# Patient Record
Sex: Male | Born: 1935 | Race: Black or African American | Hispanic: No | Marital: Single | Smoking: Former smoker
Health system: Southern US, Community
[De-identification: ages and names within clinical notes are randomized; demographics above are authoritative.]

## PROBLEM LIST (undated history)

## (undated) DIAGNOSIS — N189 Chronic kidney disease, unspecified: Secondary | ICD-10-CM

## (undated) DIAGNOSIS — I2510 Atherosclerotic heart disease of native coronary artery without angina pectoris: Secondary | ICD-10-CM

## (undated) DIAGNOSIS — R0602 Shortness of breath: Secondary | ICD-10-CM

## (undated) DIAGNOSIS — M109 Gout, unspecified: Secondary | ICD-10-CM

## (undated) DIAGNOSIS — R32 Unspecified urinary incontinence: Secondary | ICD-10-CM

## (undated) DIAGNOSIS — E119 Type 2 diabetes mellitus without complications: Secondary | ICD-10-CM

## (undated) DIAGNOSIS — J439 Emphysema, unspecified: Secondary | ICD-10-CM

## (undated) DIAGNOSIS — Z9981 Dependence on supplemental oxygen: Secondary | ICD-10-CM

## (undated) DIAGNOSIS — H409 Unspecified glaucoma: Secondary | ICD-10-CM

## (undated) DIAGNOSIS — I499 Cardiac arrhythmia, unspecified: Secondary | ICD-10-CM

## (undated) DIAGNOSIS — M1990 Unspecified osteoarthritis, unspecified site: Secondary | ICD-10-CM

## (undated) DIAGNOSIS — F1090 Alcohol use, unspecified, uncomplicated: Secondary | ICD-10-CM

## (undated) DIAGNOSIS — F0390 Unspecified dementia without behavioral disturbance: Secondary | ICD-10-CM

## (undated) DIAGNOSIS — Z7289 Other problems related to lifestyle: Secondary | ICD-10-CM

## (undated) DIAGNOSIS — N390 Urinary tract infection, site not specified: Secondary | ICD-10-CM

## (undated) DIAGNOSIS — I10 Essential (primary) hypertension: Secondary | ICD-10-CM

## (undated) DIAGNOSIS — E785 Hyperlipidemia, unspecified: Secondary | ICD-10-CM

## (undated) DIAGNOSIS — J189 Pneumonia, unspecified organism: Secondary | ICD-10-CM

## (undated) HISTORY — DX: Essential (primary) hypertension: I10

## (undated) HISTORY — DX: Unspecified osteoarthritis, unspecified site: M19.90

## (undated) HISTORY — DX: Emphysema, unspecified: J43.9

## (undated) HISTORY — DX: Other problems related to lifestyle: Z72.89

## (undated) HISTORY — DX: Alcohol use, unspecified, uncomplicated: F10.90

## (undated) HISTORY — DX: Unspecified urinary incontinence: R32

## (undated) HISTORY — DX: Hyperlipidemia, unspecified: E78.5

---

## 1992-05-31 HISTORY — PX: CORONARY ARTERY BYPASS GRAFT: SHX141

## 2011-01-07 ENCOUNTER — Emergency Department (HOSPITAL_COMMUNITY): Payer: MEDICARE

## 2011-01-07 ENCOUNTER — Emergency Department (HOSPITAL_COMMUNITY)
Admission: EM | Admit: 2011-01-07 | Discharge: 2011-01-07 | Disposition: A | Discharge status: Home / Self Care | Payer: MEDICARE | Attending: Emergency Medicine | Admitting: Emergency Medicine

## 2011-01-07 DIAGNOSIS — R29810 Facial weakness: Secondary | ICD-10-CM | POA: Insufficient documentation

## 2011-01-07 DIAGNOSIS — F0280 Dementia in other diseases classified elsewhere without behavioral disturbance: Secondary | ICD-10-CM | POA: Insufficient documentation

## 2011-01-07 DIAGNOSIS — E119 Type 2 diabetes mellitus without complications: Secondary | ICD-10-CM | POA: Insufficient documentation

## 2011-01-07 DIAGNOSIS — Z79899 Other long term (current) drug therapy: Secondary | ICD-10-CM | POA: Insufficient documentation

## 2011-01-07 DIAGNOSIS — R059 Cough, unspecified: Secondary | ICD-10-CM | POA: Insufficient documentation

## 2011-01-07 DIAGNOSIS — R05 Cough: Secondary | ICD-10-CM | POA: Insufficient documentation

## 2011-01-07 DIAGNOSIS — G309 Alzheimer's disease, unspecified: Secondary | ICD-10-CM | POA: Insufficient documentation

## 2011-01-07 DIAGNOSIS — I10 Essential (primary) hypertension: Secondary | ICD-10-CM | POA: Insufficient documentation

## 2011-01-07 DIAGNOSIS — I2510 Atherosclerotic heart disease of native coronary artery without angina pectoris: Secondary | ICD-10-CM | POA: Insufficient documentation

## 2011-01-07 DIAGNOSIS — G510 Bell's palsy: Secondary | ICD-10-CM | POA: Insufficient documentation

## 2011-01-07 LAB — DIFFERENTIAL
Basophils Relative: 0 % (ref 0–1)
Eosinophils Relative: 1 % (ref 0–5)
Monocytes Absolute: 1 10*3/uL (ref 0.1–1.0)
Monocytes Relative: 12 % (ref 3–12)
Neutro Abs: 5.5 10*3/uL (ref 1.7–7.7)

## 2011-01-07 LAB — POCT I-STAT, CHEM 8
Creatinine, Ser: 1.9 mg/dL — ABNORMAL HIGH (ref 0.50–1.35)
Glucose, Bld: 92 mg/dL (ref 70–99)
HCT: 37 % — ABNORMAL LOW (ref 39.0–52.0)
Hemoglobin: 12.6 g/dL — ABNORMAL LOW (ref 13.0–17.0)
Sodium: 137 mEq/L (ref 135–145)
TCO2: 23 mmol/L (ref 0–100)

## 2011-01-07 LAB — CBC
Hemoglobin: 11.2 g/dL — ABNORMAL LOW (ref 13.0–17.0)
MCH: 29.8 pg (ref 26.0–34.0)
MCHC: 33 g/dL (ref 30.0–36.0)
RDW: 14.3 % (ref 11.5–15.5)

## 2011-02-14 ENCOUNTER — Emergency Department (HOSPITAL_COMMUNITY): Payer: MEDICARE

## 2011-02-14 ENCOUNTER — Emergency Department (HOSPITAL_COMMUNITY)
Admission: EM | Admit: 2011-02-14 | Discharge: 2011-02-15 | Disposition: A | Discharge status: Home / Self Care | Payer: MEDICARE | Attending: Emergency Medicine | Admitting: Emergency Medicine

## 2011-02-14 DIAGNOSIS — I517 Cardiomegaly: Secondary | ICD-10-CM | POA: Insufficient documentation

## 2011-02-14 DIAGNOSIS — Z951 Presence of aortocoronary bypass graft: Secondary | ICD-10-CM | POA: Insufficient documentation

## 2011-02-14 DIAGNOSIS — E119 Type 2 diabetes mellitus without complications: Secondary | ICD-10-CM | POA: Insufficient documentation

## 2011-02-14 DIAGNOSIS — G309 Alzheimer's disease, unspecified: Secondary | ICD-10-CM | POA: Insufficient documentation

## 2011-02-14 DIAGNOSIS — I2510 Atherosclerotic heart disease of native coronary artery without angina pectoris: Secondary | ICD-10-CM | POA: Insufficient documentation

## 2011-02-14 DIAGNOSIS — F0280 Dementia in other diseases classified elsewhere without behavioral disturbance: Secondary | ICD-10-CM | POA: Insufficient documentation

## 2011-02-14 DIAGNOSIS — R062 Wheezing: Secondary | ICD-10-CM | POA: Insufficient documentation

## 2011-02-14 DIAGNOSIS — J42 Unspecified chronic bronchitis: Secondary | ICD-10-CM | POA: Insufficient documentation

## 2011-02-14 DIAGNOSIS — M7989 Other specified soft tissue disorders: Secondary | ICD-10-CM | POA: Insufficient documentation

## 2011-02-14 DIAGNOSIS — I10 Essential (primary) hypertension: Secondary | ICD-10-CM | POA: Insufficient documentation

## 2011-02-14 LAB — BASIC METABOLIC PANEL
CO2: 24 mEq/L (ref 19–32)
Chloride: 100 mEq/L (ref 96–112)
GFR calc Af Amer: >60 mL/min (ref >60)
Potassium: 4.2 mEq/L (ref 3.5–5.1)
Sodium: 134 mEq/L — ABNORMAL LOW (ref 135–145)

## 2011-02-14 LAB — DIFFERENTIAL
Basophils Absolute: 0.1 10*3/uL (ref 0.0–0.1)
Basophils Relative: 1 % (ref 0–1)
Eosinophils Absolute: 0.7 10*3/uL (ref 0.0–0.7)
Eosinophils Relative: 12 % — ABNORMAL HIGH (ref 0–5)
Lymphocytes Relative: 31 % (ref 12–46)
Monocytes Absolute: 0.6 10*3/uL (ref 0.1–1.0)
Monocytes Relative: 10 % (ref 3–12)
Neutro Abs: 2.8 10*3/uL (ref 1.7–7.7)

## 2011-02-14 LAB — CBC
Platelets: 221 10*3/uL (ref 150–400)
RBC: 3.57 MIL/uL — ABNORMAL LOW (ref 4.22–5.81)
RDW: 15 % (ref 11.5–15.5)
WBC: 6.1 10*3/uL (ref 4.0–10.5)

## 2011-02-14 LAB — PRO B NATRIURETIC PEPTIDE: Pro B Natriuretic peptide (BNP): 269 pg/mL (ref 0–450)

## 2011-02-27 ENCOUNTER — Inpatient Hospital Stay (INDEPENDENT_AMBULATORY_CARE_PROVIDER_SITE_OTHER)
Admission: RE | Admit: 2011-02-27 | Discharge: 2011-02-27 | Disposition: A | Discharge status: Home / Self Care | Payer: BC Managed Care – HMO | Source: Ambulatory Visit | Attending: Family Medicine | Admitting: Family Medicine

## 2011-02-27 DIAGNOSIS — J42 Unspecified chronic bronchitis: Secondary | ICD-10-CM

## 2011-04-30 ENCOUNTER — Ambulatory Visit (INDEPENDENT_AMBULATORY_CARE_PROVIDER_SITE_OTHER): Payer: BC Managed Care – HMO | Admitting: Family Medicine

## 2011-04-30 ENCOUNTER — Encounter: Payer: Self-pay | Admitting: Family Medicine

## 2011-04-30 DIAGNOSIS — I10 Essential (primary) hypertension: Secondary | ICD-10-CM

## 2011-04-30 DIAGNOSIS — E785 Hyperlipidemia, unspecified: Secondary | ICD-10-CM

## 2011-04-30 DIAGNOSIS — F0390 Unspecified dementia without behavioral disturbance: Secondary | ICD-10-CM

## 2011-04-30 DIAGNOSIS — I2510 Atherosclerotic heart disease of native coronary artery without angina pectoris: Secondary | ICD-10-CM

## 2011-04-30 DIAGNOSIS — M109 Gout, unspecified: Secondary | ICD-10-CM

## 2011-04-30 DIAGNOSIS — E119 Type 2 diabetes mellitus without complications: Secondary | ICD-10-CM

## 2011-04-30 MED ORDER — DONEPEZIL HCL 5 MG PO TABS: 5.0000 mg | ORAL_TABLET | Freq: Every day | ORAL | Status: DC

## 2011-04-30 NOTE — Progress Notes (Signed)
  Subjective:    Patient ID: Lucas Obrien, male    DOB: 1924/11/22, 77 y.o.   MRN: [id number]  HPI New to establish.  Previous MD- [facility] (fax [phone])  Hyperlipidemia- chronic problem, on Zocor 10mg .  Labs last checked in August.  No abd pain, N/V, myalgias.  Diabetes- chronic problem, on Metformin.  Last A1C 1 month ago.  Not checking sugars at home.  Denies symptomatic lows- no dizziness, shaky feelings.  No CP, SOB, HAs, visual changes, edema.  HTN- chronic problem, on Norvasc, Lopressor, Diovan HCT.  Well controlled today.  Asymptomatic.  CAD- s/p bypass in 1994.  Did not see Cards in [place].  Gout- chronic problem, on Allopurinol daily.  Rarely having flares.  Dementia- started on Namenda 1-2 months ago.  Son has noticed some improvement in memory.  This is why pt moved down here.  Had 1 episode of wandering- ended up in [place].  No longer driving.   Review of Systems For ROS see HPI     Objective:   Physical Exam  Vitals reviewed. Constitutional: He appears well-developed and well-nourished. No distress.  HENT:  Head: Normocephalic and atraumatic.  Eyes: Conjunctivae and EOM are normal. Pupils are equal, round, and reactive to light.  Neck: Normal range of motion. Neck supple. No thyromegaly present.  Cardiovascular: Normal rate, regular rhythm, normal heart sounds and intact distal pulses.   Pulmonary/Chest: Effort normal and breath sounds normal. No respiratory distress.  Abdominal: Soft. Bowel sounds are normal. He exhibits no distension.  Musculoskeletal: He exhibits no edema.  Lymphadenopathy:    He has no cervical adenopathy.  Neurological: He is alert. No cranial nerve deficit. Coordination normal.  Skin: Skin is warm and dry.  Psychiatric: He has a normal mood and affect. His behavior is normal.          Assessment & Plan:

## 2011-04-30 NOTE — Patient Instructions (Signed)
Follow up in 2 months to recheck diabetes and cholesterol We'll review your records and call you if there are any changes Start the Aricept daily for the memory Call with any questions or concerns Welcome!  We're glad to have you!!! Happy Holidays!!!

## 2011-05-07 ENCOUNTER — Telehealth: Payer: Self-pay | Admitting: Family Medicine

## 2011-05-09 DIAGNOSIS — E785 Hyperlipidemia, unspecified: Secondary | ICD-10-CM | POA: Insufficient documentation

## 2011-05-09 DIAGNOSIS — I10 Essential (primary) hypertension: Secondary | ICD-10-CM | POA: Insufficient documentation

## 2011-05-09 DIAGNOSIS — F0390 Unspecified dementia without behavioral disturbance: Secondary | ICD-10-CM | POA: Insufficient documentation

## 2011-05-09 DIAGNOSIS — M109 Gout, unspecified: Secondary | ICD-10-CM | POA: Insufficient documentation

## 2011-05-09 DIAGNOSIS — E1129 Type 2 diabetes mellitus with other diabetic kidney complication: Secondary | ICD-10-CM | POA: Insufficient documentation

## 2011-05-09 DIAGNOSIS — I2510 Atherosclerotic heart disease of native coronary artery without angina pectoris: Secondary | ICD-10-CM | POA: Insufficient documentation

## 2011-05-09 NOTE — Assessment & Plan Note (Signed)
Chronic problem.  On Allopurinol daily as preventative med.  Reports infrequent flares.

## 2011-05-09 NOTE — Assessment & Plan Note (Signed)
Relatively new problem for pt.  Recently started on Aricept and Namenda.  Episode of wandering is what prompted move.  Pt is no longer driving.  wil follow closely.

## 2011-05-09 NOTE — Assessment & Plan Note (Signed)
Chronic problem.  Pt had not been seeing cards in [place].  Will await records from previous PCP and determine whether pt needs referral.

## 2011-05-09 NOTE — Assessment & Plan Note (Signed)
Chronic problem.  Adequate control today.  Asymptomatic.  No changes to med regimen.

## 2011-05-09 NOTE — Assessment & Plan Note (Signed)
Chronic problem for pt.  On statin.  Tolerating w/out difficulty.  Will get labs at next appt and await records for review.

## 2011-05-09 NOTE — Assessment & Plan Note (Signed)
Family reports labs done 1 month ago.  Uncertain as to level of control.  Will need to get labs and adjust meds if needed.

## 2011-05-11 NOTE — Telephone Encounter (Signed)
i need problem list, med list, records of any colonoscopy, labs for the last year, EKG tracing, any pertinent imaging, and office notes for last year.

## 2011-05-11 NOTE — Telephone Encounter (Signed)
Last OV 04-30-11.

## 2011-05-12 NOTE — Telephone Encounter (Signed)
Called office to speak with fran, noted after hours took my message of information needed to be faxed for pt.advised to call office if any questions noted

## 2011-05-19 ENCOUNTER — Ambulatory Visit (INDEPENDENT_AMBULATORY_CARE_PROVIDER_SITE_OTHER): Payer: BC Managed Care – HMO | Admitting: Family Medicine

## 2011-05-19 ENCOUNTER — Encounter: Payer: Self-pay | Admitting: Family Medicine

## 2011-05-19 DIAGNOSIS — R0982 Postnasal drip: Secondary | ICD-10-CM | POA: Insufficient documentation

## 2011-05-19 NOTE — Progress Notes (Signed)
  Subjective:    Patient ID: Lucas Obrien, male    DOB: 01-Mar-1925, 77 y.o.   MRN: [id number]  HPI Need to clear throat- reports constant need to clear throat and cough.  Sputum is white.  No fevers.  sxs started 1 week ago.  + nasal congestion.  No sinus pain.  No ear pain.  + PND.  Using prescription nasal steroid.   Review of Systems For ROS see HPI     Objective:   Physical Exam  Vitals reviewed. Constitutional: He appears well-developed and well-nourished. No distress.  HENT:  Head: Normocephalic and atraumatic.       No TTP over sinuses + turbinate edema + PND TMs normal bilaterally  Eyes: Conjunctivae and EOM are normal. Pupils are equal, round, and reactive to light.  Neck: Normal range of motion. Neck supple.  Cardiovascular: Normal rate, regular rhythm and normal heart sounds.   Pulmonary/Chest: Effort normal and breath sounds normal. No respiratory distress. He has no wheezes.  Lymphadenopathy:    He has no cervical adenopathy.  Skin: Skin is warm and dry.          Assessment & Plan:

## 2011-05-19 NOTE — Patient Instructions (Signed)
Your cough and need to clear your throat is due to your post-nasal drip Continue the nasal spray- 2 sprays each nostril Drink plenty of fluids Add Claritin or Zyrtec daily to decrease congestion Start Mucinex to thin your congestion and make it easier to cough up Call with any questions or concerns Hang in there! Happy Holidays!

## 2011-06-06 NOTE — Assessment & Plan Note (Signed)
No evidence of infxn.  Pt's sxs and PE most consistent w/ PND.  Continue nasal steroid spray.  Add OTC antihistamine.  Reviewed supportive care and red flags that should prompt return.  Pt and son expressed understanding and are in agreement w/ plan.

## 2011-06-08 ENCOUNTER — Telehealth: Payer: Self-pay

## 2011-06-08 NOTE — Telephone Encounter (Signed)
Triage VM:  Pt called requesting appt. tomorrow for wheezing and cold symptoms.  Pt advised to call in the morning to make appt for same day appt.

## 2011-06-09 ENCOUNTER — Encounter: Payer: Self-pay | Admitting: Internal Medicine

## 2011-06-09 ENCOUNTER — Ambulatory Visit (INDEPENDENT_AMBULATORY_CARE_PROVIDER_SITE_OTHER): Payer: BC Managed Care – HMO | Admitting: Internal Medicine

## 2011-06-09 DIAGNOSIS — J209 Acute bronchitis, unspecified: Secondary | ICD-10-CM

## 2011-06-09 DIAGNOSIS — E119 Type 2 diabetes mellitus without complications: Secondary | ICD-10-CM

## 2011-06-09 LAB — HEMOGLOBIN A1C: Hgb A1c MFr Bld: 6.7 % — ABNORMAL HIGH (ref 4.6–6.5)

## 2011-06-09 MED ORDER — AZITHROMYCIN 250 MG PO TABS: ORAL_TABLET | ORAL | Status: AC

## 2011-06-09 MED ORDER — HYDROCODONE-HOMATROPINE 5-1.5 MG/5ML PO SYRP: 5.0000 mL | ORAL_SOLUTION | Freq: Four times a day (QID) | ORAL | Status: AC | PRN

## 2011-06-09 NOTE — Patient Instructions (Signed)
Goals for home glucose monitoring are : fasting  or morning glucose goal of  90-150. Two hours after any meal , goal = < 180, preferably < 160. Report any low blood glucoses immediately.  

## 2011-06-09 NOTE — Progress Notes (Signed)
  Subjective:    Patient ID: Lucas Obrien, male    DOB: [birth year], 78 y.o.   MRN: [id number]  HPI Respiratory tract infection Onset/symptoms:as dry cough 1/3 Exposures (illness/environmental/extrinsic):son ill Progression of symptoms:rhinitis & persistent cough Treatments/response:Cheratussin , nasal spray Present symptoms: Fever/chills/sweats:no Frontal headache:no Facial pain:no Nasal purulence:no Sore throat:no Wheezing/shortness of breath:wheezing 1/8, better with Symbicort Cough/sputum/hemoptysis:no sputum or blood  Past medical history: bronchitis Smoking history:quit in 1970s           Review of Systems glucoses are not be monitored at home; the last glucose his son remembers was 120 in December     Objective:   Physical Exam General appearance:well nourished; no acute distress or increased work of breathing is present.  No  lymphadenopathy about the head, neck, or axilla noted.   Eyes: No conjunctival inflammation or lid edema is present. .  Ears:  External ear exam shows no significant lesions or deformities.  Otoscopic examination reveals wax bilaterally.  Nose:  External nasal examination shows no deformity or inflammation. Nasal mucosa are pink and moist without lesions or exudates. No septal dislocation or deviation.No obstruction to airflow.   Oral exam: Dental caries present ; lips and gums are healthy appearing.There is no oropharyngeal erythema or exudate noted.      Heart:  Normal rate and regular rhythm. S1 and S2 normal without gallop, murmur, click, rub S 4 Lungs: Coarse low-grade rhonchi are noted diffusely; no rubs present Extremities:  No cyanosis or clubbing  noted . Trace edema at the sock line   Skin: Warm & dry        Assessment & Plan:  #1 bronchitis, asthmatic  #2 diabetes questionable status.  Plan/discussion: Steroids should be avoided until diabetic status can be determined. Symbicort will be increased to 2 puffs every 12  hours. Antibiotics and cough syrup will be prescribed.

## 2011-06-30 ENCOUNTER — Encounter: Payer: Self-pay | Admitting: Family Medicine

## 2011-06-30 ENCOUNTER — Ambulatory Visit (INDEPENDENT_AMBULATORY_CARE_PROVIDER_SITE_OTHER): Payer: Self-pay | Admitting: Family Medicine

## 2011-06-30 DIAGNOSIS — E119 Type 2 diabetes mellitus without complications: Secondary | ICD-10-CM

## 2011-06-30 DIAGNOSIS — E785 Hyperlipidemia, unspecified: Secondary | ICD-10-CM

## 2011-06-30 DIAGNOSIS — I10 Essential (primary) hypertension: Secondary | ICD-10-CM

## 2011-06-30 LAB — LIPID PANEL
Cholesterol: 140 mg/dL (ref 0–200)
HDL: 52.9 mg/dL (ref >39.00)
LDL Cholesterol: 73 mg/dL (ref 0–99)
VLDL: 14 mg/dL (ref 0.0–40.0)

## 2011-06-30 LAB — HEPATIC FUNCTION PANEL
AST: 28 U/L (ref 0–37)
Albumin: 4.1 g/dL (ref 3.5–5.2)
Alkaline Phosphatase: 46 U/L (ref 39–117)
Total Bilirubin: 0.3 mg/dL (ref 0.3–1.2)

## 2011-06-30 LAB — HEMOGLOBIN A1C: Hgb A1c MFr Bld: 6.7 % — ABNORMAL HIGH (ref 4.6–6.5)

## 2011-06-30 LAB — BASIC METABOLIC PANEL
Chloride: 109 mEq/L (ref 96–112)
GFR: 58.19 mL/min — ABNORMAL LOW (ref >60.00)
Potassium: 4.9 mEq/L (ref 3.5–5.1)

## 2011-06-30 NOTE — Progress Notes (Signed)
  Subjective:    Patient ID: Lucas Obrien, male    DOB: 1925-04-15, 78 y.o.   MRN: [id number]  HPI DM- chronic problem, on Metformin.  Not checking CBGs.  UTD on eye exam.  No numbness or tingling of hands or feet.  No symptomatic lows- no shaking or dizziness.  HTN- chronic problem, well controlled today.  On metoprolol, Diovan HCT, amlodipine.  No CP, SOB, HAs, visual changes, edema.  Hyperlipidemia- chronic problem, on Zocor.  No abd pain, N/V, myalgias.   Review of Systems For ROS see HPI     Objective:   Physical Exam  Vitals reviewed. Constitutional: He is oriented to person, place, and time. He appears well-developed and well-nourished. No distress.  HENT:  Head: Normocephalic and atraumatic.  Eyes: Conjunctivae and EOM are normal. Pupils are equal, round, and reactive to light.  Neck: Normal range of motion. Neck supple. No thyromegaly present.  Cardiovascular: Normal rate, regular rhythm, normal heart sounds and intact distal pulses.   No murmur heard. Pulmonary/Chest: Effort normal and breath sounds normal. No respiratory distress.  Abdominal: Soft. Bowel sounds are normal. He exhibits no distension.  Musculoskeletal: He exhibits no edema.  Lymphadenopathy:    He has no cervical adenopathy.  Neurological: He is alert and oriented to person, place, and time. No cranial nerve deficit.  Skin: Skin is warm and dry.  Psychiatric: He has a normal mood and affect. His behavior is normal.          Assessment & Plan:

## 2011-06-30 NOTE — Assessment & Plan Note (Signed)
Chronic problem.  Tolerating statin w/out difficulty.  Check labs.  Adjust meds prn  

## 2011-06-30 NOTE — Patient Instructions (Signed)
Follow up in 3 months to recheck diabetes We'll notify you of your lab results and make any changes if needed Call with any questions or concerns Hang in there! 

## 2011-06-30 NOTE — Assessment & Plan Note (Signed)
Chronic problem, well controlled today.  Asymptomatic.  No changes. 

## 2011-06-30 NOTE — Assessment & Plan Note (Addendum)
Chronic problem.  Not checking sugars.  Previously well controlled.  Will get labs and adjust meds prn.  UTD on eye exam.

## 2011-07-14 ENCOUNTER — Other Ambulatory Visit: Payer: Self-pay | Admitting: Family Medicine

## 2011-07-14 MED ORDER — GLUCOSE BLOOD VI STRP: ORAL_STRIP | Status: DC

## 2011-07-14 MED ORDER — DONEPEZIL HCL 5 MG PO TABS: 5.0000 mg | ORAL_TABLET | Freq: Every day | ORAL | Status: DC

## 2011-07-14 NOTE — Telephone Encounter (Signed)
rx sent to pharmacy by e-script  

## 2011-07-14 NOTE — Telephone Encounter (Signed)
The pt is requesting a refill of the Accu-chek Aviva plus test strips sent to the [facility] on [address]  Also, the pt is requesting a refill of Donepezil HCL 5mg  tabs   Thanks!

## 2011-07-19 ENCOUNTER — Other Ambulatory Visit: Payer: Self-pay | Admitting: *Deleted

## 2011-07-19 MED ORDER — ACCU-CHEK MULTICLIX LANCETS MISC: Status: DC

## 2011-07-19 NOTE — Telephone Encounter (Signed)
Raynelle Fanning from [facility] called to advise that pt request for accu-check lancets have not been called in, gave verbal order for Lancets (Accu-chek multiclix) to use as directed 100 each box with 3 refills. Pt is in pharmacy and aware of order

## 2011-07-22 ENCOUNTER — Telehealth: Payer: Self-pay | Admitting: Family Medicine

## 2011-07-22 NOTE — Telephone Encounter (Signed)
Spoke with staff to clarify pt son came in to state that pt insurances does not start til next month and the symbicort is too expensive, pt request for samples, note pt medication as symbicort 160 4-5, placed a sample at front desk for pick up, called pt to advise the sample is ready, noted son answered phone and he will come to pick up the sample

## 2011-09-06 ENCOUNTER — Telehealth: Payer: Self-pay | Admitting: Family Medicine

## 2011-09-06 MED ORDER — ACCU-CHEK MULTICLIX LANCETS MISC: Status: DC

## 2011-09-06 NOTE — Telephone Encounter (Signed)
rx sent to pharmacy by e-script  

## 2011-09-06 NOTE — Telephone Encounter (Signed)
Refill- accu chek multiclix lancet. Test once a day. Qty 102 last fill 5.5.10

## 2011-09-10 ENCOUNTER — Telehealth: Payer: Self-pay | Admitting: Family Medicine

## 2011-09-10 NOTE — Telephone Encounter (Signed)
Patient's son walked-in to get samples for Symbicort to last through the weekend. Pt's son filled out Walk-In Patient form and Tresa Moore was notified. Kim pulled the samples and I gave them to the pt's son.

## 2011-09-28 ENCOUNTER — Ambulatory Visit: Payer: Self-pay | Admitting: Family Medicine

## 2011-10-06 ENCOUNTER — Ambulatory Visit (INDEPENDENT_AMBULATORY_CARE_PROVIDER_SITE_OTHER): Payer: Medicare Other | Admitting: Family Medicine

## 2011-10-06 ENCOUNTER — Encounter: Payer: Self-pay | Admitting: Family Medicine

## 2011-10-06 VITALS — BP 110/64 | HR 60 | Temp 98.2°F | Ht 64.0 in | Wt 198.4 lb

## 2011-10-06 DIAGNOSIS — IMO0001 Reserved for inherently not codable concepts without codable children: Secondary | ICD-10-CM

## 2011-10-06 DIAGNOSIS — E785 Hyperlipidemia, unspecified: Secondary | ICD-10-CM

## 2011-10-06 DIAGNOSIS — E119 Type 2 diabetes mellitus without complications: Secondary | ICD-10-CM

## 2011-10-06 DIAGNOSIS — I10 Essential (primary) hypertension: Secondary | ICD-10-CM

## 2011-10-06 LAB — LIPID PANEL
HDL: 53.6 mg/dL (ref >39.00)
Triglycerides: 79 mg/dL (ref 0.0–149.0)
VLDL: 15.8 mg/dL (ref 0.0–40.0)

## 2011-10-06 LAB — HEPATIC FUNCTION PANEL
Albumin: 3.7 g/dL (ref 3.5–5.2)
Alkaline Phosphatase: 49 U/L (ref 39–117)
Bilirubin, Direct: 0 mg/dL (ref 0.0–0.3)

## 2011-10-06 LAB — BASIC METABOLIC PANEL
Calcium: 9.5 mg/dL (ref 8.4–10.5)
GFR: 53.54 mL/min — ABNORMAL LOW (ref >60.00)
Glucose, Bld: 114 mg/dL — ABNORMAL HIGH (ref 70–99)
Sodium: 138 mEq/L (ref 135–145)

## 2011-10-06 LAB — HEMOGLOBIN A1C: Hgb A1c MFr Bld: 6.1 % (ref 4.6–6.5)

## 2011-10-06 NOTE — Progress Notes (Signed)
  Subjective:    Patient ID: General Wearing, male    DOB: March 26, 1925, 78 y.o.   MRN: [id number]  HPI HTN- chronic problem, well controlled.  On Norvasc, metoprolol, and Diovan HCT.  No CP, SOB, HAs, visual changes, edema.  Hyperlipidemia- chronic problem, on Zocor.  Due for labs.  N/V, abd pain, myalgias.  DM- chronic problem, on Metformin.  Denies symptomatic lows.  CBGs running 90s-150s but mostly in the 110s.  UTD on eye exam.   Review of Systems For ROS see HPI     Objective:   Physical Exam  Vitals reviewed. Constitutional: He is oriented to person, place, and time. He appears well-developed and well-nourished. No distress.  HENT:  Head: Normocephalic and atraumatic.  Eyes: Conjunctivae and EOM are normal. Pupils are equal, round, and reactive to light.  Neck: Normal range of motion. Neck supple. No thyromegaly present.  Cardiovascular: Normal rate, regular rhythm, normal heart sounds and intact distal pulses.   No murmur heard. Pulmonary/Chest: Effort normal and breath sounds normal. No respiratory distress.  Abdominal: Soft. Bowel sounds are normal. He exhibits no distension.  Musculoskeletal: He exhibits no edema.  Lymphadenopathy:    He has no cervical adenopathy.  Neurological: He is alert and oriented to person, place, and time. No cranial nerve deficit.  Skin: Skin is warm and dry.  Psychiatric: He has a normal mood and affect. His behavior is normal.          Assessment & Plan:

## 2011-10-06 NOTE — Patient Instructions (Signed)
Schedule a follow up in 3-4 months to recheck diabetes (you can eat before this) You look great!  Keep up the good work! We'll notify you of your lab results and make any changes if needed Call with any questions or concerns Happy Spring!!!!

## 2011-10-08 ENCOUNTER — Encounter: Payer: Self-pay | Admitting: *Deleted

## 2011-10-12 NOTE — Assessment & Plan Note (Signed)
Chronic problem.  Tolerating meds w/out difficulty.  Check labs.  Adjust meds prn  

## 2011-10-12 NOTE — Assessment & Plan Note (Signed)
Chronic problem.  CBGs well controlled per pt log.  UTD on eye exam.  Denies symptomatic lows.  Check labs.  Adjust meds prn.

## 2011-10-12 NOTE — Assessment & Plan Note (Signed)
Chronic problem, well controlled.  Asymptomatic.  No changes. 

## 2011-10-14 ENCOUNTER — Other Ambulatory Visit: Payer: Self-pay | Admitting: Family Medicine

## 2011-10-14 MED ORDER — DONEPEZIL HCL 5 MG PO TABS: 5.0000 mg | ORAL_TABLET | Freq: Every day | ORAL | Status: DC

## 2011-10-14 NOTE — Telephone Encounter (Signed)
rx sent to pharmacy by e-script  

## 2011-10-14 NOTE — Telephone Encounter (Signed)
Refill Donepezil HCL 5MG  Tablet Qty 30 Take one tablet by mouth once daily at bedtime Last filled 4.27.13  Last ov 5.8.13

## 2011-10-21 ENCOUNTER — Other Ambulatory Visit: Payer: Self-pay | Admitting: Family Medicine

## 2011-10-21 MED ORDER — METFORMIN HCL 1000 MG PO TABS: 1000.0000 mg | ORAL_TABLET | Freq: Two times a day (BID) | ORAL | Status: DC

## 2011-10-21 NOTE — Telephone Encounter (Signed)
rx sent to pharmacy by e-script  

## 2011-12-08 ENCOUNTER — Encounter (HOSPITAL_COMMUNITY): Payer: Self-pay | Admitting: General Practice

## 2011-12-08 ENCOUNTER — Ambulatory Visit (INDEPENDENT_AMBULATORY_CARE_PROVIDER_SITE_OTHER): Payer: Medicare Other | Admitting: Family

## 2011-12-08 ENCOUNTER — Inpatient Hospital Stay (HOSPITAL_COMMUNITY)
Admission: AD | Admit: 2011-12-08 | Discharge: 2011-12-12 | DRG: 690 | Disposition: A | Discharge status: Home Health | Payer: Medicare Other | Source: Ambulatory Visit | Attending: Internal Medicine | Admitting: Internal Medicine

## 2011-12-08 ENCOUNTER — Telehealth: Payer: Self-pay | Admitting: *Deleted

## 2011-12-08 ENCOUNTER — Inpatient Hospital Stay (HOSPITAL_COMMUNITY): Payer: Medicare Other

## 2011-12-08 ENCOUNTER — Encounter: Payer: Self-pay | Admitting: Family

## 2011-12-08 VITALS — BP 126/56 | HR 68 | Temp 101.3°F | Resp 24

## 2011-12-08 DIAGNOSIS — I10 Essential (primary) hypertension: Secondary | ICD-10-CM | POA: Diagnosis present

## 2011-12-08 DIAGNOSIS — N390 Urinary tract infection, site not specified: Principal | ICD-10-CM | POA: Diagnosis present

## 2011-12-08 DIAGNOSIS — E119 Type 2 diabetes mellitus without complications: Secondary | ICD-10-CM | POA: Diagnosis present

## 2011-12-08 DIAGNOSIS — Z8249 Family history of ischemic heart disease and other diseases of the circulatory system: Secondary | ICD-10-CM

## 2011-12-08 DIAGNOSIS — R1310 Dysphagia, unspecified: Secondary | ICD-10-CM | POA: Diagnosis present

## 2011-12-08 DIAGNOSIS — Z833 Family history of diabetes mellitus: Secondary | ICD-10-CM

## 2011-12-08 DIAGNOSIS — Z823 Family history of stroke: Secondary | ICD-10-CM

## 2011-12-08 DIAGNOSIS — R509 Fever, unspecified: Secondary | ICD-10-CM | POA: Diagnosis present

## 2011-12-08 DIAGNOSIS — Z880 Allergy status to penicillin: Secondary | ICD-10-CM

## 2011-12-08 DIAGNOSIS — Z7982 Long term (current) use of aspirin: Secondary | ICD-10-CM

## 2011-12-08 DIAGNOSIS — Z79899 Other long term (current) drug therapy: Secondary | ICD-10-CM

## 2011-12-08 DIAGNOSIS — I2510 Atherosclerotic heart disease of native coronary artery without angina pectoris: Secondary | ICD-10-CM | POA: Diagnosis present

## 2011-12-08 DIAGNOSIS — J40 Bronchitis, not specified as acute or chronic: Secondary | ICD-10-CM | POA: Diagnosis present

## 2011-12-08 DIAGNOSIS — Z23 Encounter for immunization: Secondary | ICD-10-CM

## 2011-12-08 DIAGNOSIS — F0390 Unspecified dementia without behavioral disturbance: Secondary | ICD-10-CM | POA: Diagnosis present

## 2011-12-08 DIAGNOSIS — R4182 Altered mental status, unspecified: Secondary | ICD-10-CM | POA: Diagnosis present

## 2011-12-08 HISTORY — DX: Gout, unspecified: M10.9

## 2011-12-08 HISTORY — DX: Unspecified dementia, unspecified severity, without behavioral disturbance, psychotic disturbance, mood disturbance, and anxiety: F03.90

## 2011-12-08 HISTORY — DX: Chronic kidney disease, unspecified: N18.9

## 2011-12-08 HISTORY — DX: Type 2 diabetes mellitus without complications: E11.9

## 2011-12-08 HISTORY — DX: Urinary tract infection, site not specified: N39.0

## 2011-12-08 LAB — POCT URINALYSIS DIPSTICK
Bilirubin, UA: NEGATIVE
Glucose, UA: NEGATIVE
Spec Grav, UA: >=1.03
Urobilinogen, UA: 0.2

## 2011-12-08 LAB — DIFFERENTIAL
Eosinophils Absolute: 0 10*3/uL (ref 0.0–0.7)
Eosinophils Relative: 0 % (ref 0–5)
Lymphocytes Relative: 12 % (ref 12–46)
Lymphs Abs: 2.8 10*3/uL (ref 0.7–4.0)
Monocytes Absolute: 1.7 10*3/uL — ABNORMAL HIGH (ref 0.1–1.0)
Monocytes Relative: 8 % (ref 3–12)

## 2011-12-08 LAB — COMPREHENSIVE METABOLIC PANEL
AST: 22 U/L (ref 0–37)
CO2: 17 mEq/L — ABNORMAL LOW (ref 19–32)
Calcium: 10 mg/dL (ref 8.4–10.5)
Creatinine, Ser: 1.46 mg/dL — ABNORMAL HIGH (ref 0.50–1.35)
GFR calc non Af Amer: 42 mL/min — ABNORMAL LOW (ref >90)

## 2011-12-08 LAB — CBC
MCH: 30.7 pg (ref 26.0–34.0)
MCV: 91.4 fL (ref 78.0–100.0)
Platelets: 162 10*3/uL (ref 150–400)
RBC: 3.74 MIL/uL — ABNORMAL LOW (ref 4.22–5.81)
RDW: 15.4 % (ref 11.5–15.5)

## 2011-12-08 LAB — PHOSPHORUS: Phosphorus: 2.4 mg/dL (ref 2.3–4.6)

## 2011-12-08 LAB — GLUCOSE, CAPILLARY

## 2011-12-08 MED ORDER — ENOXAPARIN SODIUM 40 MG/0.4ML ~~LOC~~ SOLN
40.0000 mg | SUBCUTANEOUS | Status: DC
  Administered 2011-12-08 – 2011-12-12 (×4): 40 mg via SUBCUTANEOUS
  Filled 2011-12-08 (×5): qty 0.4

## 2011-12-08 MED ORDER — MEMANTINE HCL 10 MG PO TABS
10.0000 mg | ORAL_TABLET | Freq: Two times a day (BID) | ORAL | Status: DC
  Administered 2011-12-08 – 2011-12-12 (×8): 10 mg via ORAL
  Filled 2011-12-08 (×9): qty 1

## 2011-12-08 MED ORDER — SIMVASTATIN 10 MG PO TABS
10.0000 mg | ORAL_TABLET | Freq: Every day | ORAL | Status: DC
  Administered 2011-12-08 – 2011-12-12 (×4): 10 mg via ORAL
  Filled 2011-12-08 (×5): qty 1

## 2011-12-08 MED ORDER — IPRATROPIUM BROMIDE 0.06 % NA SOLN
1.0000 | Freq: Three times a day (TID) | NASAL | Status: DC
  Administered 2011-12-09 – 2011-12-12 (×6): 1 via NASAL
  Filled 2011-12-08: qty 15

## 2011-12-08 MED ORDER — VITAMIN B-1 100 MG PO TABS
100.0000 mg | ORAL_TABLET | Freq: Every day | ORAL | Status: DC
  Administered 2011-12-09 – 2011-12-12 (×4): 100 mg via ORAL
  Filled 2011-12-08 (×4): qty 1

## 2011-12-08 MED ORDER — LEVOFLOXACIN IN D5W 750 MG/150ML IV SOLN
750.0000 mg | INTRAVENOUS | Status: DC
Start: 2011-12-08 — End: 2011-12-11
  Administered 2011-12-08 – 2011-12-10 (×2): 750 mg via INTRAVENOUS
  Filled 2011-12-08 (×2): qty 150

## 2011-12-08 MED ORDER — ONDANSETRON HCL 4 MG PO TABS: 4.0000 mg | ORAL_TABLET | Freq: Four times a day (QID) | ORAL | Status: DC | PRN

## 2011-12-08 MED ORDER — ACETAMINOPHEN 325 MG PO TABS
650.0000 mg | ORAL_TABLET | Freq: Four times a day (QID) | ORAL | Status: DC | PRN
  Administered 2011-12-09 – 2011-12-12 (×3): 650 mg via ORAL
  Filled 2011-12-08 (×3): qty 2

## 2011-12-08 MED ORDER — DONEPEZIL HCL 5 MG PO TABS
5.0000 mg | ORAL_TABLET | Freq: Every day | ORAL | Status: DC
  Administered 2011-12-08 – 2011-12-12 (×4): 5 mg via ORAL
  Filled 2011-12-08 (×5): qty 1

## 2011-12-08 MED ORDER — ONDANSETRON HCL 4 MG/2ML IJ SOLN: 4.0000 mg | Freq: Four times a day (QID) | INTRAMUSCULAR | Status: DC | PRN

## 2011-12-08 MED ORDER — ASPIRIN 325 MG PO TABS
325.0000 mg | ORAL_TABLET | Freq: Every day | ORAL | Status: DC
  Administered 2011-12-09 – 2011-12-12 (×4): 325 mg via ORAL
  Filled 2011-12-08 (×4): qty 1

## 2011-12-08 MED ORDER — AMLODIPINE BESYLATE 5 MG PO TABS
5.0000 mg | ORAL_TABLET | Freq: Every day | ORAL | Status: DC
  Administered 2011-12-09 – 2011-12-12 (×4): 5 mg via ORAL
  Filled 2011-12-08 (×4): qty 1

## 2011-12-08 MED ORDER — INSULIN ASPART 100 UNIT/ML ~~LOC~~ SOLN
0.0000 [IU] | Freq: Three times a day (TID) | SUBCUTANEOUS | Status: DC
  Administered 2011-12-10 (×2): 1 [IU] via SUBCUTANEOUS
  Administered 2011-12-10: 3 [IU] via SUBCUTANEOUS
  Administered 2011-12-11 (×3): 2 [IU] via SUBCUTANEOUS

## 2011-12-08 MED ORDER — TAMSULOSIN HCL 0.4 MG PO CAPS
0.4000 mg | ORAL_CAPSULE | Freq: Every day | ORAL | Status: DC
  Administered 2011-12-09 – 2011-12-12 (×4): 0.4 mg via ORAL
  Filled 2011-12-08 (×4): qty 1

## 2011-12-08 MED ORDER — METOPROLOL TARTRATE 100 MG PO TABS
100.0000 mg | ORAL_TABLET | Freq: Two times a day (BID) | ORAL | Status: DC
  Administered 2011-12-08 – 2011-12-12 (×7): 100 mg via ORAL
  Filled 2011-12-08 (×9): qty 1

## 2011-12-08 MED ORDER — PNEUMOCOCCAL VAC POLYVALENT 25 MCG/0.5ML IJ INJ
0.5000 mL | INJECTION | INTRAMUSCULAR | Status: AC
  Administered 2011-12-09: 0.5 mL via INTRAMUSCULAR
  Filled 2011-12-08: qty 0.5

## 2011-12-08 MED ORDER — ACETAMINOPHEN 650 MG RE SUPP: 650.0000 mg | Freq: Four times a day (QID) | RECTAL | Status: DC | PRN

## 2011-12-08 MED ORDER — ALLOPURINOL 300 MG PO TABS
300.0000 mg | ORAL_TABLET | Freq: Every day | ORAL | Status: DC
  Administered 2011-12-09 – 2011-12-12 (×4): 300 mg via ORAL
  Filled 2011-12-08 (×4): qty 1

## 2011-12-08 MED ORDER — BUDESONIDE-FORMOTEROL FUMARATE 160-4.5 MCG/ACT IN AERO
2.0000 | INHALATION_SPRAY | Freq: Two times a day (BID) | RESPIRATORY_TRACT | Status: DC
  Administered 2011-12-08 – 2011-12-12 (×7): 2 via RESPIRATORY_TRACT
  Filled 2011-12-08: qty 6

## 2011-12-08 NOTE — Progress Notes (Signed)
Subjective:    Patient ID: Lucas Obrien, male    DOB: [birth year], 78 y.o.   MRN: [id number]  HPI  Mr.  Stillinger is an 78 yr old male who presents today with his son due to generalized weakness, confusion and urinary incontinence. Pt has hx of dementia and majority of his history is obtained from the son.  Per son, pt woke up at 11:30AM.  Needed help getting out of bed this morning which is unusual for him.  Son reported that he had trouble sitting up due to weakness and they had to assist him.  He then had an episode of urinary incontinence while trying to get to the restroom-This is also unusual for him.  Pocketed his breakfast, didn't swallow.  Son had to scoop food out of his mouth.  Son also reports Nasal drainage and Has unsteady gate.  Sugars 141.  No slurred speech, facial drooping.     Review of Systems See HPI  Past Medical History  Diagnosis Date  . Heart disease   . Hypertension   . Hyperlipidemia   . Urine incontinence   . Alcohol problem drinking   . Arthritis   . Diabetes mellitus   . Emphysema of lung     History   Social History  . Marital Status: Single    Spouse Name: N/A    Number of Children: N/A  . Years of Education: N/A   Occupational History  . Not on file.   Social History Main Topics  . Smoking status: Never Smoker   . Smokeless tobacco: Not on file  . Alcohol Use: No  . Drug Use: No  . Sexually Active: Not on file   Other Topics Concern  . Not on file   Social History Narrative  . No narrative on file    Past Surgical History  Procedure Date  . Open heart surgery 1994    Family History  Problem Relation Age of Onset  . Arthritis Mother   . Cancer Father   . Hyperlipidemia Father   . Heart disease Father   . Hypertension Father   . Diabetes Father   . Stroke Sister   . Stroke Brother     Allergies  Allergen Reactions  . Bee Venom   . Penicillins     Unknown reaction    Current Outpatient Prescriptions on File Prior  to Visit  Medication Sig Dispense Refill  . allopurinol (ZYLOPRIM) 300 MG tablet Take 300 mg by mouth daily.        Marland Kitchen amLODipine (NORVASC) 5 MG tablet Take 5 mg by mouth daily.        Marland Kitchen aspirin 325 MG tablet Take 325 mg by mouth daily.        . budesonide-formoterol (SYMBICORT) 160-4.5 MCG/ACT inhaler Inhale 2 puffs into the lungs 2 (two) times daily.        Marland Kitchen donepezil (ARICEPT) 5 MG tablet Take 1 tablet (5 mg total) by mouth at bedtime.  30 tablet  5  . glucose blood test strip 1 each by Other route 2 (two) times daily. Use as instructed       . glucose blood test strip Use as instructed  100 each  12  . ipratropium (ATROVENT) 0.06 % nasal spray       . Lancets (ACCU-CHEK MULTICLIX) lancets Use as directed  100 each  3  . meclizine (ANTIVERT) 25 MG tablet Take 25 mg by mouth 3 (three) times daily as needed.        Marland Kitchen  memantine (NAMENDA) 10 MG tablet Take 10 mg by mouth 2 (two) times daily.        . metFORMIN (GLUCOPHAGE) 1000 MG tablet Take 1 tablet (1,000 mg total) by mouth 2 (two) times daily with a meal.  60 tablet  3  . metoprolol (LOPRESSOR) 100 MG tablet Take 100 mg by mouth 2 (two) times daily.        . simvastatin (ZOCOR) 10 MG tablet Take 10 mg by mouth at bedtime.        . Tamsulosin HCl (FLOMAX) 0.4 MG CAPS Take 0.4 mg by mouth daily.        . valsartan-hydrochlorothiazide (DIOVAN-HCT) 320-25 MG per tablet Take 1 tablet by mouth daily.          BP 126/56  Pulse 68  Temp 101.3 F (38.5 C) (Oral)  Resp 24  SpO2 94%       Objective:   Physical Exam  Constitutional:       Elderly AA male seated in wheelchair.  HENT:  Head: Normocephalic and atraumatic.  Eyes: No scleral icterus.  Cardiovascular: Normal rate and regular rhythm.   No murmur heard. Pulmonary/Chest: Effort normal.       Coarse bilateral rhonchi without wheezing.   Abdominal: Soft. Bowel sounds are normal. He exhibits no distension and no mass. There is no tenderness. There is no rebound and no guarding.    Musculoskeletal: He exhibits no edema.  Neurological: He is alert.       Seems confused.  Bilateral upper ext strength 4-5/5, bilateral LE strength is 4-5/5. + facial symmetry.    Skin: Skin is warm and dry.  Psychiatric: He has a normal mood and affect.          Assessment & Plan:

## 2011-12-08 NOTE — Assessment & Plan Note (Signed)
UA reviewed- moderate blood, moderate leuks.  He is febrile, weak and has altered mental status.  Will plan to admit for blood cultures/IV abx.  Spoke with Dr. Irene Limbo.

## 2011-12-08 NOTE — H&P (Signed)
[facility] History and Physical  Lucas Obrien ZOX:[id number] DOB: 1925/02/23 DOA: 12/08/2011  PCP: Neena Rhymes, MD   Chief Complaint: sent from office for uti, altered mental status.   HPI:  Most of the history was available from the son at bedside.  As per the son, patient had difficulty getting out of bed this morning. At breakfast he failed to swallow and his son had to scoop out the food from his mouth. He had urinary incontinence this morning and as per the son, this is unusual for him. He was found to be febrile this morning at doctors office and a urine sample was abnormal. As per the son, he has dementia and he seems to be more confused today when compared to yesterday. He ambulates without a cane at home and he walked to the hospital from the car without any assistance. Son also reports chronic cough, mild, productive. No nausea or vomiting or diarrhea. No other complaints.    Review of Systems:  Limited ROS, as pt has dementia.    Past Medical History  Diagnosis Date  . Heart disease   . Hypertension   . Hyperlipidemia   . Urine incontinence   . Alcohol problem drinking   . Arthritis   . Diabetes mellitus   . Emphysema of lung    Past Surgical History  Procedure Date  . Open heart surgery 1994   Social History:  reports that he has never smoked. He does not have any smokeless tobacco history on file. He reports that he does not drink alcohol or use illicit drugs.  Allergies  Allergen Reactions  . Bee Venom     Unknown  . Penicillins     Unknown reaction    Family History  Problem Relation Age of Onset  . Arthritis Mother   . Cancer Father   . Hyperlipidemia Father   . Heart disease Father   . Hypertension Father   . Diabetes Father   . Stroke Sister   . Stroke Brother     Prior to Admission medications   Medication Sig Start Date End Date Taking? Authorizing Provider  allopurinol (ZYLOPRIM) 300 MG tablet Take 300 mg by mouth daily.      Yes Historical Provider, MD  amLODipine (NORVASC) 5 MG tablet Take 5 mg by mouth daily.     Yes Historical Provider, MD  aspirin 325 MG tablet Take 325 mg by mouth daily.     Yes Historical Provider, MD  budesonide-formoterol (SYMBICORT) 160-4.5 MCG/ACT inhaler Inhale 2 puffs into the lungs 2 (two) times daily.     Yes Historical Provider, MD  donepezil (ARICEPT) 5 MG tablet Take 1 tablet (5 mg total) by mouth at bedtime. 10/14/11 10/13/12 Yes Sheliah Hatch, MD  glucose blood test strip 1 each by Other route 2 (two) times daily. Use as instructed    Yes Historical Provider, MD  glucose blood test strip Use as instructed 07/14/11 07/13/12 Yes Sheliah Hatch, MD  ipratropium (ATROVENT) 0.06 % nasal spray Place 1 spray into the nose 3 (three) times daily.  07/30/11  Yes Historical Provider, MD  Lancets (ACCU-CHEK MULTICLIX) lancets Use as directed 09/06/11  Yes Sheliah Hatch, MD  meclizine (ANTIVERT) 25 MG tablet Take 25 mg by mouth 3 (three) times daily as needed. For dizziness   Yes Historical Provider, MD  memantine (NAMENDA) 10 MG tablet Take 10 mg by mouth 2 (two) times daily.     Yes Historical Provider, MD  metFORMIN (  GLUCOPHAGE) 1000 MG tablet Take 1 tablet (1,000 mg total) by mouth 2 (two) times daily with a meal. 10/21/11  Yes Sheliah Hatch, MD  metoprolol (LOPRESSOR) 100 MG tablet Take 100 mg by mouth 2 (two) times daily.     Yes Historical Provider, MD  simvastatin (ZOCOR) 10 MG tablet Take 10 mg by mouth at bedtime.     Yes Historical Provider, MD  Tamsulosin HCl (FLOMAX) 0.4 MG CAPS Take 0.4 mg by mouth daily.     Yes Historical Provider, MD  thiamine (VITAMIN B-1) 100 MG tablet Take 100 mg by mouth daily.   Yes Historical Provider, MD  valsartan-hydrochlorothiazide (DIOVAN-HCT) 320-25 MG per tablet Take 1 tablet by mouth daily.     Yes Historical Provider, MD   Physical Exam: Filed Vitals:   12/08/11 1726  BP: 135/51  Pulse: 64  Temp: 99.2 F (37.3 C)  Resp: 22  SpO2:  98%    Constitutional: Vital signs reviewed.  Patient is a well-developed and well-nourished in no acute distress and cooperative with exam. Alert and oriented x2 to place and person.  Head: Normocephalic and atraumatic Ear: TM normal bilaterally Mouth: no erythema or exudates, MMM Eyes: PERRL, EOMI, conjunctivae normal, No scleral icterus.  Neck: Supple, Trachea midline normal ROM, No JVD, mass, thyromegaly, or carotid bruit present.  Cardiovascular: RRR, S1 normal, S2 normal, no MRG, pulses symmetric and intact bilaterally Pulmonary/Chest: CTAB, no wheezes, rales, or rhonchi Abdominal: Soft. Non-tender, non-distended, bowel sounds are normal, no masses, organomegaly, or guarding present.  GU: no CVA tenderness Musculoskeletal: No joint deformities, erythema, or stiffness, ROM full and no nontender Hematology: no cervical, inginal, or axillary adenopathy.  Neurological: A&O x2 Strength is normal and symmetric bilaterally, cranial nerve II-XII are grossly intact, no focal motor deficit, sensory intact to light touch bilaterally.  Skin: Warm, dry and intact. No rash, cyanosis, or clubbing.  Psychiatric: Normal mood and affect.     Labs on Admission:  Basic Metabolic Panel: No results found for this basename: NA:5,K:5,CL:5,CO2:5,GLUCOSE:5,BUN:5,CREATININE:5,CALCIUM:5,MG:5,PHOS:5 in the last 168 hours Liver Function Tests: No results found for this basename: AST:5,ALT:5,ALKPHOS:5,BILITOT:5,PROT:5,ALBUMIN:5 in the last 168 hours No results found for this basename: LIPASE:5,AMYLASE:5 in the last 168 hours No results found for this basename: AMMONIA:5 in the last 168 hours CBC: No results found for this basename: WBC:5,NEUTROABS:5,HGB:5,HCT:5,MCV:5,PLT:5 in the last 168 hours Cardiac Enzymes: No results found for this basename: CKTOTAL:5,CKMB:5,CKMBINDEX:5,TROPONINI:5 in the last 168 hours BNP: No components found with this basename: POCBNP:5 CBG:  Lab 12/08/11 1731  GLUCAP 112*     Radiological Exams on Admission: No results found.    Assessment/Plan Active Problems:  Dementia  UTI (lower urinary tract infection)  Altered mental status  Fever   1. Altered mental status: probably related to UTI. Will get urine cultures and start pt on levaquin. No focal deficits and no headache or facial asymmetry, if there are any acute mental status changes will get a CT head without contrast.  2.  bronchitis: started him on levaquin and will obtain cxr to evaluate for Pneumonia.  3. Fever; possibly secondary to uti. Blood cultures ordered 4. CAD;  No chest pain or sob. Continue with aspirin 5. Dm: will hold metformin and continue with SSI 6. Hypertension; will hold diuretics and continue the rest of medications 7. Urinary Incontinence; probably from UTI.  8. ? Dysphagia; will get a speech and swallow evaluation.  9. dvt prophylaxis 10. Pt/ot evalKathlen Mody [facility] Pager [phone]  If  7PM-7AM, please contact night-coverage [email or web address] Password [facility] 12/08/2011, 6:33 PM

## 2011-12-08 NOTE — Patient Instructions (Addendum)
Please go directly to [facility] Admitting.

## 2011-12-08 NOTE — Telephone Encounter (Signed)
Patient's son was walk-in request regarding patient: "walking slower, stuffing mouth w/food w/o chewing, incontinence [has wet his clothes twice], not acting normal". Per Dr. Beverely Low, patient sent to [facility] to R/O UTI, scheduled at 2:45pm/SLS

## 2011-12-08 NOTE — Progress Notes (Addendum)
Ricahrd Schwager, is a 78 y.o. male,   MRN: [id number]  -  DOB - 03-Feb-1925  Outpatient Primary MD for the patient is Neena Rhymes, MD  78 year old man presented to the outpatient clinic today with his son with complaint generalized weakness, confusion and urinary incontinence. At baseline patient has dementia but he is more confused than usual. His son had difficulty getting him out of bed today. Patient seemed to have difficulty swallowing. No focal motor deficits were noted by PCP.  In the office found to be febrile with a temperature of 101.3. Blood pressure 126/56. Pulse 60. Respirations 24. Oxygen saturation 94%. Patient was described as being nontoxic, relaxed in wheelchair. Was noted have rhonchi on lung auscultation. Urinalysis reported as being grossly abnormal.  Referred for admission for presumed UTI, possible pneumonia.  Active Problems:  Dementia  UTI (lower urinary tract infection)  Altered mental status  By history and conversation with outpatient provider patient appears to be stable for direct transfer to the medical floor. On arrival he will need full history and physical as well as orders.  Brendia Sacks, MD [facility] Disposition Team [phone]

## 2011-12-09 DIAGNOSIS — R509 Fever, unspecified: Secondary | ICD-10-CM

## 2011-12-09 DIAGNOSIS — I10 Essential (primary) hypertension: Secondary | ICD-10-CM

## 2011-12-09 LAB — GLUCOSE, CAPILLARY
Glucose-Capillary: 102 mg/dL — ABNORMAL HIGH (ref 70–99)
Glucose-Capillary: 119 mg/dL — ABNORMAL HIGH (ref 70–99)
Glucose-Capillary: 163 mg/dL — ABNORMAL HIGH (ref 70–99)

## 2011-12-09 LAB — CBC
HCT: 29.6 % — ABNORMAL LOW (ref 39.0–52.0)
Hemoglobin: 10.1 g/dL — ABNORMAL LOW (ref 13.0–17.0)
MCHC: 34.1 g/dL (ref 30.0–36.0)
RDW: 15.4 % (ref 11.5–15.5)
WBC: 18.1 10*3/uL — ABNORMAL HIGH (ref 4.0–10.5)

## 2011-12-09 LAB — PROCALCITONIN: Procalcitonin: 2 ng/mL

## 2011-12-09 MED ORDER — SODIUM CHLORIDE 0.9 % IV SOLN: INTRAVENOUS | Status: DC

## 2011-12-09 NOTE — Progress Notes (Signed)
[facility] PROGRESS NOTE  Lucas Obrien GNF:[id number] DOB: [birth year] DOA: 12/08/2011 PCP: Lucas Rhymes, MD  Assessment/Plan: Active Problems:  Dementia  UTI (lower urinary tract infection)  Altered mental status  Fever  1. Altered mental status: probably related to UTI. Will get urine cultures and start pt on levaquin. No focal deficits and no headache or facial asymmetry, if there are any acute mental status changes will get a CT head without contrast.  2. bronchitis: started him on levaquin and CXR does not show pneumonia.  3. Fever; possibly secondary to uti. Blood cultures ordered 4. CAD; No chest pain or sob. Continue with aspirin 5. Dm: will hold metformin and continue with SSI 6. Hypertension; will hold diuretics and continue the rest of medications 7. Urinary Incontinence; probably from UTI.  8. ? Dysphagia; will get a speech and swallow evaluation.  9. dvt prophylaxis 10. Pt/ot eval.  11. Leukocytosis; probably from infection. On antibiotics.   Procedures:  cxr  Antibiotics:  levaquin.  HPI/Subjective: Sleeping comfortably. As per the son, he had a rought night, he was slightly confused and was try ing to get out of bed. He had fever over night.   Objective: Filed Vitals:   12/09/11 0531 12/09/11 1001 12/09/11 1017 12/09/11 1403  BP: 161/57  129/61 119/43  Pulse: 73  58 54  Temp: 102.4 F (39.1 C) 97 F (36.1 C)  98.2 F (36.8 C)  Resp: 18   18  Height: 5\' 7"  (1.702 m)     Weight: 86.183 kg (190 lb)     SpO2: 97%   97%    Intake/Output Summary (Last 24 hours) at 12/09/11 1530 Last data filed at 12/09/11 0100  Gross per 24 hour  Intake    350 ml  Output    200 ml  Net    150 ml    Exam:   General:  Alert afebrile comfortable  Cardiovascular: s1s2 heard.  Respiratory: clear, no wheezing.   Abdomen: soft NT ND BS+  Extremities: no pedal edema.   Data Reviewed: Basic Metabolic Panel:  Lab 12/08/11 8469  NA 133*  K 4.1  CL  99  CO2 17*  GLUCOSE 114*  BUN 32*  CREATININE 1.46*  CALCIUM 10.0  MG 1.9  PHOS 2.4   Liver Function Tests:  Lab 12/08/11 2123  AST 22  ALT 12  ALKPHOS 54  BILITOT 0.5  PROT 8.4*  ALBUMIN 3.8   No results found for this basename: LIPASE:5,AMYLASE:5 in the last 168 hours No results found for this basename: AMMONIA:5 in the last 168 hours CBC:  Lab 12/09/11 1140 12/08/11 2123  WBC 18.1* 22.4*  NEUTROABS -- 17.9*  HGB 10.1* 11.5*  HCT 29.6* 34.2*  MCV 91.1 91.4  PLT 134* 162   Cardiac Enzymes: No results found for this basename: CKTOTAL:5,CKMB:5,CKMBINDEX:5,TROPONINI:5 in the last 168 hours BNP (last 3 results)  Basename 02/14/11 2211  PROBNP 269.0   CBG:  Lab 12/09/11 1114 12/09/11 0643 12/08/11 2203 12/08/11 1731  GLUCAP 119* 102* 121* 112*    No results found for this or any previous visit (from the past 240 hour(s)).   Studies: X-ray Chest Pa And Lateral   12/08/2011  *RADIOLOGY REPORT*  Clinical Data: Pneumonia  CHEST - 2 VIEW  Comparison: 02/14/2011  Findings: The heart is mildly enlarged.  Low volumes.  Vascular congestion.  No pneumothorax.  No pleural effusion.  IMPRESSION: Cardiomegaly and vascular congestion.  Original Report Authenticated By: Donavan Burnet, M.D.  Scheduled Meds:   . allopurinol  300 mg Oral Daily  . amLODipine  5 mg Oral Daily  . aspirin  325 mg Oral Daily  . budesonide-formoterol  2 puff Inhalation BID  . donepezil  5 mg Oral QHS  . enoxaparin (LOVENOX) injection  40 mg Subcutaneous Q24H  . insulin aspart  0-9 Units Subcutaneous TID WC  . ipratropium  1 spray Nasal TID  . levofloxacin (LEVAQUIN) IV  750 mg Intravenous Q48H  . memantine  10 mg Oral BID  . metoprolol  100 mg Oral BID  . pneumococcal 23 valent vaccine  0.5 mL Intramuscular Tomorrow-1000  . simvastatin  10 mg Oral QHS  . Tamsulosin HCl  0.4 mg Oral Daily  . thiamine  100 mg Oral Daily   Continuous Infusions:   . DISCONTD: sodium chloride        Lucas Obrien [facility] Pager [phone]  If 7PM-7AM, please contact night-coverage [email or web address] Password [email or web address] 12/09/2011, 3:30 PM   LOS: 1 day

## 2011-12-09 NOTE — Progress Notes (Signed)
Utilization review completed. Ferlin Fairhurst, RN, BSN. 

## 2011-12-09 NOTE — Progress Notes (Signed)
Temp 102.4. Incentive spirometer started. Altered mental status prevents him from using IS correctly. Does have congestion bilaterally and a productive cough. Sputum is thin and white. Dr. On call notified. Pt had blood culture drawn on admission.

## 2011-12-09 NOTE — Progress Notes (Signed)
Referral received for SNF. Chart reviewed and CSW has spoken with RNCM who indicates that patient is for DC to home with Home Health and DME.  PT recommends d/c home with Home Health.  Spoke with patient's son- Lucas Obrien- he had questions regarding Health Care Power of attorney.  Patient is on medication for dementia and has diagnosis of dementia.  I met with patient and did not feel that he fully understood concept of HCPOA. Thus, discussed this with son who is only child and next of kin.  Son states that he already has POA and he was encouraged to check that document to see if Healthcare POA was addressed in the document.  Son verbalized understanding and denied further question or concerns. He plans to take patient home with home health services at d/c.  CSW to sign off. Please re-consult if CSW needs arise.  [name]. [name]  [phone]

## 2011-12-09 NOTE — Evaluation (Signed)
Clinical/Bedside Swallow Evaluation Patient Details  Name: Lucas Obrien MRN: [id number] Date of Birth: 12-29-1924  Today's Date: 12/09/2011 Time: 3244-0102 SLP Time Calculation (min): 30 min  Past Medical History:  Past Medical History  Diagnosis Date  . Heart disease   . Hypertension   . Hyperlipidemia   . Urine incontinence   . Alcohol problem drinking   . Emphysema of lung   . Type II diabetes mellitus   . Chronic kidney disease     "moderate"  . Arthritis     "all over"  . Gout   . Dementia   . UTI (lower urinary tract infection) 12/08/2011    "first one"   Past Surgical History:  Past Surgical History  Procedure Date  . Coronary artery bypass graft 1994   HPI:  As per the son, patient had difficulty getting out of bed yesterday morning. At breakfast he failed to swallow and his son had to scoop out the food from his mouth. He had urinary incontinence this morning and  was found to be febrile this at doctors office and a urine sample was abnormal. As per the son, he has dementia and he seems to be more confused than usual.  He ambulates without a cane at home and he walked to the hospital from the car without any assistance. Son also reports chronic cough, mild, productive. No nausea or vomiting or diarrhea. No other complaints   Assessment / Plan / Recommendation Clinical Impression  Pt presents with symptoms of a mild dysphagia with potential for laryngeal penetration/aspiration - mental status is improving to baseline with adequate bolus attention and manipulation, but likely persisting pharyngeal/laryngeal involvement.  Will follow briefly to ensure PO toleration, and to educate son re: course of dementia and potential impact upon swallow function.      Aspiration Risk  Moderate    Diet Recommendation Regular;Thin liquid   Liquid Administration via: Cup Medication Administration: Whole meds with liquid Supervision: Full supervision/cueing for compensatory  strategies Compensations: Slow rate;Small sips/bites Postural Changes and/or Swallow Maneuvers: Seated upright 90 degrees    Other  Recommendations     Follow Up Recommendations  None    Frequency and Duration min 1 x/week  1 week   Pertinent Vitals/Pain No pain    SLP Swallow Goals Patient will utilize recommended strategies during swallow to increase swallowing safety with: Modified independent assistance   Jasmarie Coppock L. Samson Frederic, [place] CCC/SLP Pager [phone]   Blenda Mounts [name] 12/09/2011,9:55 AM

## 2011-12-09 NOTE — Evaluation (Signed)
Physical Therapy Evaluation Patient Details Name: Lucas Obrien MRN: [id number] DOB: April 13, 1925 Today's Date: 12/09/2011 Time: 4098-1191 PT Time Calculation (min): 32 min  PT Assessment / Plan / Recommendation Clinical Impression  Pt is 78 y/o male admitted for AMS due UTI.  Pt will benefit from acute PT services to improve overall mobility and prepare for safe d/c home with family.    PT Assessment  Patient needs continued PT services    Follow Up Recommendations  Home health PT;Supervision/Assistance - 24 hour    Barriers to Discharge        Equipment Recommendations  Rolling walker with 5" wheels    Recommendations for Other Services     Frequency Min 3X/week    Precautions / Restrictions Precautions Precautions: Fall   Pertinent Vitals/Pain No c/o pain      Mobility  Bed Mobility Bed Mobility: Supine to Sit Supine to Sit: 5: Supervision Details for Bed Mobility Assistance: Supervision for safety with heavy use of rail to assist to EOB Transfers Transfers: Sit to Stand;Stand to Sit Sit to Stand: 4: Min assist;From bed;With armrests Stand to Sit: 4: Min assist;To chair/3-in-1 Details for Transfer Assistance: (A) to initiate transfer with cues for hand placement.  Pt needed several attempts to stand from bed. Ambulation/Gait Ambulation/Gait Assistance: 4: Min assist Ambulation Distance (Feet): 100 Feet Assistive device: Rolling walker Ambulation/Gait Assistance Details: (A) to maintain balance and manage RW.  Pt tends to keep RW too far in front.  Encouraged pt to increase step length Gait Pattern: Step-through pattern;Decreased stride length;Shuffle;Trunk flexed;Narrow base of support    Exercises     PT Diagnosis: Difficulty walking;Abnormality of gait;Generalized weakness  PT Problem List: Decreased activity tolerance;Decreased balance;Decreased coordination;Decreased mobility;Decreased knowledge of use of DME PT Treatment Interventions:     PT Goals Acute  Rehab PT Goals PT Goal Formulation: With patient/family Time For Goal Achievement: 12/16/11 Potential to Achieve Goals: Good Pt will go Supine/Side to Sit: with modified independence PT Goal: Supine/Side to Sit - Progress: Goal set today Pt will go Sit to Supine/Side: with modified independence PT Goal: Sit to Supine/Side - Progress: Goal set today Pt will go Sit to Stand: with modified independence PT Goal: Sit to Stand - Progress: Goal set today Pt will go Stand to Sit: with modified independence PT Goal: Stand to Sit - Progress: Goal set today Pt will Ambulate: >150 feet;with supervision;with rolling walker PT Goal: Ambulate - Progress: Goal set today Pt will Go Up / Down Stairs: 3-5 stairs;with least restrictive assistive device;with min assist PT Goal: Up/Down Stairs - Progress: Goal set today  Visit Information  Last PT Received On: 12/09/11 Assistance Needed: +1 PT/OT Co-Evaluation/Treatment: Yes    Subjective Data  Subjective: "I'm doing ok."  "Not sure why I'm here." Patient Stated Goal: Family to go home and improve overall moblity   Prior Functioning  Home Living Lives With: Son Available Help at Discharge: Family Type of Home: House Home Access: Stairs to enter [employer] of Steps: 4 steps up hill and 4 into house; 2 steps to get into the back Entrance Stairs-Rails: Right (one set of stairs in front has 2 rails) Home Layout: One level Bathroom Shower/Tub: Child psychotherapist: Standard Bathroom Accessibility: Yes How Accessible: Accessible via walker Home Adaptive Equipment: Shower chair with back;Straight cane Prior Function Level of Independence: Independent Able to Take Stairs?: Reciprically Driving: No Vocation: Retired Musician: No difficulties Dominant Hand: Right    Cognition  Overall Cognitive Status: History  of cognitive impairments - at baseline Arousal/Alertness: Awake/alert Orientation Level:  Disoriented to;Time;Situation Behavior During Session: [facility] for tasks performed Cognition - Other Comments: Pt with hx of dementia and slow to process at times.    Extremity/Trunk Assessment Right Lower Extremity Assessment RLE ROM/Strength/Tone: Within functional levels Left Lower Extremity Assessment LLE ROM/Strength/Tone: Within functional levels   Balance Balance Balance Assessed: Yes Static Sitting Balance Static Sitting - Balance Support: Feet supported Static Sitting - Level of Assistance: 5: Stand by assistance  End of Session PT - End of Session Equipment Utilized During Treatment: Gait belt Activity Tolerance: Patient tolerated treatment well Patient left: in chair;with call bell/phone within reach;with family/visitor present Nurse Communication: Mobility status  GP     Caela Huot 12/09/2011, 11:09 AM Jake Shark, PT DPT [phone]

## 2011-12-09 NOTE — Evaluation (Signed)
Occupational Therapy Evaluation Patient Details Name: Lucas Obrien MRN: [id number] DOB: 25-Jun-1924 Today's Date: 12/09/2011 Time: 4098-1191 OT Time Calculation (min): 32 min  OT Assessment / Plan / Recommendation Clinical Impression  Pt admitted with AMS, UTI and unsteadiness. Pt will benefit from skilled OT in the acute setting to maximize I with ADL and ADL mobility prior to d/c home.     OT Assessment  Patient needs continued OT Services    Follow Up Recommendations  Home health OT;Supervision/Assistance - 24 hour    Barriers to Discharge      Equipment Recommendations  Rolling walker with 5" wheels;None recommended by OT    Recommendations for Other Services    Frequency  Min 2X/week    Precautions / Restrictions Precautions Precautions: Fall   Pertinent Vitals/Pain Pt denies any pain during eval    ADL  Grooming: Simulated;Min guard Where Assessed - Grooming: Supported standing (leans against sink) Upper Body Dressing: Performed;Minimal assistance (with gown) Where Assessed - Upper Body Dressing: Unsupported sitting Lower Body Dressing: Simulated;Moderate assistance Where Assessed - Lower Body Dressing: Supported sit to Pharmacist, hospital: Performed;Minimal Dentist Method: Sit to Barista: Regular height toilet Toileting - Clothing Manipulation and Hygiene: Performed;Minimal assistance Where Assessed - Engineer, mining and Hygiene: Standing Equipment Used: Rolling walker Transfers/Ambulation Related to ADLs: Min A with RW ambulation throughout room; pt initially attempted with no AD, but was unsteady with near LOB x 2 ADL Comments: Son in room and states pt looks better today than yesterday. Also, son states he and his sister will be able to provide necessary level of assist at home    OT Diagnosis: Generalized weakness;Cognitive deficits  OT Problem List: Decreased strength;Decreased activity  tolerance;Impaired balance (sitting and/or standing);Decreased knowledge of use of DME or AE;Decreased safety awareness OT Treatment Interventions: Self-care/ADL training;Therapeutic activities;Patient/family education;Balance training;DME and/or AE instruction   OT Goals Acute Rehab OT Goals OT Goal Formulation: With patient/family Time For Goal Achievement: 12/16/11 Potential to Achieve Goals: Good ADL Goals Pt Will Perform Grooming: Independently;Standing at sink ADL Goal: Grooming - Progress: Goal set today Pt Will Perform Lower Body Bathing: with supervision;Sit to stand in shower;Sit to stand from chair ADL Goal: Lower Body Bathing - Progress: Goal set today Pt Will Perform Lower Body Dressing: with set-up;with supervision;Sit to stand from chair;Sit to stand from bed ADL Goal: Lower Body Dressing - Progress: Goal set today Pt Will Transfer to Toilet: with supervision;Ambulation;with DME ADL Goal: Toilet Transfer - Progress: Goal set today Pt Will Perform Toileting - Clothing Manipulation: Independently;Standing ADL Goal: Toileting - Clothing Manipulation - Progress: Goal set today Pt Will Perform Tub/Shower Transfer: Tub transfer;with supervision;Ambulation;with DME;Shower seat with back ADL Goal: Tub/Shower Transfer - Progress: Goal set today  Visit Information  Last OT Received On: 12/09/11 Assistance Needed: +1 PT/OT Co-Evaluation/Treatment: Yes    Subjective Data  Subjective: Yes Ma'am- to just about everything said or asked by therapis Patient Stated Goal: Return home with son   Prior Functioning  Home Living Lives With: Son Available Help at Discharge: Family Type of Home: House Home Access: Stairs to enter [employer] of Steps: 4 steps up hill and 4 into house; 2 steps to get into the back Entrance Stairs-Rails: Right (one set of stairs in front has 2 rails) Home Layout: One level Bathroom Shower/Tub: Child psychotherapist:  Standard Bathroom Accessibility: Yes How Accessible: Accessible via walker Home Adaptive Equipment: Shower chair with back;Straight cane Prior Function Level of  Independence: Independent Able to Take Stairs?: Reciprically Driving: No Vocation: Retired Musician: No difficulties Dominant Hand: Right    Cognition  Overall Cognitive Status: History of cognitive impairments - at baseline Arousal/Alertness: Awake/alert Orientation Level: Disoriented to;Time;Situation Behavior During Session: WFL for tasks performed Cognition - Other Comments: Pt with hx of dementia and slow to process at times.    Extremity/Trunk Assessment Right Upper Extremity Assessment RUE ROM/Strength/Tone: WFL for tasks assessed RUE Sensation: WFL - Light Touch Left Upper Extremity Assessment LUE ROM/Strength/Tone: WFL for tasks assessed LUE Sensation: WFL - Light Touch Right Lower Extremity Assessment RLE ROM/Strength/Tone: Within functional levels Left Lower Extremity Assessment LLE ROM/Strength/Tone: Within functional levels   Mobility Bed Mobility Bed Mobility: Supine to Sit Supine to Sit: 5: Supervision Details for Bed Mobility Assistance: Supervision for safety with heavy use of rail to assist to EOB Transfers Sit to Stand: 4: Min assist;From bed;With armrests Stand to Sit: 4: Min assist;To chair/3-in-1 Details for Transfer Assistance: (A) to initiate transfer with cues for hand placement.  Pt needed several attempts to stand from bed.   Exercise    Balance Balance Balance Assessed: Yes Static Sitting Balance Static Sitting - Balance Support: Feet supported Static Sitting - Level of Assistance: 5: Stand by assistance  End of Session OT - End of Session Equipment Utilized During Treatment: Gait belt Activity Tolerance: Patient tolerated treatment well Patient left: in chair;with call bell/phone within reach;with family/visitor present Nurse Communication: Mobility status  GO      Quadir Muns 12/09/2011, 1:08 PM

## 2011-12-10 DIAGNOSIS — M109 Gout, unspecified: Secondary | ICD-10-CM

## 2011-12-10 LAB — GLUCOSE, CAPILLARY
Glucose-Capillary: 129 mg/dL — ABNORMAL HIGH (ref 70–99)
Glucose-Capillary: 213 mg/dL — ABNORMAL HIGH (ref 70–99)
Glucose-Capillary: 102 mg/dL — ABNORMAL HIGH (ref 70–99)

## 2011-12-10 LAB — CBC
Hemoglobin: 10.8 g/dL — ABNORMAL LOW (ref 13.0–17.0)
MCH: 30.5 pg (ref 26.0–34.0)
MCHC: 34 g/dL (ref 30.0–36.0)
Platelets: 147 10*3/uL — ABNORMAL LOW (ref 150–400)

## 2011-12-10 LAB — URINE CULTURE: Colony Count: 80000

## 2011-12-10 LAB — BASIC METABOLIC PANEL
Calcium: 9.8 mg/dL (ref 8.4–10.5)
GFR calc Af Amer: 54 mL/min — ABNORMAL LOW (ref >90)
GFR calc non Af Amer: 47 mL/min — ABNORMAL LOW (ref >90)
Glucose, Bld: 128 mg/dL — ABNORMAL HIGH (ref 70–99)
Potassium: 3.3 mEq/L — ABNORMAL LOW (ref 3.5–5.1)
Sodium: 137 mEq/L (ref 135–145)

## 2011-12-10 MED ORDER — DIPHENHYDRAMINE HCL 25 MG PO CAPS
25.0000 mg | ORAL_CAPSULE | Freq: Once | ORAL | Status: AC
  Administered 2011-12-10: 25 mg via ORAL
  Filled 2011-12-10: qty 1

## 2011-12-10 MED ORDER — POTASSIUM CHLORIDE CRYS ER 20 MEQ PO TBCR
40.0000 meq | EXTENDED_RELEASE_TABLET | Freq: Two times a day (BID) | ORAL | Status: AC
  Administered 2011-12-10 – 2011-12-11 (×2): 40 meq via ORAL
  Filled 2011-12-10 (×2): qty 1
  Filled 2011-12-10: qty 2

## 2011-12-10 NOTE — Progress Notes (Signed)
Physical Therapy Treatment Patient Details Name: Lucas Obrien MRN: [id number] DOB: [birth year] Today's Date: 12/10/2011 Time: 2130-8657 PT Time Calculation (min): 31 min  PT Assessment / Plan / Recommendation Comments on Treatment Session  Pt able to increase ambulation distance but needs cues for increase step length.  Pt able to stairs but needed two rails and has no rails at home.  Son present and able to assist pt into house.  Will continue to follow and continue to practice stair negotiation.    Follow Up Recommendations  Home health PT;Supervision/Assistance - 24 hour    Barriers to Discharge        Equipment Recommendations  Rolling walker with 5" wheels;None recommended by OT    Recommendations for Other Services    Frequency Min 3X/week   Plan      Precautions / Restrictions Precautions Precautions: Fall   Pertinent Vitals/Pain No c/o pain    Mobility  Bed Mobility Bed Mobility: Supine to Sit Supine to Sit: 4: Min assist Details for Bed Mobility Assistance: (A) with trunk OOB with cues for technique Transfers Transfers: Sit to Stand;Stand to Sit Sit to Stand: 4: Min guard;From bed;From toilet Stand to Sit: 4: Min guard;To chair/3-in-1 Details for Transfer Assistance: Minguard for safety with cues for hand placement Ambulation/Gait Ambulation/Gait Assistance: 4: Min guard Ambulation Distance (Feet): 200 Feet Assistive device: Rolling walker Ambulation/Gait Assistance Details: Minguard for safety with cues for RW placement and body position within RW.  Cues to increase step length and gait speed. Gait velocity: 1.09 (ft/sec) Stairs: Yes Stair Management Technique: Two rails;Step to pattern;Forwards Number of Stairs: 3     Exercises     PT Diagnosis:    PT Problem List:   PT Treatment Interventions:     PT Goals Acute Rehab PT Goals PT Goal Formulation: With patient/family Time For Goal Achievement: 12/16/11 Potential to Achieve Goals: Good Pt will go  Supine/Side to Sit: with modified independence PT Goal: Supine/Side to Sit - Progress: Progressing toward goal Pt will go Sit to Stand: with modified independence PT Goal: Sit to Stand - Progress: Progressing toward goal Pt will go Stand to Sit: with modified independence PT Goal: Stand to Sit - Progress: Progressing toward goal Pt will Ambulate: >150 feet;with supervision;with rolling walker PT Goal: Ambulate - Progress: Progressing toward goal Pt will Go Up / Down Stairs: 3-5 stairs;with least restrictive assistive device;with min assist PT Goal: Up/Down Stairs - Progress: Progressing toward goal  Visit Information  Last PT Received On: 12/10/11 Assistance Needed: +1    Subjective Data  Subjective: "I'm ready to go home.   Cognition  Overall Cognitive Status: History of cognitive impairments - at baseline Arousal/Alertness: Awake/alert Orientation Level: Disoriented to;Time;Situation Behavior During Session: WFL for tasks performed Cognition - Other Comments: Pt with hx of dementia and slow to process at times.    Balance     End of Session PT - End of Session Equipment Utilized During Treatment: Gait belt Activity Tolerance: Patient tolerated treatment well Patient left: in chair;with call bell/phone within reach;with family/visitor present Nurse Communication: Mobility status   GP     Zakyla Tonche 12/10/2011, 1:34 PM Lucas Obrien, PT DPT [phone]

## 2011-12-10 NOTE — Progress Notes (Signed)
[facility] PROGRESS NOTE  Lucas Obrien NFA:[id number] DOB: August 09, 1924 DOA: 12/08/2011 PCP: Lucas Rhymes, MD  Assessment/Plan: Active Problems:  Dementia  UTI (lower urinary tract infection)  Altered mental status  Fever  1. Altered mental status: probably related to UTI.  urine cultures from outside lab grew 10,000 pseudomonas and the cultures drawn at the hospital grew 80,000 colonies of multiple bacterial m orphotypes.  and  pt on levaquin. No focal deficits and no headache or facial asymmetry, if there are any acute mental status changes will get a CT head without contrast. Leukocytosis is improving, no fever over the last 24 hours.  2. bronchitis: started him on levaquin and CXR does not show pneumonia.  3. Fever; possibly secondary to uti. Blood cultures ordered and negative so far.  4. CAD; No chest pain or sob. Continue with aspirin 5. Dm: will hold metformin and continue with SSI 6. Hypertension; will hold diuretics and continue the rest of medications 7. Urinary Incontinence; probably from UTI.  8. ? Dysphagia; SLP cleared him for regular diet with thin liq. 9. dvt prophylaxis 10. Pt/ot eval. : home health PT/OT 11. Leukocytosis; probably from infection. On antibiotics. And improving.   Procedures:  cxr  Antibiotics:  levaquin.  HPI/Subjective: Comfortable and active. Objective: Filed Vitals:   12/10/11 0614 12/10/11 0624 12/10/11 1044 12/10/11 1522  BP: 171/56 158/68 152/70 140/54  Pulse: 67   59  Temp: 99 F (37.2 C)   98.2 F (36.8 C)  Resp: 18   18  Height:      Weight:      SpO2: 97%   98%    Intake/Output Summary (Last 24 hours) at 12/10/11 1924 Last data filed at 12/10/11 1500  Gross per 24 hour  Intake    240 ml  Output   1200 ml  Net   -960 ml    Exam:   General:  Alert afebrile comfortable  Cardiovascular: s1s2 heard.  Respiratory: clear, no wheezing.   Abdomen: soft NT ND BS+  Extremities: no pedal edema.   Data  Reviewed: Basic Metabolic Panel:  Lab 12/10/11 4696 12/08/11 2123  NA 137 133*  K 3.3* 4.1  CL 102 99  CO2 21 17*  GLUCOSE 128* 114*  BUN 28* 32*  CREATININE 1.34 1.46*  CALCIUM 9.8 10.0  MG -- 1.9  PHOS -- 2.4   Liver Function Tests:  Lab 12/08/11 2123  AST 22  ALT 12  ALKPHOS 54  BILITOT 0.5  PROT 8.4*  ALBUMIN 3.8   No results found for this basename: LIPASE:5,AMYLASE:5 in the last 168 hours No results found for this basename: AMMONIA:5 in the last 168 hours CBC:  Lab 12/10/11 0615 12/09/11 1140 12/08/11 2123  WBC 15.2* 18.1* 22.4*  NEUTROABS -- -- 17.9*  HGB 10.8* 10.1* 11.5*  HCT 31.8* 29.6* 34.2*  MCV 89.8 91.1 91.4  PLT 147* 134* 162   Cardiac Enzymes: No results found for this basename: CKTOTAL:5,CKMB:5,CKMBINDEX:5,TROPONINI:5 in the last 168 hours BNP (last 3 results)  Basename 02/14/11 2211  PROBNP 269.0   CBG:  Lab 12/10/11 1622 12/10/11 1152 12/10/11 0652 12/09/11 2154 12/09/11 1614  GLUCAP 213* 138* 129* 163* 97    Recent Results (from the past 240 hour(s))  URINE CULTURE     Status: Normal (Preliminary result)   Collection Time   12/08/11  3:18 PM      Component Value Range Status Comment   Colony Count 10,000 COLONIES/ML   Preliminary    Preliminary  Report PSEUDOMONAS AERUGINOSA   Preliminary   CULTURE, BLOOD (SINGLE)     Status: Normal (Preliminary result)   Collection Time   12/08/11  9:20 PM      Component Value Range Status Comment   Specimen Description BLOOD LEFT HAND   Final    Special Requests BOTTLES DRAWN AEROBIC ONLY 6CC   Final    Culture  Setup Time 12/09/2011 09:17   Final    Culture     Final    Value:        BLOOD CULTURE RECEIVED NO GROWTH TO DATE CULTURE WILL BE HELD FOR 5 DAYS BEFORE ISSUING A FINAL NEGATIVE REPORT   Report Status PENDING   Incomplete   URINE CULTURE     Status: Normal   Collection Time   12/08/11 10:26 PM      Component Value Range Status Comment   Specimen Description URINE, RANDOM   Final     Special Requests NONE   Final    Culture  Setup Time 12/09/2011 15:07   Final    Colony Count 80,000 COLONIES/ML   Final    Culture     Final    Value: Multiple bacterial morphotypes present, none predominant. Suggest appropriate recollection if clinically indicated.   Report Status 12/10/2011 FINAL   Final      Studies: X-ray Chest Pa And Lateral   12/08/2011  *RADIOLOGY REPORT*  Clinical Data: Pneumonia  CHEST - 2 VIEW  Comparison: 02/14/2011  Findings: The heart is mildly enlarged.  Low volumes.  Vascular congestion.  No pneumothorax.  No pleural effusion.  IMPRESSION: Cardiomegaly and vascular congestion.  Original Report Authenticated By: Lucas Obrien, M.D.    Scheduled Meds:    . allopurinol  300 mg Oral Daily  . amLODipine  5 mg Oral Daily  . aspirin  325 mg Oral Daily  . budesonide-formoterol  2 puff Inhalation BID  . donepezil  5 mg Oral QHS  . enoxaparin (LOVENOX) injection  40 mg Subcutaneous Q24H  . insulin aspart  0-9 Units Subcutaneous TID WC  . ipratropium  1 spray Nasal TID  . levofloxacin (LEVAQUIN) IV  750 mg Intravenous Q48H  . memantine  10 mg Oral BID  . metoprolol  100 mg Oral BID  . potassium chloride  40 mEq Oral BID  . simvastatin  10 mg Oral QHS  . Tamsulosin HCl  0.4 mg Oral Daily  . thiamine  100 mg Oral Daily   Continuous Infusions:    Lucas Obrien [facility] Pager [phone]  If 7PM-7AM, please contact night-coverage [email or web address] Password [email or web address] 12/10/2011, 7:24 PM   LOS: 2 days

## 2011-12-10 NOTE — Progress Notes (Signed)
Speech Language Pathology Dysphagia Treatment Patient Details Name: Lucas Obrien MRN: [id number] DOB: Jul 04, 1924 Today's Date: 12/10/2011 Time: 3086-5784 SLP Time Calculation (min): 15 min  Assessment / Plan / Recommendation Clinical Impression  Pt with improvements in swallow function today due to increased alertness, attentiveness.  No evidence of airway compromise associated with PO intake; consumed crackers/peanut butter with simultanous water - no s/s dysphagia.  Recommend continue current diet.  Spoke with family re: fluctuating nature of swallow associated with dementia.  No further SLP f/u warranted.      Diet Recommendation  Continue with Current Diet: Regular;Thin liquid    SLP Plan All goals met   Pertinent Vitals/Pain No pain   Swallowing Goals  SLP Swallowing Goals Patient will utilize recommended strategies during swallow to increase swallowing safety with: Modified independent assistance Swallow Study Goal #2 - Progress: Met  General Temperature Spikes Noted: No Respiratory Status: Room air Behavior/Cognition: Alert;Cooperative;Pleasant mood;Confused Oral Cavity - Dentition: Adequate natural dentition Patient Positioning: Upright in chair  Oral Cavity - Oral Hygiene Does patient have any of the following "at risk" factors?: None of the above   Dysphagia Treatment Treatment focused on: Skilled observation of diet tolerance;Patient/family/caregiver Dealer Educated: son and daughter Treatment Methods/Modalities: Skilled observation Patient observed directly with PO's: Yes Type of PO's observed: Regular;Thin liquids Feeding: Able to feed self Liquids provided via: Cup Amount of cueing:  (none)  Lucas Obrien L. Lucas Obrien, [place] CCC/SLP Pager [phone]      Lucas Obrien Lucas Obrien 12/10/2011, 2:20 PM

## 2011-12-11 LAB — GLUCOSE, CAPILLARY
Glucose-Capillary: 140 mg/dL — ABNORMAL HIGH (ref 70–99)
Glucose-Capillary: 175 mg/dL — ABNORMAL HIGH (ref 70–99)
Glucose-Capillary: 164 mg/dL — ABNORMAL HIGH (ref 70–99)

## 2011-12-11 LAB — BASIC METABOLIC PANEL
CO2: 24 mEq/L (ref 19–32)
Calcium: 10.2 mg/dL (ref 8.4–10.5)
Creatinine, Ser: 1.14 mg/dL (ref 0.50–1.35)
GFR calc Af Amer: 66 mL/min — ABNORMAL LOW (ref >90)
GFR calc non Af Amer: 57 mL/min — ABNORMAL LOW (ref >90)

## 2011-12-11 LAB — URINE CULTURE: Colony Count: 10000

## 2011-12-11 LAB — CBC
MCV: 90.7 fL (ref 78.0–100.0)
Platelets: 162 10*3/uL (ref 150–400)
RDW: 15.3 % (ref 11.5–15.5)
WBC: 11.8 10*3/uL — ABNORMAL HIGH (ref 4.0–10.5)

## 2011-12-11 MED ORDER — METFORMIN HCL 1000 MG PO TABS: 500.0000 mg | ORAL_TABLET | Freq: Two times a day (BID) | ORAL | Status: DC

## 2011-12-11 MED ORDER — LEVOFLOXACIN 750 MG PO TABS
750.0000 mg | ORAL_TABLET | ORAL | Status: DC
  Filled 2011-12-11: qty 1

## 2011-12-11 MED ORDER — LEVOFLOXACIN 750 MG PO TABS: 750.0000 mg | ORAL_TABLET | ORAL | Status: AC

## 2011-12-11 MED ORDER — LEVOFLOXACIN 750 MG PO TABS: 750.0000 mg | ORAL_TABLET | ORAL | Status: DC

## 2011-12-11 NOTE — Discharge Summary (Signed)
Physician Discharge Summary  Lucas Obrien ZHY:[id number] DOB: Jan 01, 1925 DOA: 12/08/2011  PCP: Neena Rhymes, MD  Admit date: 12/08/2011 Discharge date: 12/11/2011  Recommendations for Outpatient Follow-up:  1. Follow up with PCP in 2 to 4 weeks.   Discharge Diagnoses:  Active Problems:  Dementia  UTI (lower urinary tract infection)  Altered mental status secondary to UTI.  Fever   Discharge Condition: stable.  Diet recommendation: carb modified diet  History of present illness:  As per the son, patient had difficulty getting out of bed this morning. At breakfast he failed to swallow and his son had to scoop out the food from his mouth. He had urinary incontinence this morning and as per the son, this is unusual for him. He was found to be febrile this morning at doctors office and a urine sample was abnormal. As per the son, he has dementia and he seems to be more confused today when compared to yesterday. He ambulates without a cane at home and he walked to the hospital from the car without any assistance. Son also reports chronic cough, mild, productive. No nausea or vomiting or diarrhea. No other complaints.    Hospital Course:  1. Altered mental status: probably related to UTI. urine cultures from outside lab grew 10,000 pseudomonas and the cultures drawn at the hospital grew 80,000 colonies of multiple bacterial m orphotypes. and pt was started on levaquin. No focal deficits and no headache or facial asymmetry, if there were no acute mental status changes . Leukocytosis was improving, no fever over the last  48 hours.  2. bronchitis: started him on levaquin and CXR does not show pneumonia. He will be discharged on levaquin to complete the course. 3. Fever; possibly secondary to uti. Blood cultures ordered and negative so far.  4. CAD; No chest pain or sob. Continue with aspirin 5. Dm: metformin was held as creatinine was high. His hgba1c is 5.9 and we will discharge him on lower  dose of metformin.  6. Hypertension; resume blood pressure medications on discharge. 7. Urinary Incontinence; probably from UTI.  8. ? Dysphagia; SLP cleared him for regular diet with thin liq. 9. Pt/ot eval. : home health PT/OT 10. Leukocytosis; probably from infection. On antibiotics. And improving.   Procedures:  US KIDNEY.  Consultations:  NONE  Discharge Exam: Filed Vitals:   12/11/11 1300  BP: 161/68  Pulse: 92  Temp: 97.9 F (36.6 C)  Resp: 18   Filed Vitals:   12/11/11 0636 12/11/11 0925 12/11/11 1100 12/11/11 1300  BP: 170/62 167/63  161/68  Pulse: 63   92  Temp: 98.9 F (37.2 C)   97.9 F (36.6 C)  TempSrc:    Oral  Resp: 20   18  Height:   5\' 7"  (1.702 m)   Weight:   86.183 kg (190 lb)   SpO2: 94%   93%   General: Alert afebrile comfortable  Cardiovascular: s1s2 heard.  Respiratory: clear, no wheezing.  Abdomen: soft NT ND BS+  Extremities: no pedal edema.    Discharge Instructions  Discharge Orders    Future Appointments: Provider: Department: Dept Phone: Center:   01/06/2012 9:00 AM Sheliah Hatch, MD [facility] [phone] [facility]     Future Orders Please Complete By Expires   Diet Carb Modified      Discharge instructions      Comments:   Follow up with PCP in 2 weeks.   Activity as tolerated - No restrictions  Medication List  As of 12/11/2011  5:53 PM   TAKE these medications         accu-chek multiclix lancets   Use as directed      allopurinol 300 MG tablet   Commonly known as: ZYLOPRIM   Take 300 mg by mouth daily.      amLODipine 5 MG tablet   Commonly known as: NORVASC   Take 5 mg by mouth daily.      aspirin 325 MG tablet   Take 325 mg by mouth daily.      budesonide-formoterol 160-4.5 MCG/ACT inhaler   Commonly known as: SYMBICORT   Inhale 2 puffs into the lungs 2 (two) times daily.      donepezil 5 MG tablet   Commonly known as: ARICEPT   Take 1 tablet (5 mg total) by mouth at bedtime.       glucose blood test strip   1 each by Other route 2 (two) times daily. Use as instructed      glucose blood test strip   Use as instructed      ipratropium 0.06 % nasal spray   Commonly known as: ATROVENT   Place 1 spray into the nose 3 (three) times daily.      levofloxacin 750 MG tablet   Commonly known as: LEVAQUIN   Take 1 tablet (750 mg total) by mouth every other day.      meclizine 25 MG tablet   Commonly known as: ANTIVERT   Take 25 mg by mouth 3 (three) times daily as needed. For dizziness      memantine 10 MG tablet   Commonly known as: NAMENDA   Take 10 mg by mouth 2 (two) times daily.      metFORMIN 1000 MG tablet   Commonly known as: GLUCOPHAGE   Take 0.5 tablets (500 mg total) by mouth 2 (two) times daily with a meal.      metoprolol 100 MG tablet   Commonly known as: LOPRESSOR   Take 100 mg by mouth 2 (two) times daily.      simvastatin 10 MG tablet   Commonly known as: ZOCOR   Take 10 mg by mouth at bedtime.      Tamsulosin HCl 0.4 MG Caps   Commonly known as: FLOMAX   Take 0.4 mg by mouth daily.      thiamine 100 MG tablet   Commonly known as: VITAMIN B-1   Take 100 mg by mouth daily.      valsartan-hydrochlorothiazide 320-25 MG per tablet   Commonly known as: DIOVAN-HCT   Take 1 tablet by mouth daily.              The results of significant diagnostics from this hospitalization (including imaging, microbiology, ancillary and laboratory) are listed below for reference.    Significant Diagnostic Studies: X-ray Chest Pa And Lateral   12/08/2011  *RADIOLOGY REPORT*  Clinical Data: Pneumonia  CHEST - 2 VIEW  Comparison: 02/14/2011  Findings: The heart is mildly enlarged.  Low volumes.  Vascular congestion.  No pneumothorax.  No pleural effusion.  IMPRESSION: Cardiomegaly and vascular congestion.  Original Report Authenticated By: Donavan Burnet, M.D.    Microbiology: Recent Results (from the past 240 hour(s))  URINE CULTURE     Status: Normal     Collection Time   12/08/11  3:18 PM      Component Value Range Status Comment   Culture PSEUDOMONAS AERUGINOSA   Final  Colony Count 10,000 COLONIES/ML   Final    Organism ID, Bacteria PSEUDOMONAS AERUGINOSA   Final   CULTURE, BLOOD (SINGLE)     Status: Normal (Preliminary result)   Collection Time   12/08/11  9:20 PM      Component Value Range Status Comment   Specimen Description BLOOD LEFT HAND   Final    Special Requests BOTTLES DRAWN AEROBIC ONLY 6CC   Final    Culture  Setup Time 12/09/2011 09:17   Final    Culture     Final    Value:        BLOOD CULTURE RECEIVED NO GROWTH TO DATE CULTURE WILL BE HELD FOR 5 DAYS BEFORE ISSUING A FINAL NEGATIVE REPORT   Report Status PENDING   Incomplete   URINE CULTURE     Status: Normal   Collection Time   12/08/11 10:26 PM      Component Value Range Status Comment   Specimen Description URINE, RANDOM   Final    Special Requests NONE   Final    Culture  Setup Time 12/09/2011 15:07   Final    Colony Count 80,000 COLONIES/ML   Final    Culture     Final    Value: Multiple bacterial morphotypes present, none predominant. Suggest appropriate recollection if clinically indicated.   Report Status 12/10/2011 FINAL   Final      Labs: Basic Metabolic Panel:  Lab 12/10/11 2130 12/08/11 2123  NA 137 133*  K 3.3* 4.1  CL 102 99  CO2 21 17*  GLUCOSE 128* 114*  BUN 28* 32*  CREATININE 1.34 1.46*  CALCIUM 9.8 10.0  MG -- 1.9  PHOS -- 2.4   Liver Function Tests:  Lab 12/08/11 2123  AST 22  ALT 12  ALKPHOS 54  BILITOT 0.5  PROT 8.4*  ALBUMIN 3.8   No results found for this basename: LIPASE:5,AMYLASE:5 in the last 168 hours No results found for this basename: AMMONIA:5 in the last 168 hours CBC:  Lab 12/11/11 1645 12/10/11 0615 12/09/11 1140 12/08/11 2123  WBC 11.8* 15.2* 18.1* 22.4*  NEUTROABS -- -- -- 17.9*  HGB 11.6* 10.8* 10.1* 11.5*  HCT 34.2* 31.8* 29.6* 34.2*  MCV 90.7 89.8 91.1 91.4  PLT 162 147* 134* 162   Cardiac  Enzymes: No results found for this basename: CKTOTAL:5,CKMB:5,CKMBINDEX:5,TROPONINI:5 in the last 168 hours BNP: BNP (last 3 results)  Basename 02/14/11 2211  PROBNP 269.0   CBG:  Lab 12/11/11 1735 12/11/11 1128 12/11/11 0648 12/10/11 2256 12/10/11 2149  GLUCAP 169* 164* 175* 102* 140*    Time coordinating discharge: 45 MINUTES  Signed:  Bridney Guadarrama  [facility] 12/11/2011, 5:53 PM  ADDENDUM: pt had foley removed yesterday and he was not  able to void, he was encouraged to drink fluids and on the morning of 12/12/11, he was able to void without any difficulties. He is discharged home with Home PT .

## 2011-12-11 NOTE — Progress Notes (Signed)
PH[facility] - PHYSICIAN COMMUNICATION DR:    CONCERNING: Antibiotic IV to Oral Route Change Policy  RECOMMENDATION: This patient is receiving Levaquin by the intravenous route.  Based on criteria approved by the Pharmacy and Therapeutics Committee, the antibiotic(s) is/are being converted to the equivalent oral dose form(s).   DESCRIPTION: These criteria include:  Patient being treated for a respiratory tract infection, urinary tract infection, or cellulitis  The patient is not neutropenic and does not exhibit a GI malabsorption state  The patient is eating (either orally or via tube) and/or has been taking other orally administered medications for a least 24 hours  The patient is improving clinically and has a Tmax < 100.5  If you have questions about this conversion, please contact the Pharmacy Department  []   [phone] )  [facility] [x]   [phone] )  [facility]  []   [phone] )  [facility] []   [phone] )  [facility] S. Merilynn Finland, PharmD, BCPS Clinical Staff Pharmacist Pager [phone]

## 2011-12-12 NOTE — Progress Notes (Signed)
Pt to discharge home this evening.  Foley cath d/c'd at 6 pm.  Pt had been encouraged to drink fluids since foley d/c.1st void 25 cc bloody urine at approximately 8 pm, 2nd void 50 cc amber/blood tinged urine at approximately 9 pm, bladder scan results 50 cc.  Pt has been unable to void since.    Notified MD on call, Dr. Claiborne Billings, of family/staff concerns of pt's ability to void once d/c'd home.  Order received to restart IVF & pt will d/c in am if sufficient urine output.  Will continue to monitor urine output closely throughout night.  Dorothyann Peng RN

## 2011-12-12 NOTE — Progress Notes (Signed)
   CARE MANAGEMENT NOTE 12/12/2011  Patient:  Lucas Obrien, Lucas Obrien   Account Number:  [id number]  Date Initiated:  12/12/2011  Documentation initiated by:  [facility]  Subjective/Objective Assessment:     Action/Plan:   lives at home with son, Lucas Obrien   Anticipated DC Date:  12/12/2011   Anticipated DC Plan:  HOME W HOME HEALTH SERVICES      DC Planning Services  CM consult      [facility] Choice  HOME HEALTH   Choice offered to / List presented to:  C-4 Adult Children   DME arranged  3-N-1  [facility] - [name]      DME agency  [facility]     HH arranged  HH-2 PT      [facility] agency  [facility]   Status of service:  Completed, signed off Medicare Important Message given?   (If response is "NO", the following Medicare IM given date fields will be blank) Date Medicare IM given:   Date Additional Medicare IM given:    Discharge Disposition:  HOME W HOME HEALTH SERVICES  Per UR Regulation:    If discussed at Long Length of Stay Meetings, dates discussed:    Comments:  12/12/2011 Offered choice to son, Lucas Obrien for [facility]. Contacted [facility] for [facility] PT for scheduled d/c today. Faxed orders, facesheet, and d/c summary to [place]. Spoke to [place], with [facility]. They can provide DME. Faxed orders, and facesheet to [facility] for DME to be delivered to home. Isidoro Donning RN CCM Case Mgmt phone [phone]

## 2011-12-13 ENCOUNTER — Telehealth: Payer: Self-pay | Admitting: Family Medicine

## 2011-12-13 NOTE — Telephone Encounter (Signed)
Pt should use a heating pad, alternate tylenol and ibuprofen every 4 hrs (2 OTC ibuprofen= 400mg ).  If no improvement, will need to be seen.  If worsening, will need to be seen.

## 2011-12-13 NOTE — Telephone Encounter (Signed)
Caller: Ricky/Child; PCP: Sheliah Hatch.; CB#: [phone]; ; ; Call regarding Neck Pain;  Chilton developed neck pain on 12/12/11.  States that he is "holding his neck stiff".  Pain is located on the left side.  Afebrile.  Was d/c'd from hospital on 12/12/11 d/t UTI.  Has not taken anything for pain but took Tylenol on 12/12/11 with no relief.  Utilized Neck Pain Guideline.  See PCP within 72 hrs disposition.  [name] wants to know if there is something he can take for the pain or if he really needs to be seen.  Please f/u pt.

## 2011-12-13 NOTE — Telephone Encounter (Signed)
Please advise 

## 2011-12-13 NOTE — Telephone Encounter (Signed)
Noted waiver in pt chart signed to allow detailed messages to be left on voicemail, left detailed message about: results/instructions/prescribtion information. Advise if any further concerns or questions please call our office at [phone]. Advised for pt to call office if any further needs

## 2011-12-13 NOTE — Progress Notes (Signed)
   CARE MANAGEMENT NOTE 12/13/2011  Patient:  Lucas Obrien, Lucas Obrien   Account Number:  [id number]  Date Initiated:  12/12/2011  Documentation initiated by:  [facility]  Subjective/Objective Assessment:     Action/Plan:   lives at home with son, [name]   Anticipated DC Date:  12/12/2011   Anticipated DC Plan:  HOME W HOME HEALTH SERVICES      DC Planning Services  CM consult      [facility] Choice  HOME HEALTH   Choice offered to / List presented to:  C-4 Adult Children   DME arranged  3-N-1  [facility] - [name]      DME agency  [facility]     HH arranged  HH-2 PT      [facility] agency  [facility]   Status of service:  Completed, signed off Medicare Important Message given?   (If response is "NO", the following Medicare IM given date fields will be blank) Date Medicare IM given:   Date Additional Medicare IM given:    Discharge Disposition:  HOME W HOME HEALTH SERVICES  Per UR Regulation:    If discussed at Long Length of Stay Meetings, dates discussed:    Comments:  12/12/2011 0930 Contacted son, [name] and received DME on yesterday evening. Provided contact info for [place]. Isidoro Donning RN CCM Case Mgmt phone [phone]  12/12/2011 Offered choice to son, [name] for [facility]. Contacted [facility] for [facility] PT for scheduled d/c today. Faxed orders, facesheet, and d/c summary to [place]. Spoke to [place], with [facility]. They can provide DME. Faxed orders, and facesheet to [facility] for DME to be delivered to home. Isidoro Donning RN CCM Case Mgmt phone [phone]

## 2011-12-15 ENCOUNTER — Telehealth: Payer: Self-pay | Admitting: Family Medicine

## 2011-12-15 LAB — CULTURE, BLOOD (SINGLE)

## 2011-12-15 NOTE — Telephone Encounter (Signed)
FYI

## 2011-12-15 NOTE — Telephone Encounter (Addendum)
Caller: Lucas Obrien/Child; PCP: Sheliah Hatch.; CB#: [phone]; ; ; Call regarding Question;  Lucas Obrien states office left message regarding Lucas Obrien using a heating pad and taking Tylenol/Ibuprofen for neck pain.  Calling to make sure he understood the message correctly.  Accessed EPIC and provided the following information: Neena Rhymes, MD  12/13/2011 12:09 PM  Signed Pt should use a heating pad, alternate tylenol and ibuprofen every 4 hrs (2 OTC ibuprofen= 400mg ).  If no improvement, will need to be seen.  If worsening, will need to be seen.  Lucas Obrien verbalized understanding.

## 2011-12-20 ENCOUNTER — Other Ambulatory Visit: Payer: Self-pay | Admitting: Family Medicine

## 2011-12-20 MED ORDER — VALSARTAN-HYDROCHLOROTHIAZIDE 320-25 MG PO TABS: 1.0000 | ORAL_TABLET | Freq: Every day | ORAL | Status: DC

## 2011-12-20 MED ORDER — TAMSULOSIN HCL 0.4 MG PO CAPS: 0.4000 mg | ORAL_CAPSULE | Freq: Every day | ORAL | Status: DC

## 2011-12-20 NOTE — Telephone Encounter (Signed)
rx sent to pharmacy by e-script  

## 2011-12-20 NOTE — Telephone Encounter (Signed)
Refills x 2 last ov 5.8.13-follow up  1-Valsartan-HCTZ  (Tab)  320-25 MG Take 1 tablet by mouth daily. # 30 wt/6-refills Last fill 6.21.13  2-Tamsulosin HCl (Cap)  0.4 MG Take 0.4 mg by mouth daily. #30 wt 6-refills Last fill 6.21.13

## 2011-12-22 ENCOUNTER — Telehealth: Payer: Self-pay | Admitting: *Deleted

## 2011-12-22 NOTE — Telephone Encounter (Signed)
Received incoming call to request PT order for pt for generalized strengthening, balance and endurance, verbal order given for pt per MD Beverely Low, best contact for Lucas Obrien [phone], rep understood and will implement PT

## 2011-12-24 ENCOUNTER — Encounter: Payer: Self-pay | Admitting: Family Medicine

## 2011-12-24 ENCOUNTER — Ambulatory Visit (INDEPENDENT_AMBULATORY_CARE_PROVIDER_SITE_OTHER): Payer: Medicare Other | Admitting: Family Medicine

## 2011-12-24 VITALS — BP 123/73 | HR 61 | Temp 97.4°F | Ht 64.0 in | Wt 189.0 lb

## 2011-12-24 DIAGNOSIS — E119 Type 2 diabetes mellitus without complications: Secondary | ICD-10-CM

## 2011-12-24 DIAGNOSIS — R7989 Other specified abnormal findings of blood chemistry: Secondary | ICD-10-CM | POA: Insufficient documentation

## 2011-12-24 DIAGNOSIS — R0982 Postnasal drip: Secondary | ICD-10-CM

## 2011-12-24 DIAGNOSIS — R799 Abnormal finding of blood chemistry, unspecified: Secondary | ICD-10-CM

## 2011-12-24 DIAGNOSIS — D72829 Elevated white blood cell count, unspecified: Secondary | ICD-10-CM | POA: Insufficient documentation

## 2011-12-24 DIAGNOSIS — R4182 Altered mental status, unspecified: Secondary | ICD-10-CM

## 2011-12-24 LAB — BASIC METABOLIC PANEL: GFR: 48.22 mL/min — ABNORMAL LOW (ref >60.00)

## 2011-12-24 LAB — CBC WITH DIFFERENTIAL/PLATELET
Basophils Relative: 0.7 % (ref 0.0–3.0)
Eosinophils Absolute: 0.4 10*3/uL (ref 0.0–0.7)
Hemoglobin: 11 g/dL — ABNORMAL LOW (ref 13.0–17.0)
MCHC: 32.6 g/dL (ref 30.0–36.0)
MCV: 93.8 fl (ref 78.0–100.0)
Monocytes Absolute: 0.5 10*3/uL (ref 0.1–1.0)
Neutro Abs: 3.4 10*3/uL (ref 1.4–7.7)
Neutrophils Relative %: 52.8 % (ref 43.0–77.0)
RBC: 3.59 Mil/uL — ABNORMAL LOW (ref 4.22–5.81)

## 2011-12-24 NOTE — Patient Instructions (Addendum)
Follow up in 3 months to recheck diabetes Continue to break the Metformin in half (500mg ) twice daily He can have 1-2 Pepsi's a week Continue to drink plenty of water We'll notify you of your lab results Add the Claritin daily and mucinex as needed to break up the congestion Call with any questions or concerns Have a great summer!!!

## 2011-12-24 NOTE — Progress Notes (Signed)
  Subjective:    Patient ID: Lucas Obrien, male    DOB: 1924-11-15, 78 y.o.   MRN: [id number]  HPI Hospital f/u: was admitted on 7/10 for UTI and altered mental status.  D/c'd on 7/13.  Son reports pt is back to baseline.  Doing well.  No urinary concerns.  BP is much better today than at time of d/c.  Metformin was decreased to 500mg  bid due to increased Cr and excellent A1C.  No CP, SOB, HAs, edema.  Pt started home health PT after hospital DC- now ambulating w/ cane.  URI- sxs started a couple days ago.  Minimal cough, nasal congestion.  + PND.  'i just know i got it'.  Son stopped claritin and mucinex.   Review of Systems For ROS see HPI     Objective:   Physical Exam  Constitutional: He appears well-developed and well-nourished. No distress.  HENT:  Head: Normocephalic and atraumatic.       No TTP over sinuses + turbinate edema + PND TMs normal bilaterally  Eyes: Conjunctivae and EOM are normal. Pupils are equal, round, and reactive to light.  Neck: Normal range of motion. Neck supple.  Cardiovascular: Normal rate, regular rhythm and normal heart sounds.   Pulmonary/Chest: Effort normal and breath sounds normal. No respiratory distress. He has no wheezes.  Musculoskeletal: He exhibits no edema.  Lymphadenopathy:    He has no cervical adenopathy.  Neurological: He is alert.  Skin: Skin is warm and dry.  Psychiatric: He has a normal mood and affect. His behavior is normal.          Assessment & Plan:

## 2011-12-27 ENCOUNTER — Telehealth: Payer: Self-pay | Admitting: Family Medicine

## 2011-12-27 MED ORDER — METOPROLOL TARTRATE 100 MG PO TABS: 100.0000 mg | ORAL_TABLET | Freq: Two times a day (BID) | ORAL | Status: DC

## 2011-12-27 NOTE — Telephone Encounter (Signed)
rx sent to pharmacy by e-script  

## 2011-12-27 NOTE — Telephone Encounter (Signed)
Refill: Metoprolol tartrate 100mg  tab. Take 1 tablet by mouth once daily for blood pressure. Qty 30. Last fill 11-27-11

## 2012-01-03 ENCOUNTER — Telehealth: Payer: Self-pay | Admitting: Family Medicine

## 2012-01-03 NOTE — Telephone Encounter (Signed)
Caller: Ricky/Child; PCP: Sheliah Hatch.; CB#: [phone];  Call regarding Blood Glucose= 223   After YQ[facility]@ 1430  -01/02/13. Taken Off Metformin on 12/29/11 and told to call if sugar goes up. Has been in the 130-140  Range daily. Just finished therapy @ 1530 and asked to recheck blood glucose - now sugar down to 146. Triage per Diabetes:Control Problems and advised to call back if needed.

## 2012-01-04 NOTE — Telephone Encounter (Signed)
Called pt to advise, pt son will continue to log as directed and will bring at upcoming OV to determine next steps, pt son will call if any further assistance needed.

## 2012-01-04 NOTE — Assessment & Plan Note (Signed)
Recurrent problem.  Pt w/out evidence of infxn on PE but does show sxs of untreated seasonal allergies.  Resume OTC antihistamine and mucinex.

## 2012-01-04 NOTE — Assessment & Plan Note (Signed)
New.  Discovered at time of hospitalization.  Likely due to UTI.  Will check today to ensure this is resolving.

## 2012-01-04 NOTE — Assessment & Plan Note (Signed)
Chronic problem.  Metformin recently decreased to 500mg  due to elevated Cr.  Check Cr to determine whether metformin can safely be continued or needs to be stopped in setting of well controlled DM.

## 2012-01-04 NOTE — Telephone Encounter (Signed)
Would take CBGs once daily (sometimes in AM, sometimes after eating) so we can determine whether he needs to restart meds in the future.

## 2012-01-04 NOTE — Assessment & Plan Note (Signed)
Resolved.  Likely due to UTI.  Pt's son reports he is back to baseline and this is how he appears in office today.

## 2012-01-04 NOTE — Assessment & Plan Note (Signed)
New.  At time of hospital admission it was felt to be due to pt's dehydration and UTI.  Check labs today to determine if levels have returned to baseline.

## 2012-01-04 NOTE — Telephone Encounter (Signed)
Please note and advise, pt son asked how long he needs to continue to take pt CBG's going forward per stopping metformin

## 2012-01-06 ENCOUNTER — Telehealth: Payer: Self-pay | Admitting: Family Medicine

## 2012-01-06 ENCOUNTER — Ambulatory Visit: Payer: Medicare Other | Admitting: Family Medicine

## 2012-01-06 ENCOUNTER — Other Ambulatory Visit (INDEPENDENT_AMBULATORY_CARE_PROVIDER_SITE_OTHER): Payer: Medicare Other

## 2012-01-06 DIAGNOSIS — R748 Abnormal levels of other serum enzymes: Secondary | ICD-10-CM

## 2012-01-06 LAB — BASIC METABOLIC PANEL
CO2: 23 mEq/L (ref 19–32)
Chloride: 106 mEq/L (ref 96–112)
Creatinine, Ser: 1.3 mg/dL (ref 0.4–1.5)
Glucose, Bld: 134 mg/dL — ABNORMAL HIGH (ref 70–99)

## 2012-01-06 NOTE — Telephone Encounter (Signed)
Patient's son wanted to make sure it would be ok to give his dad eye drops? Lucas Obrien

## 2012-01-06 NOTE — Progress Notes (Signed)
Labs only

## 2012-01-06 NOTE — Telephone Encounter (Signed)
Called pt son and advised ok to give pt eye drops, clarified Visine is ok to use OTC, pt son understood

## 2012-02-01 ENCOUNTER — Telehealth: Payer: Self-pay | Admitting: Family Medicine

## 2012-02-01 MED ORDER — BUDESONIDE-FORMOTEROL FUMARATE 160-4.5 MCG/ACT IN AERO: 2.0000 | INHALATION_SPRAY | Freq: Two times a day (BID) | RESPIRATORY_TRACT | Status: DC

## 2012-02-01 NOTE — Telephone Encounter (Signed)
rx sent to pharmacy by e-script  

## 2012-02-01 NOTE — Telephone Encounter (Signed)
Refill: Symbicort 160-4.5 mcg inhaler. Inhale 2 puffs by mouth twice a day. Qty 10.2. Last fill 12-26-11

## 2012-02-09 ENCOUNTER — Other Ambulatory Visit: Payer: Self-pay | Admitting: Family Medicine

## 2012-02-09 MED ORDER — METOPROLOL TARTRATE 100 MG PO TABS: 100.0000 mg | ORAL_TABLET | Freq: Two times a day (BID) | ORAL | Status: DC

## 2012-02-09 NOTE — Telephone Encounter (Signed)
rx sent to pharmacy by e-script  

## 2012-02-09 NOTE — Telephone Encounter (Signed)
Refill Metoprolol Tartrate (Tab) 100 MG Take 1 tablet (100 mg total) by mouth 2 (two) times daily #60 wt/3-refills  Note Pharmacy fax says last fill 9.8.13 Last ov 7.26.13 hospital f/u

## 2012-02-13 ENCOUNTER — Emergency Department (HOSPITAL_COMMUNITY)
Admission: EM | Admit: 2012-02-13 | Discharge: 2012-02-13 | Disposition: A | Discharge status: Home / Self Care | Payer: Medicare Other | Attending: Emergency Medicine | Admitting: Emergency Medicine

## 2012-02-13 ENCOUNTER — Emergency Department (HOSPITAL_COMMUNITY): Payer: Medicare Other

## 2012-02-13 ENCOUNTER — Encounter (HOSPITAL_COMMUNITY): Payer: Self-pay | Admitting: Physical Medicine and Rehabilitation

## 2012-02-13 DIAGNOSIS — I519 Heart disease, unspecified: Secondary | ICD-10-CM | POA: Insufficient documentation

## 2012-02-13 DIAGNOSIS — Z87891 Personal history of nicotine dependence: Secondary | ICD-10-CM | POA: Insufficient documentation

## 2012-02-13 DIAGNOSIS — Z79899 Other long term (current) drug therapy: Secondary | ICD-10-CM | POA: Insufficient documentation

## 2012-02-13 DIAGNOSIS — Z951 Presence of aortocoronary bypass graft: Secondary | ICD-10-CM | POA: Insufficient documentation

## 2012-02-13 DIAGNOSIS — I129 Hypertensive chronic kidney disease with stage 1 through stage 4 chronic kidney disease, or unspecified chronic kidney disease: Secondary | ICD-10-CM | POA: Insufficient documentation

## 2012-02-13 DIAGNOSIS — N183 Chronic kidney disease, stage 3 unspecified: Secondary | ICD-10-CM | POA: Insufficient documentation

## 2012-02-13 DIAGNOSIS — N289 Disorder of kidney and ureter, unspecified: Secondary | ICD-10-CM

## 2012-02-13 DIAGNOSIS — R42 Dizziness and giddiness: Secondary | ICD-10-CM | POA: Insufficient documentation

## 2012-02-13 DIAGNOSIS — E785 Hyperlipidemia, unspecified: Secondary | ICD-10-CM | POA: Insufficient documentation

## 2012-02-13 DIAGNOSIS — Z7982 Long term (current) use of aspirin: Secondary | ICD-10-CM | POA: Insufficient documentation

## 2012-02-13 LAB — CBC WITH DIFFERENTIAL/PLATELET
Basophils Relative: 0 % (ref 0–1)
Eosinophils Absolute: 0.4 10*3/uL (ref 0.0–0.7)
Eosinophils Relative: 6 % — ABNORMAL HIGH (ref 0–5)
Hemoglobin: 12.2 g/dL — ABNORMAL LOW (ref 13.0–17.0)
Lymphs Abs: 1.9 10*3/uL (ref 0.7–4.0)
MCH: 30.3 pg (ref 26.0–34.0)
MCHC: 32.8 g/dL (ref 30.0–36.0)
MCV: 92.3 fL (ref 78.0–100.0)
Monocytes Relative: 10 % (ref 3–12)
Platelets: 165 10*3/uL (ref 150–400)
RBC: 4.03 MIL/uL — ABNORMAL LOW (ref 4.22–5.81)

## 2012-02-13 LAB — URINALYSIS, ROUTINE W REFLEX MICROSCOPIC
Glucose, UA: NEGATIVE mg/dL
Leukocytes, UA: NEGATIVE
Nitrite: NEGATIVE
pH: 5.5 (ref 5.0–8.0)

## 2012-02-13 LAB — PROTIME-INR: INR: 1.06 (ref 0.00–1.49)

## 2012-02-13 LAB — COMPREHENSIVE METABOLIC PANEL
BUN: 30 mg/dL — ABNORMAL HIGH (ref 6–23)
Calcium: 10.5 mg/dL (ref 8.4–10.5)
GFR calc Af Amer: 46 mL/min — ABNORMAL LOW (ref >90)
Glucose, Bld: 136 mg/dL — ABNORMAL HIGH (ref 70–99)
Total Protein: 8.3 g/dL (ref 6.0–8.3)

## 2012-02-13 LAB — POCT I-STAT TROPONIN I: Troponin i, poc: 0.01 ng/mL (ref 0.00–0.08)

## 2012-02-13 LAB — URINE MICROSCOPIC-ADD ON

## 2012-02-13 NOTE — ED Notes (Signed)
Pt presents to department for evaluation of dizziness. States he was sitting down this morning when he had episode of dizziness, lasted 1-2 minutes. Denies LOC. Upon arrival denies pain. He is alert and can answer questions appropriately. No neurological deficits noted. Ambulatory to triage. Son at bedside.

## 2012-02-13 NOTE — ED Notes (Signed)
Pt d/c'd from monitor, continuous pulse oximetry and blood pressure cuff; pt being transported to radiology department; family will wait for pt in room

## 2012-02-13 NOTE — ED Provider Notes (Signed)
History     CSN: [id number]  Arrival date & time 02/13/12  1191   First MD Initiated Contact with Patient 02/13/12 1049      Chief Complaint  Patient presents with  . Dizziness    (Consider location/radiation/quality/duration/timing/severity/associated sxs/prior treatment) HPI Comments: Is an 78 year old man who says his symptoms started last night. He felt like something was wrong, but no specific symptom. This morning he feels dizzy, as though he was unsteady. This lasted for a couple of minutes. He thinks the dizziness started in his sleep. He's had no vomiting. He feels better now. During this episode he had no difficulty with speech, was able to move is arms and legs, had noted numbness. He has had no prior similar episode. His son says that in the past he has had dizziness and has had to take medication for that. He has a prior history of coronary artery disease, COPD, diabetes, was admitted for urinary tract infection with altered mental status in July of 2013.  Patient is a 78 y.o. male presenting with neurologic complaint. The history is provided by the patient, a relative and medical records. No language interpreter was used.  Neurologic Problem The primary symptoms include dizziness. Primary symptoms comment: dizziness The symptoms began 2 to 6 hours ago. The episode lasted 2 minutes. The symptoms are improving. The neurological symptoms are diffuse.  Medical issues also include diabetes and hypertension.    Past Medical History  Diagnosis Date  . Heart disease   . Hypertension   . Hyperlipidemia   . Urine incontinence   . Alcohol problem drinking   . Emphysema of lung   . Type II diabetes mellitus   . Chronic kidney disease     "moderate"  . Arthritis     "all over"  . Gout   . Dementia   . UTI (lower urinary tract infection) 12/08/2011    "first one"    Past Surgical History  Procedure Date  . Coronary artery bypass graft 1994    Family History  Problem  Relation Age of Onset  . Arthritis Mother   . Cancer Father   . Hyperlipidemia Father   . Heart disease Father   . Hypertension Father   . Diabetes Father   . Stroke Sister   . Stroke Brother     History  Substance Use Topics  . Smoking status: Former Smoker -- 0.5 packs/day for 67 years    Types: Cigarettes    Quit date: 05/31/2008  . Smokeless tobacco: Never Used  . Alcohol Use: Yes     12/08/11 "last alcohol was ~ 20 yr ago"      Review of Systems  Constitutional: Negative.   HENT: Negative.   Eyes: Negative.   Respiratory: Negative.   Cardiovascular: Negative.   Gastrointestinal: Negative.   Genitourinary: Negative.   Musculoskeletal: Negative.   Skin: Negative.   Neurological: Positive for dizziness.  Psychiatric/Behavioral: Negative.     Allergies  Bee venom and Penicillins  Home Medications   Current Outpatient Rx  Name Route Sig Dispense Refill  . ALLOPURINOL 300 MG PO TABS Oral Take 300 mg by mouth every morning.     Marland Kitchen AMLODIPINE BESYLATE 5 MG PO TABS Oral Take 5 mg by mouth every morning.     . ASPIRIN 325 MG PO TABS Oral Take 325 mg by mouth every morning.     . BUDESONIDE-FORMOTEROL FUMARATE 160-4.5 MCG/ACT IN AERO Inhalation Inhale 2 puffs into the lungs 2 (two)  times daily. 1 Inhaler 3  . DONEPEZIL HCL 5 MG PO TABS Oral Take 1 tablet (5 mg total) by mouth at bedtime. 30 tablet 5  . MECLIZINE HCL 25 MG PO TABS Oral Take 25 mg by mouth 3 (three) times daily as needed. For dizziness    . MEMANTINE HCL 10 MG PO TABS Oral Take 10 mg by mouth 2 (two) times daily.      Marland Kitchen METOPROLOL TARTRATE 100 MG PO TABS Oral Take 1 tablet (100 mg total) by mouth 2 (two) times daily. 30 tablet 3  . SIMVASTATIN 10 MG PO TABS Oral Take 10 mg by mouth at bedtime.      . TAMSULOSIN HCL 0.4 MG PO CAPS Oral Take 1 capsule (0.4 mg total) by mouth daily. 30 capsule 5  . VITAMIN B-1 100 MG PO TABS Oral Take 100 mg by mouth daily.    Marland Kitchen VALSARTAN-HYDROCHLOROTHIAZIDE 320-25 MG PO  TABS Oral Take 1 tablet by mouth daily. 30 tablet 5    BP 162/66  Pulse 50  Temp 97.7 F (36.5 C) (Oral)  Resp 18  SpO2 93%  Physical Exam  Nursing note and vitals reviewed. Constitutional: He is oriented to person, place, and time. He appears well-developed and well-nourished. No distress.       Has rate 46 on monitor.   HENT:  Head: Normocephalic and atraumatic.  Right Ear: External ear normal.  Left Ear: External ear normal.  Mouth/Throat: Oropharynx is clear and moist.  Eyes: Conjunctivae normal and EOM are normal. Pupils are equal, round, and reactive to light.       No nystagmus   Neck: Normal range of motion. Neck supple.  Cardiovascular: Normal rate, regular rhythm and normal heart sounds.   Pulmonary/Chest: Effort normal and breath sounds normal.  Abdominal: Soft. Bowel sounds are normal.  Musculoskeletal: Normal range of motion. He exhibits no edema and no tenderness.  Neurological: He is alert and oriented to person, place, and time.       Speech clear.  No nystagmus.  Sensory and motor intact.  Seems mildly demented.  Skin: Skin is warm and dry.  Psychiatric: He has a normal mood and affect. His behavior is normal.    ED Course  Procedures (including critical care time)  Labs Reviewed  CBC WITH DIFFERENTIAL - Abnormal; Notable for the following:    RBC 4.03 (*)     Hemoglobin 12.2 (*)     HCT 37.2 (*)     Eosinophils Relative 6 (*)     All other components within normal limits  COMPREHENSIVE METABOLIC PANEL    Date: 02/13/2012  Rate: 47  Rhythm: sinus bradycardia  QRS Axis: left  Intervals: PR prolonged QRS:  Left ventricular hypertrophy  ST/T Wave abnormalities: Inverted T waves in lateral precordial leads.  Conduction Disutrbances:first-degree A-V block   Narrative Interpretation: Abnormal EKG  Old EKG Reviewed: unchanged  11:35 AM Pt seen --> physical exam performed.  Lab workup ordered.  Results for orders placed during the hospital  encounter of 02/13/12  CBC WITH DIFFERENTIAL      Component Value Range   WBC 6.6  4.0 - 10.5 K/uL   RBC 4.03 (*) 4.22 - 5.81 MIL/uL   Hemoglobin 12.2 (*) 13.0 - 17.0 g/dL   HCT 16.1 (*) 09.6 - 04.5 %   MCV 92.3  78.0 - 100.0 fL   MCH 30.3  26.0 - 34.0 pg   MCHC 32.8  30.0 - 36.0 g/dL  RDW 15.2  11.5 - 15.5 %   Platelets 165  150 - 400 K/uL   Neutrophils Relative 55  43 - 77 %   Neutro Abs 3.6  1.7 - 7.7 K/uL   Lymphocytes Relative 29  12 - 46 %   Lymphs Abs 1.9  0.7 - 4.0 K/uL   Monocytes Relative 10  3 - 12 %   Monocytes Absolute 0.6  0.1 - 1.0 K/uL   Eosinophils Relative 6 (*) 0 - 5 %   Eosinophils Absolute 0.4  0.0 - 0.7 K/uL   Basophils Relative 0  0 - 1 %   Basophils Absolute 0.0  0.0 - 0.1 K/uL  COMPREHENSIVE METABOLIC PANEL      Component Value Range   Sodium 137  135 - 145 mEq/L   Potassium 3.6  3.5 - 5.1 mEq/L   Chloride 101  96 - 112 mEq/L   CO2 23  19 - 32 mEq/L   Glucose, Bld 136 (*) 70 - 99 mg/dL   BUN 30 (*) 6 - 23 mg/dL   Creatinine, Ser 0.98 (*) 0.50 - 1.35 mg/dL   Calcium 11.9  8.4 - 14.7 mg/dL   Total Protein 8.3  6.0 - 8.3 g/dL   Albumin 3.9  3.5 - 5.2 g/dL   AST 28  0 - 37 U/L   ALT 19  0 - 53 U/L   Alkaline Phosphatase 69  39 - 117 U/L   Total Bilirubin 0.4  0.3 - 1.2 mg/dL   GFR calc non Af Amer 40 (*) >90 mL/min   GFR calc Af Amer 46 (*) >90 mL/min  APTT      Component Value Range   aPTT 34  24 - 37 seconds  PROTIME-INR      Component Value Range   Prothrombin Time 14.0  11.6 - 15.2 seconds   INR 1.06  0.00 - 1.49  URINALYSIS, ROUTINE W REFLEX MICROSCOPIC      Component Value Range   Color, Urine YELLOW  YELLOW   APPearance CLEAR  CLEAR   Specific Gravity, Urine 1.014  1.005 - 1.030   pH 5.5  5.0 - 8.0   Glucose, UA NEGATIVE  NEGATIVE mg/dL   Hgb urine dipstick TRACE (*) NEGATIVE   Bilirubin Urine NEGATIVE  NEGATIVE   Ketones, ur NEGATIVE  NEGATIVE mg/dL   Protein, ur 30 (*) NEGATIVE mg/dL   Urobilinogen, UA 0.2  0.0 - 1.0 mg/dL    Nitrite NEGATIVE  NEGATIVE   Leukocytes, UA NEGATIVE  NEGATIVE  POCT I-STAT TROPONIN I      Component Value Range   Troponin i, poc 0.01  0.00 - 0.08 ng/mL   Comment 3           URINE MICROSCOPIC-ADD ON      Component Value Range   Squamous Epithelial / LPF RARE  RARE   RBC / HPF 0-2  <3 RBC/hpf   Dg Chest 2 View  02/13/2012  *RADIOLOGY REPORT*  Clinical Data: Bradycardia  CHEST - 2 VIEW  Comparison: 12/08/2011  Findings: Mild cardiomegaly.  Mild vascular congestion.  Mild basilar atelectasis.  No sign of interstitial edema.  No pneumothorax.  No pleural effusion. Focal opacity at the right upper lung zone laterally likely reflects the overlying external object.  IMPRESSION: Cardiomegaly and vascular congestion.   Original Report Authenticated By: Donavan Burnet, M.D.     Lab tests showed mild renal insufficiency.  He should continue on his blood pressure medicine  and maintain his hydration.  He can take meclizine, which he already has, for dizziness.  Followup in the office with Dr. Beverely Low, his family physician.  1. Dizziness   2. Renal insufficiency      Carleene Cooper III, MD 02/13/12 [phone]

## 2012-02-15 LAB — URINE CULTURE: Colony Count: NO GROWTH

## 2012-02-17 ENCOUNTER — Encounter: Payer: Self-pay | Admitting: Family Medicine

## 2012-02-17 ENCOUNTER — Ambulatory Visit (INDEPENDENT_AMBULATORY_CARE_PROVIDER_SITE_OTHER): Payer: Medicare Other | Admitting: Family Medicine

## 2012-02-17 VITALS — BP 118/78 | HR 59 | Temp 97.5°F | Ht 64.5 in | Wt 192.0 lb

## 2012-02-17 DIAGNOSIS — R42 Dizziness and giddiness: Secondary | ICD-10-CM

## 2012-02-17 DIAGNOSIS — R7989 Other specified abnormal findings of blood chemistry: Secondary | ICD-10-CM

## 2012-02-17 DIAGNOSIS — R799 Abnormal finding of blood chemistry, unspecified: Secondary | ICD-10-CM

## 2012-02-17 NOTE — Assessment & Plan Note (Signed)
New to provider.  Pt w/ hx of similar.  Pt's ER w/u was negative on Sunday.  Has been asymptomatic since.  Using meclizine as needed.  Reviewed w/ son that if pt again complains of dizziness he should check sugar and if low- feed.  If normal, give meclizine.  Son expressed understanding and is in agreement.  Will follow.

## 2012-02-17 NOTE — Progress Notes (Signed)
  Subjective:    Patient ID: Lucas Obrien, male    DOB: 02-04-1925, 78 y.o.   MRN: [id number]  HPI Dizziness- went to ER on Sunday 9/15 for dizziness.  Has hx of vertigo.  Has been asymptomatic since going to ER.  ER dx'd w/ recurrent vertigo and recommended using Meclizine as needed.  Cr was 1.5, pt reports he's drinking water.  Reviewed CBG log, sugars running 110-130s.  No CP, SOB, HAs, visual changes, edema.   Review of Systems For ROS see HPI     Objective:   Physical Exam  Vitals reviewed. Constitutional: He appears well-developed and well-nourished. No distress.  HENT:  Head: Normocephalic and atraumatic.  Eyes: Conjunctivae normal and EOM are normal. Pupils are equal, round, and reactive to light.  Neck: Normal range of motion. Neck supple.  Cardiovascular: Normal rate, regular rhythm and normal heart sounds.   Pulmonary/Chest: Effort normal. No respiratory distress. He has no wheezes. He has no rales.  Musculoskeletal: He exhibits no edema.  Neurological: He is alert. No cranial nerve deficit. Coordination normal.  Skin: Skin is warm and dry.  Psychiatric: He has a normal mood and affect. His behavior is normal.          Assessment & Plan:

## 2012-02-17 NOTE — Patient Instructions (Addendum)
Follow up as scheduled Continue to drink plenty of fluids Use the meclizine as needed for dizziness- 1st check his sugar to make sure it's not low, and then use the meclizine Call with any questions or concerns Happy Early [place] Ouch!

## 2012-02-17 NOTE — Assessment & Plan Note (Signed)
Stable.  Checked in ER on Sunday.  Encouraged son to continue to push fluids.  No changes at this time.

## 2012-03-27 ENCOUNTER — Encounter: Payer: Self-pay | Admitting: Family Medicine

## 2012-03-27 ENCOUNTER — Ambulatory Visit (INDEPENDENT_AMBULATORY_CARE_PROVIDER_SITE_OTHER): Payer: Medicare Other | Admitting: Family Medicine

## 2012-03-27 VITALS — BP 124/72 | HR 50 | Temp 97.5°F | Ht 64.0 in | Wt 195.3 lb

## 2012-03-27 DIAGNOSIS — E119 Type 2 diabetes mellitus without complications: Secondary | ICD-10-CM

## 2012-03-27 DIAGNOSIS — R799 Abnormal finding of blood chemistry, unspecified: Secondary | ICD-10-CM

## 2012-03-27 DIAGNOSIS — E785 Hyperlipidemia, unspecified: Secondary | ICD-10-CM

## 2012-03-27 DIAGNOSIS — R7989 Other specified abnormal findings of blood chemistry: Secondary | ICD-10-CM

## 2012-03-27 DIAGNOSIS — Z23 Encounter for immunization: Secondary | ICD-10-CM

## 2012-03-27 LAB — HEPATIC FUNCTION PANEL
Alkaline Phosphatase: 51 U/L (ref 39–117)
Bilirubin, Direct: 0 mg/dL (ref 0.0–0.3)
Total Bilirubin: 0.5 mg/dL (ref 0.3–1.2)
Total Protein: 7.5 g/dL (ref 6.0–8.3)

## 2012-03-27 LAB — LIPID PANEL
HDL: 41.1 mg/dL (ref >39.00)
LDL Cholesterol: 72 mg/dL (ref 0–99)
Total CHOL/HDL Ratio: 3
Triglycerides: 84 mg/dL (ref 0.0–149.0)

## 2012-03-27 LAB — BASIC METABOLIC PANEL
CO2: 21 mEq/L (ref 19–32)
Calcium: 9.5 mg/dL (ref 8.4–10.5)
Chloride: 109 mEq/L (ref 96–112)
Creatinine, Ser: 1.5 mg/dL (ref 0.4–1.5)
Sodium: 138 mEq/L (ref 135–145)

## 2012-03-27 LAB — HEMOGLOBIN A1C: Hgb A1c MFr Bld: 6.4 % (ref 4.6–6.5)

## 2012-03-27 NOTE — Progress Notes (Signed)
  Subjective:    Patient ID: Lucas Obrien, male    DOB: 09-19-1924, 78 y.o.   MRN: [id number]  HPI DM- chronic problem, recently stopped Metformin due to elevated Cr.  Now attempting to control w/ diet.  UTD on eye exam.  No CP, SOB, HAs, edema, numbness.  Hyperlipidemia- chronic problem, on Zocor.  Denies abd pain, N/V, myalgias.    Elevated Cr- stopped Metformin at last visit.  On Diovan HCT which may need adjusted pending labs.   Review of Systems For ROS see HPI     Objective:   Physical Exam  Vitals reviewed. Constitutional: He is oriented to person, place, and time. He appears well-developed and well-nourished. No distress.  HENT:  Head: Normocephalic and atraumatic.  Eyes: Conjunctivae normal and EOM are normal. Pupils are equal, round, and reactive to light.  Neck: Normal range of motion. Neck supple. No thyromegaly present.  Cardiovascular: Normal rate, regular rhythm, normal heart sounds and intact distal pulses.   No murmur heard. Pulmonary/Chest: Effort normal and breath sounds normal. No respiratory distress.  Abdominal: Soft. Bowel sounds are normal. He exhibits no distension.  Musculoskeletal: He exhibits no edema.  Lymphadenopathy:    He has no cervical adenopathy.  Neurological: He is alert and oriented to person, place, and time. No cranial nerve deficit.  Skin: Skin is warm and dry.  Psychiatric: He has a normal mood and affect. His behavior is normal.          Assessment & Plan:

## 2012-03-27 NOTE — Assessment & Plan Note (Signed)
Relatively recent for pt.  Stopped Metformin at last visit and may need to stop ARB/HCTZ depending on labs today.  Repeat labs and make adjustments prn.  May need referral to nephrology if level keeps climbing.

## 2012-03-27 NOTE — Assessment & Plan Note (Signed)
Chronic problem, no longer on Metformin due to renal insufficiency.  UTD on eye exam.  Attempting to control sugar w/ diet.  Check labs.  Start meds prn.  Will follow closely.

## 2012-03-27 NOTE — Patient Instructions (Addendum)
Follow up in 3 months for diabetes and schedule your physical for 6 months We'll notify you of your lab results and make any changes if needed Keep up the good work!!! Call with any questions or concerns Happy [place] Birthday!!!

## 2012-03-27 NOTE — Assessment & Plan Note (Signed)
Chronic problem.  Tolerating med w/out difficulty.  Check labs.  Adjust meds prn  

## 2012-03-31 ENCOUNTER — Telehealth: Payer: Self-pay | Admitting: Family Medicine

## 2012-03-31 MED ORDER — SIMVASTATIN 10 MG PO TABS: 10.0000 mg | ORAL_TABLET | Freq: Every day | ORAL | Status: DC

## 2012-03-31 NOTE — Telephone Encounter (Signed)
RX sent

## 2012-03-31 NOTE — Telephone Encounter (Signed)
Refill: simvastatin 10 mg tablet. Take 1 tablet by mouth once daily. Qty 30. Last fill 03-05-12

## 2012-04-07 ENCOUNTER — Other Ambulatory Visit: Payer: Self-pay | Admitting: Family Medicine

## 2012-04-07 DIAGNOSIS — R0982 Postnasal drip: Secondary | ICD-10-CM

## 2012-04-07 MED ORDER — IPRATROPIUM BROMIDE 0.06 % NA SOLN: 1.0000 | Freq: Three times a day (TID) | NASAL | Status: DC

## 2012-04-07 NOTE — Telephone Encounter (Signed)
Refill Ipratropium 0.06% Spray #15 wt/5-refills Instill 2-sprays into each nostril three times a day IF NEEDED FOR RUNNY NOSE--last fill 03.01.13--last ov 10.28.13 DM F/u

## 2012-04-07 NOTE — Telephone Encounter (Signed)
Ok x 6 

## 2012-04-07 NOTE — Telephone Encounter (Signed)
Med filled.  

## 2012-04-07 NOTE — Telephone Encounter (Signed)
Please advise 

## 2012-04-17 ENCOUNTER — Other Ambulatory Visit: Payer: Self-pay | Admitting: Family Medicine

## 2012-04-17 ENCOUNTER — Telehealth: Payer: Self-pay | Admitting: Family Medicine

## 2012-04-17 MED ORDER — DONEPEZIL HCL 5 MG PO TABS: 5.0000 mg | ORAL_TABLET | Freq: Every day | ORAL | Status: DC

## 2012-04-17 NOTE — Telephone Encounter (Signed)
Pt son indicated that the rx that he has is from previous physician back in [place] so he did not want to fill this one since he is no longer seeing them. Pt son states that he has 4 pills remaining after this week dose is finish.Pt son also notes that Pt had not been taking this med for a while but he has started him back on it recently.

## 2012-04-17 NOTE — Telephone Encounter (Signed)
Rx sent 

## 2012-04-17 NOTE — Telephone Encounter (Signed)
Does he have any refills remaining on the original script?  He should not have required 400 meclizine since June

## 2012-04-17 NOTE — Telephone Encounter (Signed)
Last OV 03/27/12

## 2012-04-17 NOTE — Telephone Encounter (Signed)
Refill: Donepezil hcl 5 mg tablet. Take 1 tablet by mouth at bedtime. Qty 30. Last fill 03-09-12

## 2012-04-17 NOTE — Telephone Encounter (Signed)
refill Meclizine HCl (Tab) 25 MG #100 wt/3-refills Take 25 mg by mouth 3 (three) times daily as needed. For dizziness   last fill 6.2.13 last ov   follow up 10.28.13

## 2012-04-17 NOTE — Telephone Encounter (Signed)
Ok for #90, no refills 

## 2012-04-18 MED ORDER — MECLIZINE HCL 25 MG PO TABS: 25.0000 mg | ORAL_TABLET | Freq: Three times a day (TID) | ORAL | Status: DC | PRN

## 2012-04-18 NOTE — Telephone Encounter (Signed)
Rx sent 

## 2012-06-01 ENCOUNTER — Other Ambulatory Visit: Payer: Self-pay | Admitting: Family Medicine

## 2012-06-01 MED ORDER — VALSARTAN-HYDROCHLOROTHIAZIDE 320-25 MG PO TABS: 1.0000 | ORAL_TABLET | Freq: Every day | ORAL | Status: DC

## 2012-06-01 MED ORDER — TAMSULOSIN HCL 0.4 MG PO CAPS: 0.4000 mg | ORAL_CAPSULE | Freq: Every day | ORAL | Status: DC

## 2012-06-01 MED ORDER — METOPROLOL TARTRATE 100 MG PO TABS: 100.0000 mg | ORAL_TABLET | Freq: Two times a day (BID) | ORAL | Status: DC

## 2012-06-01 NOTE — Telephone Encounter (Signed)
Refill done.  

## 2012-06-01 NOTE — Telephone Encounter (Signed)
refills x 3  last ov wt/labs 10.28.13 DM Follow up   1-Metoprolol tartrate 100mg  tab #60 we/3-refills, take one tablet by mouth twice daily -- last fill 12.6.13  2-Tamsulosin HCL 0.4 MG Capsule #30 wt/5-refills, take 1 capsule by mouth once daily -- last fill 12.6.13  3-Vasartan HCTZ 320-25 MG Tab #30 wt/5-refills Take one tablet by mouth once daily -- last fill 12.6.13

## 2012-06-13 ENCOUNTER — Other Ambulatory Visit: Payer: Self-pay | Admitting: Family Medicine

## 2012-06-13 MED ORDER — BUDESONIDE-FORMOTEROL FUMARATE 160-4.5 MCG/ACT IN AERO: 2.0000 | INHALATION_SPRAY | Freq: Two times a day (BID) | RESPIRATORY_TRACT | Status: DC

## 2012-06-13 NOTE — Telephone Encounter (Signed)
Refill done.  

## 2012-06-13 NOTE — Telephone Encounter (Signed)
refill SYMBICORT 160-4.5 MCG/ACT Inhale 2 puffs into the lungs 2 (two) times daily. #10.2 wt/3-refills last fill 12.13.13

## 2012-06-27 ENCOUNTER — Encounter: Payer: Self-pay | Admitting: Family Medicine

## 2012-06-27 ENCOUNTER — Ambulatory Visit (INDEPENDENT_AMBULATORY_CARE_PROVIDER_SITE_OTHER): Payer: Medicare Other | Admitting: Family Medicine

## 2012-06-27 VITALS — BP 134/60 | HR 57 | Temp 98.6°F | Wt 196.0 lb

## 2012-06-27 DIAGNOSIS — I10 Essential (primary) hypertension: Secondary | ICD-10-CM

## 2012-06-27 DIAGNOSIS — E1129 Type 2 diabetes mellitus with other diabetic kidney complication: Secondary | ICD-10-CM

## 2012-06-27 DIAGNOSIS — E1165 Type 2 diabetes mellitus with hyperglycemia: Secondary | ICD-10-CM

## 2012-06-27 DIAGNOSIS — R799 Abnormal finding of blood chemistry, unspecified: Secondary | ICD-10-CM

## 2012-06-27 DIAGNOSIS — R7989 Other specified abnormal findings of blood chemistry: Secondary | ICD-10-CM

## 2012-06-27 DIAGNOSIS — N058 Unspecified nephritic syndrome with other morphologic changes: Secondary | ICD-10-CM

## 2012-06-27 LAB — BASIC METABOLIC PANEL
Calcium: 9.5 mg/dL (ref 8.4–10.5)
Creatinine, Ser: 1.6 mg/dL — ABNORMAL HIGH (ref 0.4–1.5)
GFR: 53.83 mL/min — ABNORMAL LOW (ref >60.00)
Sodium: 139 mEq/L (ref 135–145)

## 2012-06-27 LAB — HEMOGLOBIN A1C: Hgb A1c MFr Bld: 6.6 % — ABNORMAL HIGH (ref 4.6–6.5)

## 2012-06-27 NOTE — Patient Instructions (Addendum)
We'll see you for your physical in 3 months Keep up the good work! We'll notify you of your lab results and make any changes if needed Enjoy the snow!!!

## 2012-06-27 NOTE — Progress Notes (Signed)
  Subjective:    Patient ID: Lucas Obrien, male    DOB: 06/19/1924, 79 y.o.   MRN: [id number]  HPI DM- chronic problem.  Stopped metformin at last visit due to renal insufficiency.  Has been attempting to control w/ diet w/ exercise.  CBGs ranging 90-150s.  UTD on eye exam.  Denies symptomatic lows.  No CP, SOB, HAs, edema.  No numbness/tingling.  HTN- chronic problem.  Adequate control today on Norvasc, Metoprolol, Diovan HCT.  Asymptomatic.   Review of Systems For ROS see HPI     Objective:   Physical Exam  Vitals reviewed. Constitutional: He appears well-developed and well-nourished. No distress.  HENT:  Head: Normocephalic and atraumatic.  Eyes: Conjunctivae normal and EOM are normal. Pupils are equal, round, and reactive to light.  Neck: Normal range of motion. Neck supple. No thyromegaly present.  Cardiovascular: Normal rate, regular rhythm, normal heart sounds and intact distal pulses.   No murmur heard. Pulmonary/Chest: Effort normal and breath sounds normal. No respiratory distress.  Abdominal: Soft. Bowel sounds are normal. He exhibits no distension.  Musculoskeletal: He exhibits no edema.  Lymphadenopathy:    He has no cervical adenopathy.  Neurological: He is alert. No cranial nerve deficit.  Skin: Skin is warm and dry.  Psychiatric: He has a normal mood and affect. His behavior is normal.          Assessment & Plan:

## 2012-06-27 NOTE — Assessment & Plan Note (Signed)
Chronic problem.  Adequate control.  Asymptomatic.  No changes. 

## 2012-06-27 NOTE — Assessment & Plan Note (Signed)
Metformin stopped at last visit.  Check labs and assess for improvement or deterioration.  If worse, will need renal referral.

## 2012-06-27 NOTE — Assessment & Plan Note (Signed)
Chronic problem.  Now attempting to control w/ diet and exercise after stopping Metformin for renal insufficiency.  UTD on eye exam.  Check labs.  Adjust meds prn

## 2012-07-10 ENCOUNTER — Telehealth: Payer: Self-pay | Admitting: Family Medicine

## 2012-07-10 DIAGNOSIS — R4182 Altered mental status, unspecified: Secondary | ICD-10-CM

## 2012-07-10 MED ORDER — DONEPEZIL HCL 5 MG PO TABS: 5.0000 mg | ORAL_TABLET | Freq: Every day | ORAL | Status: DC

## 2012-07-10 NOTE — Telephone Encounter (Signed)
refill Donepezil HCl (Tab) 5 MG Take 1 tablet (5 mg total) by mouth at bedtime. #30 wt-2 refill last fill 11.18.13

## 2012-07-10 NOTE — Telephone Encounter (Signed)
Refill for aricept sent to [facility]

## 2012-07-19 ENCOUNTER — Telehealth: Payer: Self-pay | Admitting: Family Medicine

## 2012-07-19 NOTE — Telephone Encounter (Signed)
refill Accu-check aviva plus test strip #100 wt/12-refills TEST twice a day as directed (Please include DX code on RX for Medicare billing) last fill 1.26.14

## 2012-07-19 NOTE — Telephone Encounter (Signed)
refill Meclizine HCl (Tab) 25 MG Take 1 tablet (25 mg total) by mouth 3 (three) times daily as needed. For dizziness #90 last fill 11.19.13

## 2012-07-20 MED ORDER — GLUCOSE BLOOD VI STRP: ORAL_STRIP | Status: DC

## 2012-07-20 NOTE — Telephone Encounter (Signed)
Please advise on the RF request for the Meclizine.  Last OV for Vertigo:02-17-12.//AB/CMA   Rx for test strip has been sent to the pharmacy([facility]) by e-script.//AB/CMA

## 2012-07-20 NOTE — Telephone Encounter (Signed)
Ok for 90

## 2012-07-21 MED ORDER — MECLIZINE HCL 25 MG PO TABS: 25.0000 mg | ORAL_TABLET | Freq: Three times a day (TID) | ORAL | Status: DC | PRN

## 2012-07-21 NOTE — Telephone Encounter (Signed)
Rx sent to the pharmacy([facility]) by e-script.//AB/CMA

## 2012-07-25 ENCOUNTER — Telehealth: Payer: Self-pay | Admitting: Family Medicine

## 2012-07-25 MED ORDER — TAMSULOSIN HCL 0.4 MG PO CAPS: 0.4000 mg | ORAL_CAPSULE | Freq: Every day | ORAL | Status: DC

## 2012-07-25 MED ORDER — METOPROLOL TARTRATE 100 MG PO TABS: 100.0000 mg | ORAL_TABLET | Freq: Two times a day (BID) | ORAL | Status: DC

## 2012-07-25 MED ORDER — VALSARTAN-HYDROCHLOROTHIAZIDE 320-25 MG PO TABS: 1.0000 | ORAL_TABLET | Freq: Every day | ORAL | Status: DC

## 2012-07-25 NOTE — Telephone Encounter (Signed)
refills x 3  1-Tamsulosin HCL 0.4MG  Capsule #30 wt/1-refill Take 1 capsule by mouth once daily last fill 1.29.14  2-valsartan hctz 320-25 mg tab #30 wt/1-refill take 1 tablet by mouth once daily last fill 1.29.14  3-metoprolol tartrate 100mg  tab #60 wt/1-refill take 1 tablet by  Mouth twice a day last fill 1.29.14

## 2012-07-25 NOTE — Telephone Encounter (Signed)
Rx sent to the pharmacy([facility]) by e-script.//AB/CMA

## 2012-08-15 ENCOUNTER — Telehealth: Payer: Self-pay | Admitting: *Deleted

## 2012-08-15 ENCOUNTER — Telehealth: Payer: Self-pay | Admitting: Family Medicine

## 2012-08-15 NOTE — Telephone Encounter (Signed)
Pt walked into office stating that he lost his symbicort and is going ot the pharmacy to get a refill. Pt request sample of med,Per Dr Laury Axon ok to give pt sample of med

## 2012-08-15 NOTE — Telephone Encounter (Signed)
Refill: Symbicort 160-4.5 mcg inhaler. Inhale 2 puffs by mouth twice a day. Qty 10.2. Last fill 07-11-12

## 2012-08-16 MED ORDER — BUDESONIDE-FORMOTEROL FUMARATE 160-4.5 MCG/ACT IN AERO: 2.0000 | INHALATION_SPRAY | Freq: Two times a day (BID) | RESPIRATORY_TRACT | Status: DC

## 2012-08-21 ENCOUNTER — Inpatient Hospital Stay (HOSPITAL_COMMUNITY)
Admission: EM | Admit: 2012-08-21 | Discharge: 2012-08-24 | DRG: 178 | Disposition: A | Discharge status: Home / Self Care | Payer: Medicare Other | Attending: Internal Medicine | Admitting: Internal Medicine

## 2012-08-21 ENCOUNTER — Other Ambulatory Visit: Payer: Self-pay | Admitting: Family Medicine

## 2012-08-21 ENCOUNTER — Emergency Department (HOSPITAL_COMMUNITY): Payer: Medicare Other

## 2012-08-21 ENCOUNTER — Encounter (HOSPITAL_COMMUNITY): Payer: Self-pay | Admitting: Emergency Medicine

## 2012-08-21 DIAGNOSIS — E1129 Type 2 diabetes mellitus with other diabetic kidney complication: Secondary | ICD-10-CM | POA: Diagnosis present

## 2012-08-21 DIAGNOSIS — F0390 Unspecified dementia without behavioral disturbance: Secondary | ICD-10-CM | POA: Diagnosis present

## 2012-08-21 DIAGNOSIS — Z8249 Family history of ischemic heart disease and other diseases of the circulatory system: Secondary | ICD-10-CM

## 2012-08-21 DIAGNOSIS — R509 Fever, unspecified: Secondary | ICD-10-CM

## 2012-08-21 DIAGNOSIS — I2510 Atherosclerotic heart disease of native coronary artery without angina pectoris: Secondary | ICD-10-CM | POA: Diagnosis present

## 2012-08-21 DIAGNOSIS — R0982 Postnasal drip: Secondary | ICD-10-CM

## 2012-08-21 DIAGNOSIS — N400 Enlarged prostate without lower urinary tract symptoms: Secondary | ICD-10-CM | POA: Diagnosis present

## 2012-08-21 DIAGNOSIS — N390 Urinary tract infection, site not specified: Secondary | ICD-10-CM

## 2012-08-21 DIAGNOSIS — Z8261 Family history of arthritis: Secondary | ICD-10-CM

## 2012-08-21 DIAGNOSIS — I10 Essential (primary) hypertension: Secondary | ICD-10-CM

## 2012-08-21 DIAGNOSIS — N189 Chronic kidney disease, unspecified: Secondary | ICD-10-CM | POA: Diagnosis present

## 2012-08-21 DIAGNOSIS — J690 Pneumonitis due to inhalation of food and vomit: Principal | ICD-10-CM | POA: Diagnosis present

## 2012-08-21 DIAGNOSIS — R42 Dizziness and giddiness: Secondary | ICD-10-CM

## 2012-08-21 DIAGNOSIS — I129 Hypertensive chronic kidney disease with stage 1 through stage 4 chronic kidney disease, or unspecified chronic kidney disease: Secondary | ICD-10-CM | POA: Diagnosis present

## 2012-08-21 DIAGNOSIS — N058 Unspecified nephritic syndrome with other morphologic changes: Secondary | ICD-10-CM | POA: Diagnosis present

## 2012-08-21 DIAGNOSIS — R7989 Other specified abnormal findings of blood chemistry: Secondary | ICD-10-CM

## 2012-08-21 DIAGNOSIS — Z823 Family history of stroke: Secondary | ICD-10-CM

## 2012-08-21 DIAGNOSIS — Z951 Presence of aortocoronary bypass graft: Secondary | ICD-10-CM

## 2012-08-21 DIAGNOSIS — R0902 Hypoxemia: Secondary | ICD-10-CM | POA: Diagnosis present

## 2012-08-21 DIAGNOSIS — J441 Chronic obstructive pulmonary disease with (acute) exacerbation: Secondary | ICD-10-CM | POA: Diagnosis present

## 2012-08-21 DIAGNOSIS — E785 Hyperlipidemia, unspecified: Secondary | ICD-10-CM | POA: Diagnosis present

## 2012-08-21 DIAGNOSIS — D72829 Elevated white blood cell count, unspecified: Secondary | ICD-10-CM

## 2012-08-21 DIAGNOSIS — E1165 Type 2 diabetes mellitus with hyperglycemia: Secondary | ICD-10-CM | POA: Diagnosis present

## 2012-08-21 DIAGNOSIS — Z87891 Personal history of nicotine dependence: Secondary | ICD-10-CM

## 2012-08-21 DIAGNOSIS — R4182 Altered mental status, unspecified: Secondary | ICD-10-CM

## 2012-08-21 DIAGNOSIS — F1010 Alcohol abuse, uncomplicated: Secondary | ICD-10-CM | POA: Diagnosis present

## 2012-08-21 DIAGNOSIS — Z833 Family history of diabetes mellitus: Secondary | ICD-10-CM

## 2012-08-21 DIAGNOSIS — J189 Pneumonia, unspecified organism: Secondary | ICD-10-CM

## 2012-08-21 HISTORY — DX: Shortness of breath: R06.02

## 2012-08-21 HISTORY — DX: Pneumonia, unspecified organism: J18.9

## 2012-08-21 LAB — CBC
HCT: 33.8 % — ABNORMAL LOW (ref 39.0–52.0)
Hemoglobin: 11.6 g/dL — ABNORMAL LOW (ref 13.0–17.0)
Hemoglobin: 12 g/dL — ABNORMAL LOW (ref 13.0–17.0)
MCH: 30.8 pg (ref 26.0–34.0)
MCH: 30.8 pg (ref 26.0–34.0)
MCHC: 34.3 g/dL (ref 30.0–36.0)
MCV: 89.7 fL (ref 78.0–100.0)
Platelets: 127 K/uL — ABNORMAL LOW (ref 150–400)
Platelets: 149 10*3/uL — ABNORMAL LOW (ref 150–400)
RBC: 3.77 MIL/uL — ABNORMAL LOW (ref 4.22–5.81)
RBC: 3.89 MIL/uL — ABNORMAL LOW (ref 4.22–5.81)
RDW: 14.7 % (ref 11.5–15.5)
WBC: 8 K/uL (ref 4.0–10.5)
WBC: 7.9 10*3/uL (ref 4.0–10.5)

## 2012-08-21 LAB — CREATININE, SERUM
Creatinine, Ser: 1.36 mg/dL — ABNORMAL HIGH (ref 0.50–1.35)
GFR calc Af Amer: 53 mL/min — ABNORMAL LOW (ref >90)
GFR calc non Af Amer: 46 mL/min — ABNORMAL LOW (ref >90)

## 2012-08-21 LAB — BASIC METABOLIC PANEL WITH GFR
BUN: 30 mg/dL — ABNORMAL HIGH (ref 6–23)
CO2: 21 meq/L (ref 19–32)
Calcium: 9.7 mg/dL (ref 8.4–10.5)
Chloride: 104 meq/L (ref 96–112)
Creatinine, Ser: 1.35 mg/dL (ref 0.50–1.35)
GFR calc Af Amer: 54 mL/min — ABNORMAL LOW
GFR calc non Af Amer: 46 mL/min — ABNORMAL LOW
Glucose, Bld: 146 mg/dL — ABNORMAL HIGH (ref 70–99)
Potassium: 3.7 meq/L (ref 3.5–5.1)
Sodium: 138 meq/L (ref 135–145)

## 2012-08-21 LAB — STREP PNEUMONIAE URINARY ANTIGEN: Strep Pneumo Urinary Antigen: NEGATIVE

## 2012-08-21 LAB — POCT I-STAT TROPONIN I: Troponin i, poc: 0.07 ng/mL (ref 0.00–0.08)

## 2012-08-21 LAB — GLUCOSE, CAPILLARY: Glucose-Capillary: 178 mg/dL — ABNORMAL HIGH (ref 70–99)

## 2012-08-21 LAB — APTT: aPTT: 34 seconds (ref 24–37)

## 2012-08-21 LAB — PROTIME-INR
INR: 1.16 (ref 0.00–1.49)
Prothrombin Time: 14.6 seconds (ref 11.6–15.2)

## 2012-08-21 MED ORDER — DEXTROSE 5 % IV SOLN
500.0000 mg | Freq: Once | INTRAVENOUS | Status: DC
  Filled 2012-08-21: qty 500

## 2012-08-21 MED ORDER — AMLODIPINE BESYLATE 5 MG PO TABS
5.0000 mg | ORAL_TABLET | Freq: Every morning | ORAL | Status: DC
  Administered 2012-08-21: 5 mg via ORAL
  Filled 2012-08-21 (×2): qty 1

## 2012-08-21 MED ORDER — VITAMIN B-1 100 MG PO TABS
100.0000 mg | ORAL_TABLET | Freq: Every day | ORAL | Status: DC
  Administered 2012-08-21 – 2012-08-24 (×4): 100 mg via ORAL
  Filled 2012-08-21 (×4): qty 1

## 2012-08-21 MED ORDER — ONDANSETRON HCL 4 MG/2ML IJ SOLN: 4.0000 mg | Freq: Four times a day (QID) | INTRAMUSCULAR | Status: DC | PRN

## 2012-08-21 MED ORDER — POLYETHYLENE GLYCOL 3350 17 G PO PACK
17.0000 g | PACK | Freq: Every day | ORAL | Status: DC | PRN
  Filled 2012-08-21: qty 1

## 2012-08-21 MED ORDER — METOPROLOL TARTRATE 100 MG PO TABS
100.0000 mg | ORAL_TABLET | Freq: Two times a day (BID) | ORAL | Status: DC
  Administered 2012-08-21 – 2012-08-24 (×6): 100 mg via ORAL
  Filled 2012-08-21 (×7): qty 1

## 2012-08-21 MED ORDER — ALLOPURINOL 300 MG PO TABS
300.0000 mg | ORAL_TABLET | Freq: Every morning | ORAL | Status: DC
  Administered 2012-08-21 – 2012-08-24 (×4): 300 mg via ORAL
  Filled 2012-08-21 (×4): qty 1

## 2012-08-21 MED ORDER — METHYLPREDNISOLONE SODIUM SUCC 125 MG IJ SOLR
60.0000 mg | Freq: Two times a day (BID) | INTRAMUSCULAR | Status: DC
  Administered 2012-08-21: 16:00:00 via INTRAVENOUS
  Administered 2012-08-22: 60 mg via INTRAVENOUS
  Filled 2012-08-21 (×4): qty 0.96

## 2012-08-21 MED ORDER — ALBUTEROL SULFATE (5 MG/ML) 0.5% IN NEBU: 2.5000 mg | INHALATION_SOLUTION | RESPIRATORY_TRACT | Status: DC | PRN

## 2012-08-21 MED ORDER — LEVOFLOXACIN IN D5W 750 MG/150ML IV SOLN
750.0000 mg | INTRAVENOUS | Status: DC
  Administered 2012-08-21 – 2012-08-23 (×2): 750 mg via INTRAVENOUS
  Filled 2012-08-21 (×2): qty 150

## 2012-08-21 MED ORDER — ONDANSETRON HCL 4 MG PO TABS: 4.0000 mg | ORAL_TABLET | Freq: Four times a day (QID) | ORAL | Status: DC | PRN

## 2012-08-21 MED ORDER — DEXTROSE-NACL 5-0.45 % IV SOLN
INTRAVENOUS | Status: DC
  Administered 2012-08-21 – 2012-08-24 (×4): via INTRAVENOUS

## 2012-08-21 MED ORDER — BIOTENE DRY MOUTH MT LIQD
15.0000 mL | Freq: Two times a day (BID) | OROMUCOSAL | Status: DC
  Administered 2012-08-21 – 2012-08-23 (×5): 15 mL via OROMUCOSAL

## 2012-08-21 MED ORDER — BUDESONIDE-FORMOTEROL FUMARATE 160-4.5 MCG/ACT IN AERO
2.0000 | INHALATION_SPRAY | Freq: Two times a day (BID) | RESPIRATORY_TRACT | Status: DC
  Administered 2012-08-21 – 2012-08-24 (×6): 2 via RESPIRATORY_TRACT
  Filled 2012-08-21 (×2): qty 6

## 2012-08-21 MED ORDER — SIMVASTATIN 10 MG PO TABS
10.0000 mg | ORAL_TABLET | Freq: Every day | ORAL | Status: DC
  Administered 2012-08-21 – 2012-08-23 (×3): 10 mg via ORAL
  Filled 2012-08-21 (×4): qty 1

## 2012-08-21 MED ORDER — DONEPEZIL HCL 5 MG PO TABS
5.0000 mg | ORAL_TABLET | Freq: Every day | ORAL | Status: DC
  Administered 2012-08-21 – 2012-08-23 (×3): 5 mg via ORAL
  Filled 2012-08-21 (×4): qty 1

## 2012-08-21 MED ORDER — AMLODIPINE BESYLATE 5 MG PO TABS: 5.0000 mg | ORAL_TABLET | Freq: Every morning | ORAL | Status: DC

## 2012-08-21 MED ORDER — TIOTROPIUM BROMIDE MONOHYDRATE 18 MCG IN CAPS
18.0000 ug | ORAL_CAPSULE | Freq: Every day | RESPIRATORY_TRACT | Status: DC
  Administered 2012-08-22 – 2012-08-24 (×3): 18 ug via RESPIRATORY_TRACT
  Filled 2012-08-21: qty 5

## 2012-08-21 MED ORDER — ALBUTEROL SULFATE (5 MG/ML) 0.5% IN NEBU
2.5000 mg | INHALATION_SOLUTION | Freq: Four times a day (QID) | RESPIRATORY_TRACT | Status: DC
  Administered 2012-08-21 – 2012-08-22 (×3): 2.5 mg via RESPIRATORY_TRACT
  Filled 2012-08-21 (×3): qty 0.5

## 2012-08-21 MED ORDER — IPRATROPIUM BROMIDE 0.06 % NA SOLN
1.0000 | Freq: Three times a day (TID) | NASAL | Status: DC
  Administered 2012-08-21 – 2012-08-23 (×5): 1 via NASAL
  Filled 2012-08-21: qty 15

## 2012-08-21 MED ORDER — ASPIRIN 325 MG PO TABS
325.0000 mg | ORAL_TABLET | Freq: Every morning | ORAL | Status: DC
  Administered 2012-08-21 – 2012-08-24 (×4): 325 mg via ORAL
  Filled 2012-08-21 (×4): qty 1

## 2012-08-21 MED ORDER — SODIUM CHLORIDE 0.9 % IV SOLN
INTRAVENOUS | Status: AC
  Administered 2012-08-21: 14:00:00 via INTRAVENOUS

## 2012-08-21 MED ORDER — ACETAMINOPHEN 650 MG RE SUPP: 650.0000 mg | Freq: Four times a day (QID) | RECTAL | Status: DC | PRN

## 2012-08-21 MED ORDER — ALLOPURINOL 300 MG PO TABS: 300.0000 mg | ORAL_TABLET | Freq: Every morning | ORAL | Status: DC

## 2012-08-21 MED ORDER — MECLIZINE HCL 25 MG PO TABS
25.0000 mg | ORAL_TABLET | Freq: Three times a day (TID) | ORAL | Status: DC | PRN
  Filled 2012-08-21: qty 1

## 2012-08-21 MED ORDER — LEVOFLOXACIN IN D5W 750 MG/150ML IV SOLN: 750.0000 mg | INTRAVENOUS | Status: DC

## 2012-08-21 MED ORDER — DEXTROSE 5 % IV SOLN
1.0000 g | Freq: Once | INTRAVENOUS | Status: AC
  Administered 2012-08-21: 1 g via INTRAVENOUS
  Filled 2012-08-21: qty 10

## 2012-08-21 MED ORDER — TAMSULOSIN HCL 0.4 MG PO CAPS
0.4000 mg | ORAL_CAPSULE | Freq: Every day | ORAL | Status: DC
  Administered 2012-08-21 – 2012-08-24 (×4): 0.4 mg via ORAL
  Filled 2012-08-21 (×4): qty 1

## 2012-08-21 MED ORDER — MEMANTINE HCL 10 MG PO TABS
10.0000 mg | ORAL_TABLET | Freq: Two times a day (BID) | ORAL | Status: DC
  Administered 2012-08-21 – 2012-08-24 (×6): 10 mg via ORAL
  Filled 2012-08-21 (×7): qty 1

## 2012-08-21 MED ORDER — ACETAMINOPHEN 325 MG PO TABS: 650.0000 mg | ORAL_TABLET | Freq: Four times a day (QID) | ORAL | Status: DC | PRN

## 2012-08-21 MED ORDER — HEPARIN SODIUM (PORCINE) 5000 UNIT/ML IJ SOLN
5000.0000 [IU] | Freq: Three times a day (TID) | INTRAMUSCULAR | Status: DC
  Administered 2012-08-21 – 2012-08-24 (×8): 5000 [IU] via SUBCUTANEOUS
  Filled 2012-08-21 (×11): qty 1

## 2012-08-21 NOTE — ED Provider Notes (Addendum)
History     CSN: [id number]  Arrival date & time 08/21/12  1107   First MD Initiated Contact with Patient 08/21/12 1113      Chief Complaint  Patient presents with  . Shortness of Breath    (Consider location/radiation/quality/duration/timing/severity/associated sxs/prior treatment) HPI The patient presents with familial concerns of increasing dyspnea, cough, congestion. He has a history of COPD, as well as dementia.  The patient self denies any complaints, does not understand why he is here.  He is oriented only to self, but is awake and alert, answering simple questions history 4 manner. Per report the patient was at home with recently productive cough, increasing dyspnea onset EMS call.  Per EMS the patient was hypoxic, 75% on room air.  The patient received albuterol, Solu-Medrol in route.  Prior to my evaluation the patient also received an additional dose of an albuterol per protocol. Past Medical History  Diagnosis Date  . Heart disease   . Hypertension   . Hyperlipidemia   . Urine incontinence   . Alcohol problem drinking   . Emphysema of lung   . Type II diabetes mellitus   . Chronic kidney disease     "moderate"  . Arthritis     "all over"  . Gout   . Dementia   . UTI (lower urinary tract infection) 12/08/2011    "first one"    Past Surgical History  Procedure Laterality Date  . Coronary artery bypass graft  1994    Family History  Problem Relation Age of Onset  . Arthritis Mother   . Cancer Father   . Hyperlipidemia Father   . Heart disease Father   . Hypertension Father   . Diabetes Father   . Stroke Sister   . Stroke Brother     History  Substance Use Topics  . Smoking status: Former Smoker -- 0.50 packs/day for 67 years    Types: Cigarettes    Quit date: 05/31/2008  . Smokeless tobacco: Never Used  . Alcohol Use: Yes     Comment: 12/08/11 "last alcohol was ~ 20 yr ago"      Review of Systems  Unable to perform ROS: Dementia     Allergies  Bee venom and Penicillins  Home Medications   Current Outpatient Rx  Name  Route  Sig  Dispense  Refill  . allopurinol (ZYLOPRIM) 300 MG tablet   Oral   Take 300 mg by mouth every morning.          Marland Kitchen amLODipine (NORVASC) 5 MG tablet   Oral   Take 5 mg by mouth every morning.          Marland Kitchen aspirin 325 MG tablet   Oral   Take 325 mg by mouth every morning.          . budesonide-formoterol (SYMBICORT) 160-4.5 MCG/ACT inhaler   Inhalation   Inhale 2 puffs into the lungs 2 (two) times daily.   1 Inhaler   2   . donepezil (ARICEPT) 5 MG tablet   Oral   Take 1 tablet (5 mg total) by mouth at bedtime.   30 tablet   2   . glucose blood test strip      Pt to test 2 times daily.  Dx code:250.42   100 each   3   . ipratropium (ATROVENT) 0.06 % nasal spray   Nasal   Place 1 spray into the nose 3 (three) times daily.   15 mL  6   . meclizine (ANTIVERT) 25 MG tablet   Oral   Take 1 tablet (25 mg total) by mouth 3 (three) times daily as needed. For dizziness   90 tablet   0   . memantine (NAMENDA) 10 MG tablet   Oral   Take 10 mg by mouth 2 (two) times daily.           . metoprolol (LOPRESSOR) 100 MG tablet   Oral   Take 1 tablet (100 mg total) by mouth 2 (two) times daily.   60 tablet   5   . simvastatin (ZOCOR) 10 MG tablet   Oral   Take 1 tablet (10 mg total) by mouth at bedtime.   90 tablet   1   . tamsulosin (FLOMAX) 0.4 MG CAPS   Oral   Take 1 capsule (0.4 mg total) by mouth daily.   30 capsule   5   . thiamine (VITAMIN B-1) 100 MG tablet   Oral   Take 100 mg by mouth daily.         . valsartan-hydrochlorothiazide (DIOVAN-HCT) 320-25 MG per tablet   Oral   Take 1 tablet by mouth daily.   30 tablet   5     BP 165/61  Pulse 65  Temp(Src) 99.2 F (37.3 C) (Oral)  Resp 21  SpO2 95%  Physical Exam  Nursing note and vitals reviewed. Constitutional: He is oriented to person, place, and time. He appears well-developed.  No distress.  HENT:  Head: Normocephalic and atraumatic.  Eyes: Conjunctivae and EOM are normal.  Cardiovascular: Normal rate.  An irregularly irregular rhythm present.  Pulmonary/Chest: Effort normal. No stridor. No respiratory distress. He has decreased breath sounds.  Abdominal: He exhibits no distension.  Musculoskeletal: He exhibits no edema.  Neurological: He is alert and oriented to person, place, and time.  Skin: Skin is warm and dry.  Psychiatric: He has a normal mood and affect. Cognition and memory are impaired. He exhibits abnormal recent memory and abnormal remote memory.    ED Course  Procedures (including critical care time)  Labs Reviewed  BASIC METABOLIC PANEL  CBC  APTT  PROTIME-INR   No results found.   No diagnosis found.   Pulse ox 97% nasal cannula abnormal Cardiac: 70 sinus rhythm normal    Date: 08/21/2012  Rate: 67  Rhythm: normal sinus rhythm  QRS Axis: left  Intervals: PR prolonged  ST/T Wave abnormalities: nonspecific ST/T changes  Conduction Disutrbances:pvc, lvh  Narrative Interpretation:   Old EKG Reviewed: unchanged Abnormal  CXR, interpreted by me - PNA  MDM  This elderly patient with dementia now presents with dyspnea, cough, congestion.  On exam the patient is audibly better, though with his report of hypoxia, cough, congestion, the patient we admitted for further evaluation and management of community-acquired pneumonia.        Gerhard Munch, MD 08/21/12 1247  Gerhard Munch, MD 08/30/12 [phone]

## 2012-08-21 NOTE — Progress Notes (Signed)
MEDICATION RELATED CONSULT NOTE - INITIAL   Pharmacy consulted to renally adjust antibiotics. Levaquin initiated for suspected aspiration PNA.  Patient Measurements: Height: 5' 4.17" (163 cm) Weight: 195 lb 15.8 oz (88.9 kg) IBW/kg (Calculated) : 59.6  Estimated Creatinine Clearance: 40.3 ml/min (by C-G formula based on Cr of 1.35).  Plan:  1. Change Levaquin to 750mg  IV q48h 2. Monitor renal function, clinical status and adjust as indicated  Cleon Dew [phone] 08/21/2012,3:05 PM

## 2012-08-21 NOTE — ED Notes (Signed)
BIB [facility] from home. C/O productive cough x1 week. Today family noting increasing SOB although patient states no complaints. Sputum yellow/brown EMS: [place] all fields, 75% on R/A prior to O2 admin, Afib on monitor, Hx of same. CBG 129, 125mg  solu-medrol, 5 mg albuterol x1, duo-neb x1.

## 2012-08-21 NOTE — H&P (Signed)
[facility] History and Physical  Lucas Obrien RUE:[id number] DOB: 1925/03/13 DOA: 08/21/2012  Referring physician: Dr. Jeraldine Loots PCP: Neena Rhymes, MD  Specialists: none  Chief Complaint: dyspnea  HPI: Lucas Obrien is a 79 y.o. male  Past medical history of dementia, COPD not on home oxygen, history of alcohol abuse, and that comes in for cough and shortness of breath about one week prior to admission. He relates his cough is productive. He relates it usually happens when he is eating or after he has eaten. He is getting short social breath after eating this morning that he called EMS. Per EMS report he was hypoxic to 75% on room air.  In the emergency room he was put on oxygen , albuterol and Solu-Medrol and he improved. Treatment for community-acquired pneumonia and was placed n.p.o. and swallowing evaluation was ordered. Consulted for further evaluation.  Review of Systems: The patient denies anorexia, fever, weight loss,, vision loss, decreased hearing, hoarseness, chest pain, syncope, dyspnea on exertion, peripheral edema, balance deficits, hemoptysis, abdominal pain, melena, hematochezia, severe indigestion/heartburn, hematuria, incontinence, genital sores, muscle weakness, suspicious skin lesions, transient blindness, difficulty walking, depression, unusual weight change, abnormal bleeding, enlarged lymph nodes, angioedema, and breast masses.    Past Medical History  Diagnosis Date  . Heart disease   . Hypertension   . Hyperlipidemia   . Urine incontinence   . Alcohol problem drinking   . Emphysema of lung   . Type II diabetes mellitus   . Chronic kidney disease     "moderate"  . Arthritis     "all over"  . Gout   . Dementia   . UTI (lower urinary tract infection) 12/08/2011    "first one"   Past Surgical History  Procedure Laterality Date  . Coronary artery bypass graft  1994   Social History:  reports that he quit smoking about 4 years ago. His smoking use  included Cigarettes. He has a 33.5 pack-year smoking history. He has never used smokeless tobacco. He reports that  drinks alcohol. He reports that he does not use illicit drugs. Is an skilled nursing facility  Allergies  Allergen Reactions  . Bee Venom     Unknown  . Penicillins     Unknown reaction    Family History  Problem Relation Age of Onset  . Arthritis Mother   . Cancer Father   . Hyperlipidemia Father   . Heart disease Father   . Hypertension Father   . Diabetes Father   . Stroke Sister   . Stroke Brother     Prior to Admission medications   Medication Sig Start Date End Date Taking? Authorizing Provider  allopurinol (ZYLOPRIM) 300 MG tablet Take 300 mg by mouth every morning.     Historical Provider, MD  amLODipine (NORVASC) 5 MG tablet Take 5 mg by mouth every morning.     Historical Provider, MD  aspirin 325 MG tablet Take 325 mg by mouth every morning.     Historical Provider, MD  budesonide-formoterol (SYMBICORT) 160-4.5 MCG/ACT inhaler Inhale 2 puffs into the lungs 2 (two) times daily. 08/16/12   Sheliah Hatch, MD  donepezil (ARICEPT) 5 MG tablet Take 1 tablet (5 mg total) by mouth at bedtime. 07/10/12 07/10/13  Sheliah Hatch, MD  glucose blood test strip Pt to test 2 times daily.  Dx code:250.42 07/20/12   Sheliah Hatch, MD  ipratropium (ATROVENT) 0.06 % nasal spray Place 1 spray into the nose 3 (three) times daily. 04/07/12  Sheliah Hatch, MD  meclizine (ANTIVERT) 25 MG tablet Take 1 tablet (25 mg total) by mouth 3 (three) times daily as needed. For dizziness 07/21/12   Sheliah Hatch, MD  memantine (NAMENDA) 10 MG tablet Take 10 mg by mouth 2 (two) times daily.      Historical Provider, MD  metoprolol (LOPRESSOR) 100 MG tablet Take 1 tablet (100 mg total) by mouth 2 (two) times daily. 07/25/12   Sheliah Hatch, MD  simvastatin (ZOCOR) 10 MG tablet Take 1 tablet (10 mg total) by mouth at bedtime. 03/31/12   Sheliah Hatch, MD  tamsulosin  (FLOMAX) 0.4 MG CAPS Take 1 capsule (0.4 mg total) by mouth daily. 07/25/12   Sheliah Hatch, MD  thiamine (VITAMIN B-1) 100 MG tablet Take 100 mg by mouth daily.    Historical Provider, MD  valsartan-hydrochlorothiazide (DIOVAN-HCT) 320-25 MG per tablet Take 1 tablet by mouth daily. 07/25/12   Sheliah Hatch, MD   Physical Exam: Filed Vitals:   08/21/12 1112 08/21/12 1155 08/21/12 1338 08/21/12 1346  BP: 165/61  172/50   Pulse: 65  64   Temp: 99.2 F (37.3 C)  97.5 F (36.4 C) 97.5 F (36.4 C)  TempSrc: Oral  Oral   Resp: 21  17   SpO2: 98% 95% 95%     BP 172/50  Pulse 64  Temp(Src) 97.5 F (36.4 C) (Oral)  Resp 17  SpO2 95%    BP 172/50  Pulse 64  Temp(Src) 97.5 F (36.4 C) (Oral)  Resp 17  SpO2 95%  General Appearance:    Alert, cooperative, no distress, appears stated age  Head:    Normocephalic, without obvious abnormality, atraumatic  Eyes:    PERRL, conjunctiva/corneas clear, EOM's intact, fundi    benign, both eyes             Throat:   Lips, mucosa, and tongue normal, poor oral hygiene   Neck:   Supple, symmetrical, trachea midline, no adenopathy;       thyroid:  No enlargement/tenderness/nodules; no carotid   bruit or JVD  Back:     Symmetric, no curvature, ROM normal, no CVA tenderness  Lungs:     moderate air movement and some labored breathing with crackles and rhonchi on the right lower, with some mild wheezing bilaterally   Chest wall:    No tenderness or deformity  Heart:    Regular rate and rhythm, S1 and S2 normal, no murmur, rub   or gallop  Abdomen:     Soft, non-tender, bowel sounds active all four quadrants,    no masses, no organomegaly        Extremities:   Extremities normal, atraumatic, no cyanosis or edema  Pulses:   2+ and symmetric all extremities  Skin:   Skin color, texture, turgor normal, no rashes or lesions  Lymph nodes:   Cervical, supraclavicular, and axillary nodes normal  Neurologic:   CNII-XII intact. Normal  strength, sensation and reflexes      throughout    Labs on Admission:  Basic Metabolic Panel:  Recent Labs Lab 08/21/12 1150  NA 138  K 3.7  CL 104  CO2 21  GLUCOSE 146*  BUN 30*  CREATININE 1.35  CALCIUM 9.7   Liver Function Tests: No results found for this basename: AST, ALT, ALKPHOS, BILITOT, PROT, ALBUMIN,  in the last 168 hours No results found for this basename: LIPASE, AMYLASE,  in the last 168 hours No results found  for this basename: AMMONIA,  in the last 168 hours CBC:  Recent Labs Lab 08/21/12 1150  WBC 8.0  HGB 11.6*  HCT 33.8*  MCV 89.7  PLT 127*   Cardiac Enzymes: No results found for this basename: CKTOTAL, CKMB, CKMBINDEX, TROPONINI,  in the last 168 hours  BNP (last 3 results) No results found for this basename: PROBNP,  in the last 8760 hours CBG: No results found for this basename: GLUCAP,  in the last 168 hours  Radiological Exams on Admission: Dg Chest 2 View (if Patient Has Fever And/or Copd)  08/21/2012  *RADIOLOGY REPORT*  Clinical Data: Shortness of breath  CHEST - 2 VIEW  Comparison: 02/13/2012  Findings: The heart and pulmonary vascularity are stable.  The lungs are well-aerated however it a right lower lobe infiltrate is identified.  Mild atelectasis is noted in the left lung base.  No acute bony abnormality is seen.  IMPRESSION: Right basilar infiltrate.  Follow-up films to resolution are recommended.   Original Report Authenticated By: Alcide Clever, M.D.     EKG: Independently reviewed. Sinus rhythm, premature atrial contraction, normal axis, LVH criteria by aVL.  Assessment/Plan  Aspiration pneumonia: - The patient himself relates that he's been coughing for the past week. He relates it usually happens when he is eating or after eating. Which makes me highly suspicious for aspiration pneumonia. However him place him n.p.o., he was empirically started on Rocephin and azithromycin in the emergency room, get a swallowing evaluation by  speech. We'll go ahead and change his antibiotics to IV Levaquin which should cover aspiration pneumonia as well as community acquired pneumonia. We'll get physical therapy to evaluate. We'll get sputum cultures. We'll go these will be negative most likely.  - There is some mild wheezing on physical exam. We'll go head and start him on Solu-Medrol, albuterol and Spiriva. As she does have a history of COPD.  DM (diabetes mellitus), type 2, uncontrolled, with renal complications, diet controlled: - Seems to be well-controlled as he is on no blood pressure medications. We'll check CBGs to the 4 hours as he is going to be n.p.o. We'll start him on D5 half-normal saline for 12 hours.  Hypertension: - Currently well controlled. Continue Norvasc. Also continue metoprolol.  Dementia: Stable continue current meds.   Code Status: full Family Communication: none Disposition Plan: inpatient (indicate anticipated LOS)  Time spent: 65 mintes  FELIZ Rosine Beat [facility] Pager [phone]  If 7PM-7AM, please contact night-coverage [email or web address] Password [facility] 08/21/2012, 2:08 PM

## 2012-08-21 NOTE — Telephone Encounter (Signed)
Patient with pending appointment with Dr.Tabori on 09/25/2012, Dr.Tabori please advise, it appears patient is in the hospital

## 2012-08-21 NOTE — Telephone Encounter (Signed)
Refill- amlodipine besylate 5mg  tab. Take one tablet by mouth once daily for high blood pressure. Qty 30 last fill 2.26.14  Refill- allopurinol 300mg  tab. Take one tablet by mouth once daily to prevent gout. Qty 30 last fill 2.26.14

## 2012-08-22 ENCOUNTER — Inpatient Hospital Stay (HOSPITAL_COMMUNITY): Payer: Medicare Other

## 2012-08-22 DIAGNOSIS — J189 Pneumonia, unspecified organism: Secondary | ICD-10-CM

## 2012-08-22 DIAGNOSIS — E785 Hyperlipidemia, unspecified: Secondary | ICD-10-CM

## 2012-08-22 LAB — LEGIONELLA ANTIGEN, URINE: Legionella Antigen, Urine: NEGATIVE

## 2012-08-22 LAB — COMPREHENSIVE METABOLIC PANEL
ALT: 14 U/L (ref 0–53)
AST: 17 U/L (ref 0–37)
Alkaline Phosphatase: 55 U/L (ref 39–117)
CO2: 22 mEq/L (ref 19–32)
Chloride: 106 mEq/L (ref 96–112)
GFR calc Af Amer: 60 mL/min — ABNORMAL LOW (ref >90)
GFR calc non Af Amer: 52 mL/min — ABNORMAL LOW (ref >90)
Glucose, Bld: 193 mg/dL — ABNORMAL HIGH (ref 70–99)
Sodium: 139 mEq/L (ref 135–145)
Total Bilirubin: 0.4 mg/dL (ref 0.3–1.2)

## 2012-08-22 LAB — CBC
MCH: 30.8 pg (ref 26.0–34.0)
MCHC: 34.3 g/dL (ref 30.0–36.0)
Platelets: 129 10*3/uL — ABNORMAL LOW (ref 150–400)
RDW: 14.7 % (ref 11.5–15.5)

## 2012-08-22 LAB — GLUCOSE, CAPILLARY
Glucose-Capillary: 175 mg/dL — ABNORMAL HIGH (ref 70–99)
Glucose-Capillary: 169 mg/dL — ABNORMAL HIGH (ref 70–99)

## 2012-08-22 LAB — HIV ANTIBODY (ROUTINE TESTING W REFLEX): HIV: NONREACTIVE

## 2012-08-22 MED ORDER — STARCH (THICKENING) PO POWD
ORAL | Status: DC | PRN
  Filled 2012-08-22 (×2): qty 227

## 2012-08-22 MED ORDER — RESOURCE THICKENUP CLEAR PO POWD
ORAL | Status: DC | PRN
  Filled 2012-08-22: qty 125

## 2012-08-22 MED ORDER — PREDNISONE 20 MG PO TABS
40.0000 mg | ORAL_TABLET | Freq: Every day | ORAL | Status: DC
  Administered 2012-08-23: 40 mg via ORAL
  Filled 2012-08-22 (×2): qty 2

## 2012-08-22 MED ORDER — ALBUTEROL SULFATE (5 MG/ML) 0.5% IN NEBU
2.5000 mg | INHALATION_SOLUTION | Freq: Three times a day (TID) | RESPIRATORY_TRACT | Status: DC
  Administered 2012-08-22 – 2012-08-24 (×6): 2.5 mg via RESPIRATORY_TRACT
  Filled 2012-08-22 (×6): qty 0.5

## 2012-08-22 MED ORDER — AMLODIPINE BESYLATE 10 MG PO TABS
10.0000 mg | ORAL_TABLET | Freq: Every morning | ORAL | Status: DC
  Administered 2012-08-22 – 2012-08-24 (×3): 10 mg via ORAL
  Filled 2012-08-22 (×3): qty 1

## 2012-08-22 NOTE — Evaluation (Signed)
Physical Therapy Evaluation Patient Details Name: Lucas Obrien MRN: [id number] DOB: January 27, 1925 Today's Date: 08/22/2012 Time: 2440-1027 PT Time Calculation (min): 28 min  PT Assessment / Plan / Recommendation Clinical Impression  Pt adm for PNA.  Pt with history of dementia and lives with family.  Pt doing fairly well with mobility and should be able to return home with family.  Pt needs to incr activity while he remains in hospital to ensure that pt can return home. Needs skilled PT to work on balance and gait.    PT Assessment  Patient needs continued PT services    Follow Up Recommendations  No PT follow up    Does the patient have the potential to tolerate intense rehabilitation      Barriers to Discharge        Equipment Recommendations  None recommended by PT    Recommendations for Other Services     Frequency Min 3X/week    Precautions / Restrictions Precautions Precautions: Fall   Pertinent Vitals/Pain N/A      Mobility  Bed Mobility Bed Mobility: Supine to Sit;Sitting - Scoot to Edge of Bed;Sit to Supine Supine to Sit: 5: Supervision;HOB elevated Sitting - Scoot to Edge of Bed: 5: Supervision Sit to Supine: 5: Supervision Transfers Transfers: Sit to Stand;Stand to Sit Sit to Stand: 4: Min guard;With upper extremity assist;From bed Stand to Sit: 4: Min guard;With upper extremity assist;To bed Details for Transfer Assistance: Assist for balance. Ambulation/Gait Ambulation/Gait Assistance: 4: Min guard Ambulation Distance (Feet): 200 Feet Assistive device: None Ambulation/Gait Assistance Details: Assist for balance. Gait Pattern: Step-through pattern;Decreased stride length Gait velocity: decr    Exercises General Exercises - Lower Extremity Long Arc Quad: Strengthening;Both;10 reps;Seated Hip Flexion/Marching: Strengthening;Both;10 reps;Seated   PT Diagnosis: Difficulty walking  PT Problem List: Decreased balance;Decreased mobility PT Treatment  Interventions: DME instruction;Gait training;Patient/family education;Functional mobility training;Therapeutic activities;Therapeutic exercise;Balance training   PT Goals Acute Rehab PT Goals PT Goal Formulation: With family Time For Goal Achievement: 08/22/12 Potential to Achieve Goals: Good Pt will go Sit to Stand: with supervision PT Goal: Sit to Stand - Progress: Goal set today Pt will go Stand to Sit: with supervision PT Goal: Stand to Sit - Progress: Goal set today Pt will Ambulate: >150 feet;with supervision PT Goal: Ambulate - Progress: Goal set today  Visit Information  Last PT Received On: 08/22/12 Assistance Needed: +1    Subjective Data  Subjective: "Yes ma'am," pt stated to most questions. Patient Stated Goal: Pt did not state due to dementia.   Prior Functioning  Home Living Lives With: Son;Family Available Help at Discharge: Family;Available 24 hours/day Type of Home: House Home Access: Stairs to enter [employer] of Steps: 4 Entrance Stairs-Rails: Right Home Layout: One level Home Adaptive Equipment: None Prior Function Level of Independence: Independent (for amb) Able to Take Stairs?: Yes Driving: No Vocation: Retired Comments: supervision for cognitive impairment Communication Communication: No difficulties    Copywriter, advertising Overall Cognitive Status: History of cognitive impairments - at baseline Arousal/Alertness: Awake/alert Orientation Level: Disoriented to;Place;Time;Situation Behavior During Session: [facility] for tasks performed    Extremity/Trunk Assessment Right Lower Extremity Assessment RLE ROM/Strength/Tone: [facility] for tasks assessed Left Lower Extremity Assessment LLE ROM/Strength/Tone: [facility] for tasks assessed   Balance Balance Balance Assessed: Yes Static Standing Balance Static Standing - Balance Support: No upper extremity supported Static Standing - Level of Assistance: 4: Min assist  End of Session PT - End of  Session Equipment Utilized During Treatment: Gait belt Activity  Tolerance: Patient tolerated treatment well Patient left: in bed;with call bell/phone within reach;with bed alarm set;with family/visitor present Nurse Communication: Mobility status  GP     [facility] 08/22/2012, 12:05 PM  [facility] PT [phone]

## 2012-08-22 NOTE — Evaluation (Signed)
Occupational Therapy Evaluation Patient Details Name: Ladainian Therien MRN: [id number] DOB: Sep 10, 1924 Today's Date: 08/22/2012 Time: 4098-1191 OT Time Calculation (min): 37 min  OT Assessment / Plan / Recommendation Clinical Impression  This 79 yo male admitted with cough, SOB, CAP presents to acute OT with problems below. WIll benefit from acute OT with follow up at SNF ([facility]OT if not SNF)    OT Assessment  Patient needs continued OT Services    Follow Up Recommendations  SNF ((if not SNf then [facility]))    Barriers to Discharge None    Equipment Recommendations  None recommended by OT       Frequency  Min 2X/week    Precautions / Restrictions Precautions Precautions: Fall   Pertinent Vitals/Pain DOE (1/5) however sats dropped into low 80's just getting up the sink, washing his face, and returning to sit at EOB    ADL  Eating/Feeding: Independent Where Assessed - Eating/Feeding: Edge of bed Grooming: Wash/dry face;Min guard Where Assessed - Grooming: Unsupported standing Upper Body Bathing: Set up;Supervision/safety Where Assessed - Upper Body Bathing: Unsupported sitting Lower Body Bathing: Min guard Where Assessed - Lower Body Bathing: Supported sit to stand Upper Body Dressing: Set up;Supervision/safety Where Assessed - Upper Body Dressing: Unsupported sitting Lower Body Dressing: Min guard Where Assessed - Lower Body Dressing: Unsupported sit to stand Toilet Transfer: Minimal assistance Toilet Transfer Method: Sit to stand Toilet Transfer Equipment: Comfort height toilet Toileting - Clothing Manipulation and Hygiene: Min guard Where Assessed - Engineer, mining and Hygiene: Standing Equipment Used: Rolling walker Transfers/Ambulation Related to ADLs: Min A for all ADL Comments: Bends forward to get to his feet to doff/don socks. Educated pt on purse lipped breathing    OT Diagnosis: Generalized weakness  OT Problem List: Decreased strength;Decreased  activity tolerance;Impaired balance (sitting and/or standing);Cardiopulmonary status limiting activity OT Treatment Interventions: Self-care/ADL training;Therapeutic activities;Energy conservation;DME and/or AE instruction;Patient/family education;Balance training   OT Goals Acute Rehab OT Goals OT Goal Formulation: With patient Time For Goal Achievement: 09/05/12 Potential to Achieve Goals: Good ADL Goals Pt Will Perform Grooming: with set-up;with supervision;Unsupported;Sitting at sink;Standing at sink (2 tasks--taking rest break as he feels he needs one) ADL Goal: Grooming - Progress: Goal set today Pt Will Perform Upper Body Bathing: with set-up;with supervision;Sitting at sink;Standing at sink;Unsupported (taking rest break as he feels he needs one) ADL Goal: Upper Body Bathing - Progress: Goal set today Pt Will Perform Lower Body Bathing: with set-up;with supervision;Standing at sink;Sitting in shower;Unsupported (taking rest break as he feels he needs one) ADL Goal: Lower Body Bathing - Progress: Goal set today Pt Will Perform Upper Body Dressing: with set-up;with supervision;Sit to stand from chair;Sit to stand from bed;Unsupported (taking rest break as he feels he needs one) ADL Goal: Upper Body Dressing - Progress: Goal set today Pt Will Perform Lower Body Dressing: with set-up;with supervision;Sit to stand from chair;Sit to stand from bed;Unsupported (taking rest break as he feels he needs one) ADL Goal: Lower Body Dressing - Progress: Goal set today Pt Will Transfer to Toilet: with supervision;with DME;Comfort height toilet;3-in-1;Grab bars;Regular height toilet ADL Goal: Toilet Transfer - Progress: Goal set today Pt Will Perform Toileting - Clothing Manipulation: with supervision;Standing ADL Goal: Toileting - Clothing Manipulation - Progress: Goal set today Pt Will Perform Toileting - Hygiene: with supervision;Sit to stand from 3-in-1/toilet ADL Goal: Toileting - Hygiene -  Progress: Goal set today Miscellaneous OT Goals Miscellaneous OT Goal #1: Pt will use purse lipped breathing with only questioning cues needed  OT Goal: Miscellaneous Goal #1 - Progress: Goal set today  Visit Information  Last OT Received On: 08/22/12 Assistance Needed: +1    Subjective Data  Subjective: I did all my B/D at home  (daughter in room agreed, only needed A to get in and out of the tub   Prior Functioning     Home Living Lives With: Son;Family Available Help at Discharge: Family;Available 24 hours/day Type of Home: House Home Access: Stairs to enter [employer] of Steps: 4 Entrance Stairs-Rails: Right Home Layout: One level Bathroom Shower/Tub: Forensic scientist: Handicapped height Bathroom Accessibility: Yes How Accessible: Accessible via walker Home Adaptive Equipment: Shower chair with back;Grab bars in shower;Hand-held shower hose Prior Function Level of Independence: Independent Able to Take Stairs?: Yes Driving: Yes Vocation: Retired Comments: supervision due to cognitive impairment Communication Communication: No difficulties Dominant Hand: Right            Cognition  Cognition Overall Cognitive Status: History of cognitive impairments - at baseline Arousal/Alertness: Awake/alert Behavior During Session: [facility] for tasks performed    Extremity/Trunk Assessment Right Upper Extremity Assessment RUE ROM/Strength/Tone: Within functional levels Left Upper Extremity Assessment LUE ROM/Strength/Tone: Within functional levels     Mobility Bed Mobility Bed Mobility: Supine to Sit;Sitting - Scoot to Edge of Bed;Sit to Supine Supine to Sit: 5: Supervision;HOB elevated;With rails (30 degrees) Sitting - Scoot to Edge of Bed: 5: Supervision;With rail Sit to Supine: 4: Min assist;HOB flat;With rail Details for Bed Mobility Assistance: Daughter asked about having his head up at home. I educated her on wedges that can either  go on top of the bed or between the mattress and boxed spring Transfers Transfers: Sit to Stand;Stand to Sit Sit to Stand: 4: Min guard;With upper extremity assist;From bed Stand to Sit: 4: Min guard;With upper extremity assist;To bed Details for Transfer Assistance: VCs for safe hand placement           End of Session OT - End of Session Activity Tolerance:  (limited by drop on sats on RA) Patient left: in bed;with call bell/phone within reach;with bed alarm set;with family/visitor present       Evette Georges [phone] 08/22/2012, 4:53 PM

## 2012-08-22 NOTE — Procedures (Addendum)
Objective Swallowing Evaluation: Modified Barium Swallowing Study  Patient Details  Name: Lucas Obrien MRN: [id number] Date of Birth: [birth year]  Today's Date: 08/22/2012 Time: 1210-1230 SLP Time Calculation (min): 20 min  Past Medical History:  Past Medical History  Diagnosis Date  . Heart disease   . Hypertension   . Hyperlipidemia   . Urine incontinence   . Alcohol problem drinking   . Emphysema of lung   . Type II diabetes mellitus   . Chronic kidney disease     "moderate"  . Arthritis     "all over"  . Gout   . Dementia   . UTI (lower urinary tract infection) 12/08/2011    "first one"  . Shortness of breath   . Pneumonia 08/21/2012   Past Surgical History:  Past Surgical History  Procedure Laterality Date  . Coronary artery bypass graft  1994   HPI:  79 yr old with past medical history of dementia, COPD not on home oxygen, history of alcohol abuse, and that comes in for cough and shortness of breath about one week prior to admission.  He relates it usually happens when he is eating or after he has eaten. He is getting short social breath after eating this morning that he called EMS. Per EMS report he was hypoxic to 75% on room air.  In the emergency room he was put on oxygen , albuterol and Solu-Medrol and he improved. Treatment for community-acquired pneumonia and was placed n.p.o. and swallowing evaluation was ordered.  MBS recommended after BSE to fully assess oropharyngeal phase swallow function.       Assessment / Plan / Recommendation Clinical Impression  Dysphagia Diagnosis: Mild oral phase dysphagia;Moderate pharyngeal phase dysphagia Clinical impression: Pt. exhibited mild oral dysphgia due to mildly decreased oral manipulation and transit to posterior oral cavity with solid texture.  Moderate pharyngeal phase dysphagia contains both sensory and motor impairments resulting in aspiration with thin barium with both silent and audible aspiration /penetration.  Pt.'s  laryngeal elevation and pharyngeal peristalsis is decreased as well as tongue base retraction leading to moderate residuals in pt.'s valleculae and pyriform sinsuses.  In addition the UES is tight and possibly altered/discoordinated due to what appears to be lower cervical osteophyte causing baruim to backflow/ascend into pyriform sinuses.  He has no awareness to pharyngeal residue and required verbal cues to swallow 2 additional times to adequatley reduce residue.   Chin tuck posture was not successful in decreasing residue.  Brief scan of esophagus did not reveal overt abnormalities.  Recommned Dys 3 diet and nectar thick liquids with double swallows and volitional throat clears. Dysphagia appears chronic at baseline, however if overall strength and endurace are improved pt. may be better able to compensate and return to thin liquids.     Treatment Recommendation  Therapy as outlined in treatment plan below    Diet Recommendation Dysphagia 3 (Mechanical Soft);Nectar-thick liquid   Liquid Administration via: Cup;No straw Medication Administration: Whole meds with puree Supervision: Patient able to self feed;Full supervision/cueing for compensatory strategies Compensations: Slow rate;Small sips/bites;Follow solids with liquid;Clear throat intermittently;Multiple dry swallows after each bite/sip Postural Changes and/or Swallow Maneuvers: Seated upright 90 degrees;Upright 30-60 min after meal    Other  Recommendations Oral Care Recommendations: Oral care BID Other Recommendations: Order thickener from pharmacy   Follow Up Recommendations   (to be determined)    Frequency and Duration min 2x/week  2 weeks   Pertinent Vitals/Pain     SLP Swallow Goals Patient  will utilize recommended strategies during swallow to increase swallowing safety with: Moderate cueing Goal #3: Family will verbalilze/demonstrate understanding of swallow precautions with min assist.      Reason for Referral  Objectively evaluate swallowing function   Oral Phase Oral Preparation/Oral Phase Oral Phase: Impaired Oral - Solids Oral - Regular: Delayed oral transit;Weak lingual manipulation (mild)   Pharyngeal Phase Pharyngeal Phase Pharyngeal Phase: Impaired Pharyngeal - Nectar Pharyngeal - Nectar Teaspoon: Pharyngeal residue - pyriform sinuses;Pharyngeal residue - valleculae;Reduced tongue base retraction;Reduced laryngeal elevation;Reduced pharyngeal peristalsis;Reduced anterior laryngeal mobility;Penetration/Aspiration during swallow;Reduced airway/laryngeal closure;Delayed swallow initiation;Premature spillage to valleculae (vallecuale-mod. pyriforms-mod) Penetration/Aspiration details (nectar teaspoon): Material enters airway, remains ABOVE vocal cords then ejected out Pharyngeal - Nectar Cup: Reduced pharyngeal peristalsis;Reduced anterior laryngeal mobility;Reduced laryngeal elevation;Reduced airway/laryngeal closure;Reduced tongue base retraction;Pharyngeal residue - pyriform sinuses;Pharyngeal residue - valleculae;Delayed swallow initiation;Premature spillage to valleculae (vallecuale-mod. pyriforms-mod) Pharyngeal - Thin Pharyngeal - Thin Teaspoon: Penetration/Aspiration during swallow;Reduced pharyngeal peristalsis;Reduced anterior laryngeal mobility;Reduced laryngeal elevation;Reduced airway/laryngeal closure;Reduced tongue base retraction;Pharyngeal residue - pyriform sinuses;Pharyngeal residue - valleculae;Delayed swallow initiation;Premature spillage to valleculae Penetration/Aspiration details (thin teaspoon): Material enters airway, passes BELOW cords and not ejected out despite cough attempt by patient Pharyngeal - Thin Cup: Penetration/Aspiration during swallow;Reduced pharyngeal peristalsis;Reduced anterior laryngeal mobility;Reduced laryngeal elevation;Reduced airway/laryngeal closure;Reduced tongue base retraction;Pharyngeal residue - pyriform sinuses;Pharyngeal residue -  valleculae;Delayed swallow initiation;Premature spillage to valleculae (mild) Penetration/Aspiration details (thin cup): Material enters airway, passes BELOW cords and not ejected out despite cough attempt by patient;Material enters airway, passes BELOW cords without attempt by patient to eject out (silent aspiration) Pharyngeal - Solids Pharyngeal - Puree: Delayed swallow initiation;Premature spillage to valleculae;Pharyngeal residue - valleculae;Pharyngeal residue - pyriform sinuses;Reduced laryngeal elevation;Reduced tongue base retraction (vallecuale-mod. pyriforms-mod) Pharyngeal - Regular: Pharyngeal residue - valleculae;Pharyngeal residue - pyriform sinuses (mild residue)  Cervical Esophageal Phase    GO    Cervical Esophageal Phase Cervical Esophageal Phase: Impaired Cervical Esophageal Phase - Comment Cervical Esophageal Comment: tight UES with backflow into pyriforms with all consistencies         Breck Coons Loribeth Katich M.Ed [employer] [phone]  08/22/2012

## 2012-08-22 NOTE — Evaluation (Signed)
Clinical/Bedside Swallow Evaluation Patient Details  Name: Lucas Obrien MRN: [id number] Date of Birth: 11-20-1924  Today's Date: 08/22/2012 Time: 4098-1191 SLP Time Calculation (min): 12 min  Past Medical History:  Past Medical History  Diagnosis Date  . Heart disease   . Hypertension   . Hyperlipidemia   . Urine incontinence   . Alcohol problem drinking   . Emphysema of lung   . Type II diabetes mellitus   . Chronic kidney disease     "moderate"  . Arthritis     "all over"  . Gout   . Dementia   . UTI (lower urinary tract infection) 12/08/2011    "first one"  . Shortness of breath   . Pneumonia 08/21/2012   Past Surgical History:  Past Surgical History  Procedure Laterality Date  . Coronary artery bypass graft  1994   HPI:  79 yr old with past medical history of dementia, COPD not on home oxygen, history of alcohol abuse, and that comes in for cough and shortness of breath about one week prior to admission.  He relates it usually happens when he is eating or after he has eaten. He is getting short social breath after eating this morning that he called EMS. Per EMS report he was hypoxic to 75% on room air.  In the emergency room he was put on oxygen , albuterol and Solu-Medrol and he improved. Treatment for community-acquired pneumonia and was placed n.p.o. and swallowing evaluation was ordered.  Son affirmed increased coughing during meals.   Assessment / Plan / Recommendation Clinical Impression  Brief bedside swallow assessment revealed no s/s aspiration observed today with thin water via cup.  Pt. had BSE last July which did not reveal abnormalities and objective eval was not needed.  Pt. presents today with pna and son's report of increased coughing during meals, therefore objective assessment with MBS recommended which is scheduled for today at 11:30.    Aspiration Risk  Moderate    Diet Recommendation NPO        Other  Recommendations Recommended Consults:  MBS Oral Care Recommendations: Oral care BID   Follow Up Recommendations   (to be determined)    Frequency and Duration        Pertinent Vitals/Pain none         Swallow Study Prior Functional Status       General HPI: 79 yr old with past medical history of dementia, COPD not on home oxygen, history of alcohol abuse, and that comes in for cough and shortness of breath about one week prior to admission.  He relates it usually happens when he is eating or after he has eaten. He is getting short social breath after eating this morning that he called EMS. Per EMS report he was hypoxic to 75% on room air.  In the emergency room he was put on oxygen , albuterol and Solu-Medrol and he improved. Treatment for community-acquired pneumonia and was placed n.p.o. and swallowing evaluation was ordered.  Son affirmed increased coughing during meals. Type of Study: Bedside swallow evaluation Previous Swallow Assessment: no objective Diet Prior to this Study: NPO Temperature Spikes Noted: No Respiratory Status: Supplemental O2 delivered via (comment) History of Recent Intubation: No Behavior/Cognition: Alert;Cooperative;Pleasant mood;Requires cueing Oral Cavity - Dentition: Adequate natural dentition Self-Feeding Abilities: Able to feed self;Needs set up Patient Positioning: Upright in bed Baseline Vocal Quality: Clear Volitional Cough: Strong Volitional Swallow: Able to elicit    Oral/Motor/Sensory Function Overall Oral Motor/Sensory Function: Appears  within functional limits for tasks assessed   Ice Chips Ice chips: Not tested   Thin Liquid Thin Liquid: Within functional limits Presentation: Cup    Nectar Thick Nectar Thick Liquid: Not tested   Honey Thick Honey Thick Liquid: Not tested   Puree Puree: Not tested   Solid   GO    Solid: Not tested       Royce Macadamia M.Ed [employer] [phone]  08/22/2012

## 2012-08-22 NOTE — Progress Notes (Signed)
Utilization review complete. Chandell Attridge RN CCM Case Mgmt phone [phone] 

## 2012-08-22 NOTE — Progress Notes (Signed)
Speech Language Pathology  Patient Details Name: Lucas Obrien MRN: [id number] DOB: August 12, 1924 Today's Date: 08/22/2012 Time:  -    MBS completed.  Full report to be documented.  SLP recommends Dys 3, nectar thick.  SLP can order diet per MD, however with his chronic kidney disease he may need diet restrictions SLP is unable to order.  Breck Coons Reynolds.Ed [employer] [phone]  08/22/2012

## 2012-08-22 NOTE — Progress Notes (Addendum)
PATIENT DETAILS Name: Lucas Obrien Age: 79 y.o. Sex: male Date of Birth: [birth year] Admit Date: 08/21/2012 Admitting Physician Marinda Elk, MD [id number] Beverely Low, MD  Subjective: Pleasantly confused-calm-son at bedside-admitted with SOB and coughing-family has noticed cough is worse with meals  Assessment/Plan: Active Problems: PNA -CAP vs Aspiration -c/w Empiric Levaquin day #2 -remains afebrile and non toxic looking -appreciate SLP eval-for MBS today  COPD with mild exac -lungs sounds much better that what was documented in the H&P -Change solumedrol to prednisone -c/w bronchodilators -c/w Spiriva  HTN -BP with moderate control -on Metoprolol and Amlodipine at home-will increase Amlodipine to 10 mg  CAD -s/p CABG in the 1980's -stable without pain or SOB -c/w ASA, Metoprolol and Statins  BPH -c/w Flomax  Dementia -at baseline-per son -c/w Aricept and Namenda  Dyslipidemia -c/w Statins  Disposition: Remain inpatient  DVT Prophylaxis: Prophylactic Heparin  Code Status: Full code   Procedures:  None  CONSULTS:  None  PHYSICAL EXAM: Vital signs in last 24 hours: Filed Vitals:   08/21/12 2315 08/22/12 0155 08/22/12 0430 08/22/12 0757  BP:   151/72   Pulse:   57   Temp:   98 F (36.7 C)   TempSrc:   Oral   Resp:   18   Height:      Weight:   89.54 kg (197 lb 6.4 oz)   SpO2: 98% 95% 98% 98%    Weight change:  Body mass index is 33.87 kg/(m^2).   Gen Exam: Awake and pleasantly confused with clear speech.   Neck: Supple, No JVD.   Chest: B/L Clear.   CVS: S1 S2 Regular, no murmurs.  Abdomen: soft, BS +, non tender, non distended.  Extremities: no edema, lower extremities warm to touch. Neurologic: Non Focal.   Skin: No Rash.   Wounds: N/A.    Intake/Output from previous day:  Intake/Output Summary (Last 24 hours) at 08/22/12 0909 Last data filed at 08/22/12 0900  Gross per 24 hour  Intake 1292.08 ml  Output    600 ml   Net 692.08 ml     LAB RESULTS: CBC  Recent Labs Lab 08/21/12 1150 08/21/12 1521 08/22/12 0646  WBC 8.0 7.9 7.9  HGB 11.6* 12.0* 10.4*  HCT 33.8* 35.7* 30.3*  PLT 127* 149* 129*  MCV 89.7 91.8 89.6  MCH 30.8 30.8 30.8  MCHC 34.3 33.6 34.3  RDW 14.7 14.7 14.7    Chemistries   Recent Labs Lab 08/21/12 1150 08/21/12 1521 08/22/12 0646  NA 138  --  139  K 3.7  --  3.6  CL 104  --  106  CO2 21  --  22  GLUCOSE 146*  --  193*  BUN 30*  --  25*  CREATININE 1.35 1.36* 1.23  CALCIUM 9.7  --  9.2    CBG:  Recent Labs Lab 08/21/12 1736 08/22/12 0100 08/22/12 0619 08/22/12 0751  GLUCAP 178* 140* 176* 175*    GFR Estimated Creatinine Clearance: 44.3 ml/min (by C-G formula based on Cr of 1.23).  Coagulation profile  Recent Labs Lab 08/21/12 1150  INR 1.16    Cardiac Enzymes No results found for this basename: CK, CKMB, TROPONINI, MYOGLOBIN,  in the last 168 hours  No components found with this basename: POCBNP,  No results found for this basename: DDIMER,  in the last 72 hours No results found for this basename: HGBA1C,  in the last 72 hours No results found for this basename: CHOL, HDL, LDLCALC, TRIG,  CHOLHDL, LDLDIRECT,  in the last 72 hours No results found for this basename: TSH, T4TOTAL, FREET3, T3FREE, THYROIDAB,  in the last 72 hours No results found for this basename: VITAMINB12, FOLATE, FERRITIN, TIBC, IRON, RETICCTPCT,  in the last 72 hours No results found for this basename: LIPASE, AMYLASE,  in the last 72 hours  Urine Studies No results found for this basename: UACOL, UAPR, USPG, UPH, UTP, UGL, UKET, UBIL, UHGB, UNIT, UROB, ULEU, UEPI, UWBC, URBC, UBAC, CAST, CRYS, UCOM, BILUA,  in the last 72 hours  MICROBIOLOGY: Recent Results (from the past 240 hour(s))  CULTURE, BLOOD (ROUTINE X 2)     Status: None   Collection Time    08/21/12  3:00 PM      Result Value Range Status   Specimen Description BLOOD RIGHT ARM   Final   Special Requests  BOTTLES DRAWN AEROBIC AND ANAEROBIC 10CC   Final   Culture  Setup Time 08/21/2012 20:47   Final   Culture     Final   Value:        BLOOD CULTURE RECEIVED NO GROWTH TO DATE CULTURE WILL BE HELD FOR 5 DAYS BEFORE ISSUING A FINAL NEGATIVE REPORT   Report Status PENDING   Incomplete  CULTURE, BLOOD (ROUTINE X 2)     Status: None   Collection Time    08/21/12  3:15 PM      Result Value Range Status   Specimen Description BLOOD RIGHT HAND   Final   Special Requests BOTTLES DRAWN AEROBIC AND ANAEROBIC 10CC   Final   Culture  Setup Time 08/21/2012 20:47   Final   Culture     Final   Value:        BLOOD CULTURE RECEIVED NO GROWTH TO DATE CULTURE WILL BE HELD FOR 5 DAYS BEFORE ISSUING A FINAL NEGATIVE REPORT   Report Status PENDING   Incomplete    RADIOLOGY STUDIES/RESULTS: Dg Chest 2 View (if Patient Has Fever And/or Copd)  08/21/2012  *RADIOLOGY REPORT*  Clinical Data: Shortness of breath  CHEST - 2 VIEW  Comparison: 02/13/2012  Findings: The heart and pulmonary vascularity are stable.  The lungs are well-aerated however it a right lower lobe infiltrate is identified.  Mild atelectasis is noted in the left lung base.  No acute bony abnormality is seen.  IMPRESSION: Right basilar infiltrate.  Follow-up films to resolution are recommended.   Original Report Authenticated By: Alcide Clever, M.D.     MEDICATIONS: Scheduled Meds: . albuterol  2.5 mg Nebulization Q6H  . allopurinol  300 mg Oral q morning - 10a  . amLODipine  5 mg Oral q morning - 10a  . antiseptic oral rinse  15 mL Mouth Rinse BID  . aspirin  325 mg Oral q morning - 10a  . budesonide-formoterol  2 puff Inhalation BID  . donepezil  5 mg Oral QHS  . heparin  5,000 Units Subcutaneous Q8H  . ipratropium  1 spray Nasal TID  . levofloxacin (LEVAQUIN) IV  750 mg Intravenous Q48H  . memantine  10 mg Oral BID  . methylPREDNISolone (SOLU-MEDROL) injection  60 mg Intravenous Q12H  . metoprolol  100 mg Oral BID  . simvastatin  10 mg Oral  QHS  . tamsulosin  0.4 mg Oral Daily  . thiamine  100 mg Oral Daily  . tiotropium  18 mcg Inhalation Daily   Continuous Infusions: . dextrose 5 % and 0.45% NaCl 75 mL/hr at 08/22/12 0511   PRN Meds:.acetaminophen,  acetaminophen, albuterol, meclizine, ondansetron (ZOFRAN) IV, ondansetron, polyethylene glycol  Antibiotics: Anti-infectives   Start     Dose/Rate Route Frequency Ordered Stop   08/21/12 1600  levofloxacin (LEVAQUIN) IVPB 750 mg     750 mg 100 mL/hr over 90 Minutes Intravenous Every 48 hours 08/21/12 1502 08/27/12 1559   08/21/12 1500  levofloxacin (LEVAQUIN) IVPB 750 mg  Status:  Discontinued     750 mg 100 mL/hr over 90 Minutes Intravenous Every 24 hours 08/21/12 1446 08/21/12 1502   08/21/12 1245  cefTRIAXone (ROCEPHIN) 1 g in dextrose 5 % 50 mL IVPB     1 g 100 mL/hr over 30 Minutes Intravenous  Once 08/21/12 1243 08/21/12 1414   08/21/12 1245  azithromycin (ZITHROMAX) 500 mg in dextrose 5 % 250 mL IVPB  Status:  Discontinued     500 mg 250 mL/hr over 60 Minutes Intravenous  Once 08/21/12 1243 08/21/12 1446       Jeoffrey Massed, MD  [facility] Pager:[phone]  If 7PM-7AM, please contact night-coverage [email or web address] Password [email or web address] 08/22/2012, 9:09 AM   LOS: 1 day

## 2012-08-23 DIAGNOSIS — I2510 Atherosclerotic heart disease of native coronary artery without angina pectoris: Secondary | ICD-10-CM

## 2012-08-23 LAB — GLUCOSE, CAPILLARY: Glucose-Capillary: 174 mg/dL — ABNORMAL HIGH (ref 70–99)

## 2012-08-23 LAB — BASIC METABOLIC PANEL
Chloride: 107 mEq/L (ref 96–112)
Creatinine, Ser: 1.25 mg/dL (ref 0.50–1.35)
GFR calc Af Amer: 59 mL/min — ABNORMAL LOW (ref >90)
GFR calc non Af Amer: 51 mL/min — ABNORMAL LOW (ref >90)
Potassium: 3.3 mEq/L — ABNORMAL LOW (ref 3.5–5.1)

## 2012-08-23 LAB — CBC
MCHC: 33.9 g/dL (ref 30.0–36.0)
Platelets: 131 10*3/uL — ABNORMAL LOW (ref 150–400)
RDW: 14.6 % (ref 11.5–15.5)
WBC: 9.2 10*3/uL (ref 4.0–10.5)

## 2012-08-23 MED ORDER — PREDNISONE 20 MG PO TABS
30.0000 mg | ORAL_TABLET | Freq: Every day | ORAL | Status: DC
  Administered 2012-08-24: 30 mg via ORAL
  Filled 2012-08-23 (×2): qty 1

## 2012-08-23 MED ORDER — IRBESARTAN 300 MG PO TABS
300.0000 mg | ORAL_TABLET | Freq: Every day | ORAL | Status: DC
  Administered 2012-08-23 – 2012-08-24 (×2): 300 mg via ORAL
  Filled 2012-08-23 (×2): qty 1

## 2012-08-23 MED ORDER — POTASSIUM CHLORIDE CRYS ER 20 MEQ PO TBCR
40.0000 meq | EXTENDED_RELEASE_TABLET | Freq: Once | ORAL | Status: AC
  Administered 2012-08-23: 40 meq via ORAL
  Filled 2012-08-23: qty 1

## 2012-08-23 NOTE — Progress Notes (Signed)
Speech Language Pathology Dysphagia Treatment Patient Details Name: Lucas Obrien MRN: [id number] DOB: 06/15/1924 Today's Date: 08/23/2012 Time: 7829-5621 SLP Time Calculation (min): 20 min  Assessment / Plan / Recommendation Clinical Impression  SLP followed up after yesterday's MBS and reviewed MBS results and recommended compensatory strategies with pt. and son.  SLP demonstrated thickening liquids to nectar consistency (and education on where to purchase etc.) to son. Pt. required max verbal and visual cues for 2 additional (3 total swallows) following bites/sips.  Son verbalized as well as demonstrated proper cueing for his father (lives with dad).  Recommend Home Health ST to follow pt. for further education and possible advancement of liquids with repeat MBS in future.     Diet Recommendation  Continue with Current Diet: Dysphagia 3 (mechanical soft);Nectar-thick liquid    SLP Plan Continue with current plan of care   Pertinent Vitals/Pain none   Swallowing Goals  SLP Swallowing Goals Patient will utilize recommended strategies during swallow to increase swallowing safety with: Moderate cueing Swallow Study Goal #2 - Progress: Progressing toward goal Goal #3: Family will verbalilze/demonstrate understanding of swallow precautions with min assist. Swallow Study Goal #3 - Progress: Progressing toward goal  General Temperature Spikes Noted: No Respiratory Status: Supplemental O2 delivered via (comment) Behavior/Cognition: Alert;Cooperative;Pleasant mood;Requires cueing Oral Cavity - Dentition: Adequate natural dentition Patient Positioning: Upright in bed  Oral Cavity - Oral Hygiene Does patient have any of the following "at risk" factors?: Oxygen therapy - cannula, mask, simple oxygen devices Brush patient's teeth BID with toothbrush (using toothpaste with fluoride): Yes Patient is AT RISK - Oral Care Protocol followed (see row info): Yes   Dysphagia Treatment Treatment  focused on: Skilled observation of diet tolerance;Patient/family/caregiver education;Utilization of compensatory strategies Family/Caregiver Educated: son Treatment Methods/Modalities: Skilled observation Patient observed directly with PO's: Yes Type of PO's observed: Nectar-thick liquids Feeding: Able to feed self Liquids provided via: Cup;No straw Pharyngeal Phase Signs & Symptoms: Delayed throat clear Type of cueing: Verbal;Visual Amount of cueing: Maximal   GO     Breck Coons [employer] [phone]  08/23/2012

## 2012-08-23 NOTE — Progress Notes (Signed)
PATIENT DETAILS Name: Lucas Obrien Age: 79 y.o. Sex: male Date of Birth: January 06, 1925 Admit Date: 08/21/2012 Admitting Physician Marinda Elk, MD [id number] Beverely Low, MD  Subjective: No major issues overngiht  Assessment/Plan: Active Problems: PNA -CAP vs Aspiration -c/w Empiric Levaquin day #3 -remains afebrile and non toxic looking -appreciate SLP eval-now on Dys 3 diet-son at bedside-accepts risk of aspiration  COPD with mild exac -lungs sounds much better that what was documented in the H&P -decrease prednisone to 30 mg -c/w bronchodilators -c/w Spiriva  HTN -BP uncontrolled -on Metoprolol and Amlodipine at home- increased  Amlodipine to 10 mg on 3/25, resume ACEI today -stop IVF  CAD -s/p CABG in the 1980's -stable without pain or SOB -c/w ASA, Metoprolol and Statins  BPH -c/w Flomax  Dementia -at baseline-per son -c/w Aricept and Namenda  Dyslipidemia -c/w Statins  Disposition: Remain inpatient-seen by OT-needs SNF-family agreeable-social worker aware-can go to SNF 3/2  DVT Prophylaxis: Prophylactic Heparin  Code Status: Full code   Procedures:  None  CONSULTS:  None  PHYSICAL EXAM: Vital signs in last 24 hours: Filed Vitals:   08/22/12 2214 08/23/12 0251 08/23/12 0613 08/23/12 0726  BP: 173/86 160/69 174/66   Pulse: 64  50   Temp:   97.8 F (36.6 C)   TempSrc:   Oral   Resp:   22   Height:      Weight:   88 kg (194 lb 0.1 oz)   SpO2:   96% 98%    Weight change: -0.9 kg (-1 lb 15.7 oz) Body mass index is 33.28 kg/(m^2).   Gen Exam: Awake and pleasantly confused with clear speech.   Neck: Supple, No JVD.   Chest: B/L Clear.  No rales or rhonchi CVS: S1 S2 Regular, no murmurs.  Abdomen: soft, BS +, non tender, non distended.  Extremities: no edema, lower extremities warm to touch. Neurologic: Non Focal.   Skin: No Rash.   Wounds: N/A.    Intake/Output from previous day:  Intake/Output Summary (Last 24 hours) at  08/23/12 1016 Last data filed at 08/23/12 0600  Gross per 24 hour  Intake   2325 ml  Output   1000 ml  Net   1325 ml     LAB RESULTS: CBC  Recent Labs Lab 08/21/12 1150 08/21/12 1521 08/22/12 0646 08/23/12 0500  WBC 8.0 7.9 7.9 9.2  HGB 11.6* 12.0* 10.4* 10.5*  HCT 33.8* 35.7* 30.3* 31.0*  PLT 127* 149* 129* 131*  MCV 89.7 91.8 89.6 91.7  MCH 30.8 30.8 30.8 31.1  MCHC 34.3 33.6 34.3 33.9  RDW 14.7 14.7 14.7 14.6    Chemistries   Recent Labs Lab 08/21/12 1150 08/21/12 1521 08/22/12 0646 08/23/12 0500  NA 138  --  139 139  K 3.7  --  3.6 3.3*  CL 104  --  106 107  CO2 21  --  22 24  GLUCOSE 146*  --  193* 170*  BUN 30*  --  25* 29*  CREATININE 1.35 1.36* 1.23 1.25  CALCIUM 9.7  --  9.2 9.0    CBG:  Recent Labs Lab 08/22/12 0619 08/22/12 0751 08/22/12 1707 08/23/12 0043 08/23/12 0630  GLUCAP 176* 175* 169* 174* 166*    GFR Estimated Creatinine Clearance: 43.2 ml/min (by C-G formula based on Cr of 1.25).  Coagulation profile  Recent Labs Lab 08/21/12 1150  INR 1.16    Cardiac Enzymes No results found for this basename: CK, CKMB, TROPONINI, MYOGLOBIN,  in the last  168 hours  No components found with this basename: POCBNP,  No results found for this basename: DDIMER,  in the last 72 hours No results found for this basename: HGBA1C,  in the last 72 hours No results found for this basename: CHOL, HDL, LDLCALC, TRIG, CHOLHDL, LDLDIRECT,  in the last 72 hours No results found for this basename: TSH, T4TOTAL, FREET3, T3FREE, THYROIDAB,  in the last 72 hours No results found for this basename: VITAMINB12, FOLATE, FERRITIN, TIBC, IRON, RETICCTPCT,  in the last 72 hours No results found for this basename: LIPASE, AMYLASE,  in the last 72 hours  Urine Studies No results found for this basename: UACOL, UAPR, USPG, UPH, UTP, UGL, UKET, UBIL, UHGB, UNIT, UROB, ULEU, UEPI, UWBC, URBC, UBAC, CAST, CRYS, UCOM, BILUA,  in the last 72  hours  MICROBIOLOGY: Recent Results (from the past 240 hour(s))  CULTURE, BLOOD (ROUTINE X 2)     Status: None   Collection Time    08/21/12  3:00 PM      Result Value Range Status   Specimen Description BLOOD RIGHT ARM   Final   Special Requests BOTTLES DRAWN AEROBIC AND ANAEROBIC 10CC   Final   Culture  Setup Time 08/21/2012 20:47   Final   Culture     Final   Value:        BLOOD CULTURE RECEIVED NO GROWTH TO DATE CULTURE WILL BE HELD FOR 5 DAYS BEFORE ISSUING A FINAL NEGATIVE REPORT   Report Status PENDING   Incomplete  CULTURE, BLOOD (ROUTINE X 2)     Status: None   Collection Time    08/21/12  3:15 PM      Result Value Range Status   Specimen Description BLOOD RIGHT HAND   Final   Special Requests BOTTLES DRAWN AEROBIC AND ANAEROBIC 10CC   Final   Culture  Setup Time 08/21/2012 20:47   Final   Culture     Final   Value:        BLOOD CULTURE RECEIVED NO GROWTH TO DATE CULTURE WILL BE HELD FOR 5 DAYS BEFORE ISSUING A FINAL NEGATIVE REPORT   Report Status PENDING   Incomplete    RADIOLOGY STUDIES/RESULTS: Dg Chest 2 View (if Patient Has Fever And/or Copd)  08/21/2012  *RADIOLOGY REPORT*  Clinical Data: Shortness of breath  CHEST - 2 VIEW  Comparison: 02/13/2012  Findings: The heart and pulmonary vascularity are stable.  The lungs are well-aerated however it a right lower lobe infiltrate is identified.  Mild atelectasis is noted in the left lung base.  No acute bony abnormality is seen.  IMPRESSION: Right basilar infiltrate.  Follow-up films to resolution are recommended.   Original Report Authenticated By: Alcide Clever, M.D.     MEDICATIONS: Scheduled Meds: . albuterol  2.5 mg Nebulization TID  . allopurinol  300 mg Oral q morning - 10a  . amLODipine  10 mg Oral q morning - 10a  . antiseptic oral rinse  15 mL Mouth Rinse BID  . aspirin  325 mg Oral q morning - 10a  . budesonide-formoterol  2 puff Inhalation BID  . donepezil  5 mg Oral QHS  . heparin  5,000 Units Subcutaneous  Q8H  . ipratropium  1 spray Nasal TID  . levofloxacin (LEVAQUIN) IV  750 mg Intravenous Q48H  . memantine  10 mg Oral BID  . metoprolol  100 mg Oral BID  . predniSONE  40 mg Oral Q breakfast  . simvastatin  10 mg  Oral QHS  . tamsulosin  0.4 mg Oral Daily  . thiamine  100 mg Oral Daily  . tiotropium  18 mcg Inhalation Daily   Continuous Infusions: . dextrose 5 % and 0.45% NaCl 75 mL/hr at 08/23/12 0000   PRN Meds:.acetaminophen, acetaminophen, albuterol, meclizine, ondansetron (ZOFRAN) IV, ondansetron, polyethylene glycol, RESOURCE THICKENUP CLEAR, RESOURCE THICKENUP CLEAR  Antibiotics: Anti-infectives   Start     Dose/Rate Route Frequency Ordered Stop   08/21/12 1600  levofloxacin (LEVAQUIN) IVPB 750 mg     750 mg 100 mL/hr over 90 Minutes Intravenous Every 48 hours 08/21/12 1502 08/27/12 1559   08/21/12 1500  levofloxacin (LEVAQUIN) IVPB 750 mg  Status:  Discontinued     750 mg 100 mL/hr over 90 Minutes Intravenous Every 24 hours 08/21/12 1446 08/21/12 1502   08/21/12 1245  cefTRIAXone (ROCEPHIN) 1 g in dextrose 5 % 50 mL IVPB     1 g 100 mL/hr over 30 Minutes Intravenous  Once 08/21/12 1243 08/21/12 1414   08/21/12 1245  azithromycin (ZITHROMAX) 500 mg in dextrose 5 % 250 mL IVPB  Status:  Discontinued     500 mg 250 mL/hr over 60 Minutes Intravenous  Once 08/21/12 1243 08/21/12 1446       Jeoffrey Massed, MD  [facility] Pager:[phone]  If 7PM-7AM, please contact night-coverage [email or web address] Password [email or web address] 08/23/2012, 10:16 AM   LOS: 2 days

## 2012-08-23 NOTE — Progress Notes (Signed)
Referred to this CSW today for ?SNF. Chart reviewed and have spoken with son, [name], at bedside along with patient who is standing up at sink shaving. Plans are for d/c to home with 25 hour care at home via son and daughter- they would like HH services (RN, etc.) to assist with comfort level of daughter primarily of managing diabetes, diet, oxygen if required at home, etc. RNCM advised-  CSW to sign off- please contact us if SW needs arise. Reece Levy, MSW, [facility] [phone]

## 2012-08-23 NOTE — Progress Notes (Signed)
Physical Therapy Treatment Patient Details Name: Lucas Obrien MRN: [id number] DOB: 07/22/1924 Today's Date: 08/23/2012 Time: 8657-8469 PT Time Calculation (min): 38 min  PT Assessment / Plan / Recommendation Comments on Treatment Session  Pt is moving well with RW , but continues to have hypoxia with acitivity despite supplemental O2.  He will benefit from continued PT at d/c and needs 24 assist/supervision    Follow Up Recommendations  Home health PT;SNF;Supervision/Assistance - 24 hour     Does the patient have the potential to tolerate intense rehabilitation     Barriers to Discharge        Equipment Recommendations  Rolling walker with 5" wheels    Recommendations for Other Services    Frequency Min 3X/week   Plan Discharge plan needs to be updated;Frequency remains appropriate    Precautions / Restrictions Precautions Precautions: Fall   Pertinent Vitals/Pain No c/o pain  Pt with decreased O2 sats to 85% with ambulation even with 3L supplemental O2    Mobility  Bed Mobility Bed Mobility: Supine to Sit;Sitting - Scoot to Edge of Bed;Sit to Supine Supine to Sit: 5: Supervision;HOB elevated;With rails (30 degrees) Sitting - Scoot to Edge of Bed: 5: Supervision;With rail Sit to Supine: 4: Min assist Transfers Transfers: Sit to Stand;Stand to Sit Sit to Stand: 5: Supervision Stand to Sit: 5: Supervision Details for Transfer Assistance: VCs for safe hand placement Ambulation/Gait Ambulation/Gait Assistance: 5: Supervision Ambulation Distance (Feet): 300 Feet (200, sittng rest break, 100) Assistive device: Rolling walker Ambulation/Gait Assistance Details: pt did well with RW, no loss of balance, good posture, speed and step length Gait Pattern: Within Functional Limits General Gait Details: pt with decreased O2 sats with amublation, no signs of dyspea.  Pt to 83% when walking 200 feet with 2L O2, decreased to 85% when walking 100 feet with 3L O2 Stairs: No Wheelchair  Mobility Wheelchair Mobility: No    Exercises General Exercises - Lower Extremity Ankle Circles/Pumps: AROM;Both;5 reps;Supine Gluteal Sets: AROM;Both;5 reps;Standing Hip ABduction/ADduction: AAROM;Both;Supine;5 reps Straight Leg Raises: AROM;Both;5 reps;Supine Hip Flexion/Marching: Both;5 reps;Supine;AAROM Other Exercises Other Exercises: deep breathing Other Exercises: sitting trunk extension   PT Diagnosis:    PT Problem List:   PT Treatment Interventions:     PT Goals Acute Rehab PT Goals PT Goal Formulation: With family Time For Goal Achievement: 08/22/12 Potential to Achieve Goals: Good Pt will go Sit to Stand: with supervision PT Goal: Sit to Stand - Progress: Progressing toward goal Pt will go Stand to Sit: with supervision PT Goal: Stand to Sit - Progress: Progressing toward goal Pt will Ambulate: >150 feet;with supervision PT Goal: Ambulate - Progress: Progressing toward goal  Visit Information  Last PT Received On: 08/23/12    Subjective Data  Subjective: "I'm almost [age] years old!"  Pt very pleasant Patient Stated Goal: pt agreeable   Cognition  Cognition Overall Cognitive Status: History of cognitive impairments - at baseline Arousal/Alertness: Awake/alert Orientation Level: Disoriented to;Place;Time;Situation Behavior During Session: WFL for tasks performed    Balance  Static Sitting Balance Static Sitting - Balance Support: No upper extremity supported Static Sitting - Level of Assistance: 7: Independent Static Standing Balance Static Standing - Balance Support: Bilateral upper extremity supported Static Standing - Level of Assistance: 6: Modified independent (Device/Increase time)  End of Session PT - End of Session Equipment Utilized During Treatment: Oxygen Activity Tolerance: Other (comment);Patient tolerated treatment well (decreased O2 sats with amublation) Patient left: in chair;with family/visitor present;with call bell/phone within  reach  GP     Donnetta Hail 08/23/2012, 10:27 AM

## 2012-08-24 MED ORDER — LEVOFLOXACIN 750 MG PO TABS: 750.0000 mg | ORAL_TABLET | ORAL | Status: DC

## 2012-08-24 MED ORDER — LEVOFLOXACIN 750 MG PO TABS: 750.0000 mg | ORAL_TABLET | Freq: Every day | ORAL | Status: DC

## 2012-08-24 MED ORDER — TIOTROPIUM BROMIDE MONOHYDRATE 18 MCG IN CAPS: 18.0000 ug | ORAL_CAPSULE | Freq: Every day | RESPIRATORY_TRACT | Status: DC

## 2012-08-24 NOTE — Progress Notes (Addendum)
Lucas Obrien to be D/C'd Home per MD order.  Discussed with the patient and all questions fully answered.    Medication List    TAKE these medications       allopurinol 300 MG tablet  Commonly known as:  ZYLOPRIM  Take 1 tablet (300 mg total) by mouth every morning.     amLODipine 5 MG tablet  Commonly known as:  NORVASC  Take 1 tablet (5 mg total) by mouth every morning.     aspirin 325 MG tablet  Take 325 mg by mouth every morning.     budesonide-formoterol 160-4.5 MCG/ACT inhaler  Commonly known as:  SYMBICORT  Inhale 2 puffs into the lungs 2 (two) times daily.     cetirizine 10 MG tablet  Commonly known as:  ZYRTEC  Take 10 mg by mouth daily.     donepezil 5 MG tablet  Commonly known as:  ARICEPT  Take 1 tablet (5 mg total) by mouth at bedtime.     guaiFENesin 600 MG 12 hr tablet  Commonly known as:  MUCINEX  Take 600 mg by mouth daily.     ipratropium 0.06 % nasal spray  Commonly known as:  ATROVENT  Place 1 spray into the nose 3 (three) times daily.     levofloxacin 750 MG tablet  Commonly known as:  LEVAQUIN  Take 1 tablet (750 mg total) by mouth every other day.  Start taking on:  08/25/2012     meclizine 25 MG tablet  Commonly known as:  ANTIVERT  Take 25 mg by mouth daily as needed for dizziness.     memantine 10 MG tablet  Commonly known as:  NAMENDA  Take 10 mg by mouth 2 (two) times daily.     metoprolol 100 MG tablet  Commonly known as:  LOPRESSOR  Take 1 tablet (100 mg total) by mouth 2 (two) times daily.     simvastatin 10 MG tablet  Commonly known as:  ZOCOR  Take 1 tablet (10 mg total) by mouth at bedtime.     tamsulosin 0.4 MG Caps  Commonly known as:  FLOMAX  Take 1 capsule (0.4 mg total) by mouth daily.     thiamine 100 MG tablet  Commonly known as:  VITAMIN B-1  Take 100 mg by mouth daily.     tiotropium 18 MCG inhalation capsule  Commonly known as:  SPIRIVA  Place 1 capsule (18 mcg total) into inhaler and inhale daily.     valsartan-hydrochlorothiazide 320-25 MG per tablet  Commonly known as:  DIOVAN-HCT  Take 1 tablet by mouth daily.        VVS, Skin clean, dry and intact without evidence of skin break down, no evidence of skin tears noted. IV catheter discontinued intact. Site without signs and symptoms of complications. Dressing and pressure applied.  An After Visit Summary was printed and given to the patient. Follow up appointments , new prescriptions and medication administration times given to pt and son. Education on pneumonia given. Pt to have HH and oxygen. Went home on oxygen. Patient escorted via WC, and D/C home via private auto.  Cindra Eves, RN 08/24/2012 12:46 PM

## 2012-08-24 NOTE — Progress Notes (Signed)
Decreased fio2 to RA due to stable sats

## 2012-08-24 NOTE — Care Management Note (Signed)
    Page 1 of 2   08/24/2012     10:23:50 AM   CARE MANAGEMENT NOTE 08/24/2012  Patient:  Lucas Obrien, Lucas Obrien   Account Number:  [id number]  Date Initiated:  08/22/2012  Documentation initiated by:  [facility]  Subjective/Objective Assessment:   Aspiration PNA vs Community Aquired PNA     Action/Plan:   pt eval- rec hhpt/ot   Anticipated DC Date:  08/24/2012   Anticipated DC Plan:  HOME W HOME HEALTH SERVICES      DC Planning Services  CM consult      [facility] Choice  HOME HEALTH   Choice offered to / List presented to:  C-4 Adult Children        HH arranged  HH-1 RN  HH-2 PT  HH-3 OT  HH-4 NURSE'S AIDE  HH-6 SOCIAL WORKER      HH agency  [facility]   Status of service:  Completed, signed off Medicare Important Message given?   (If response is "NO", the following Medicare IM given date fields will be blank) Date Medicare IM given:   Date Additional Medicare IM given:    Discharge Disposition:  HOME W HOME HEALTH SERVICES  Per UR Regulation:  Reviewed for med. necessity/level of care/duration of stay  If discussed at Long Length of Stay Meetings, dates discussed:    Comments:  08/24/12 10:21 Letha Cape RN, BSN [phone] patient is for dc today, home with hh services. spoke with son Ricky,he chose [facility] from agency list, referral made to [facility], Lupita Leash notified.  Soc will begin 24-48 hrs post discharge for HHRN,PT,OT,aide and CSW.  Patient has medication coverage and transportation.  Physical therapy is checking patient's ambulation sats now and will let me know if he will need oxygen.

## 2012-08-24 NOTE — Discharge Summary (Signed)
Physician Discharge Summary  Lucas Obrien WRU:[id number] DOB: Dec 06, 1924 DOA: 08/21/2012  PCP: Neena Rhymes, MD  Admit date: 08/21/2012 Discharge date: 08/24/2012  Time spent: 30  minutes  Recommendations for Outpatient Follow-up:  1. Follow up with PCP   Discharge Diagnoses:  Active Problems:   Aspiration pneumonia   DM (diabetes mellitus), type 2, uncontrolled, with renal complications, diet controlled   Dementia   Discharge Condition: stable  Diet recommendation: dysphagia 3   Filed Weights   08/22/12 0430 08/23/12 0613 08/24/12 0454  Weight: 89.54 kg (197 lb 6.4 oz) 88 kg (194 lb 0.1 oz) 89.3 kg (196 lb 13.9 oz)    History of present illness:  79 y.o. male  Past medical history of dementia, COPD not on home oxygen, history of alcohol abuse, and that comes in for cough and shortness of breath about one week prior to admission. He relates his cough is productive. He relates it usually happens when he is eating or after he has eaten. He is getting short social breath after eating this morning that he called EMS. Per EMS report he was hypoxic to 75% on room air.  In the emergency room he was put on oxygen , albuterol and Solu-Medrol and he improved. Treatment for community-acquired pneumonia and was placed n.p.o. and swallowing evaluation was ordered.   Hospital Course:  PNA : - admitted and started on levaquin. -CAP vs Aspiration  -remains afebrile and non toxic looking  -appreciate SLP eval-now on Dys 3 diet-son at bedside-accepts risk of aspiration   COPD with mild exac  - stated on steroids, Inh and antibiotics. - significant improvement -c/w bronchodilators  -c/w Spiriva and spiriva.  HTN  -BP uncontrolled  -on Metoprolol and Amlodipine at home- increased Amlodipine to 10 mg on 3/25, resume ACEI today   CAD  -s/p CABG in the 1980's  -stable without pain or SOB  -c/w ASA, Metoprolol and Statins   BPH  -c/w Flomax   Dementia  -at baseline-per son   -c/w Aricept and Namenda   Dyslipidemia  -c/w Statins   Procedures:  none3 (i.e. Studies not automatically included, echos, thoracentesis, etc; not x-rays)  Consultations:  none  Discharge Exam: Filed Vitals:   08/23/12 1323 08/23/12 2020 08/23/12 2107 08/24/12 0454  BP: 149/73  171/70 165/71  Pulse: 54 57 53 53  Temp: 97.9 F (36.6 C)  97.5 F (36.4 C) 97.8 F (36.6 C)  TempSrc: Oral   Oral  Resp: 18 18 19 20   Height:      Weight:    89.3 kg (196 lb 13.9 oz)  SpO2: 100% 97% 98% 98%    General: A&O 2 Cardiovascular: RRR Respiratory: good air movement, mild wheezing b/l  Discharge Instructions  Discharge Orders   Future Appointments Provider Department Dept Phone   09/25/2012 11:00 AM Sheliah Hatch, MD [facility]  [place] [phone]   Future Orders Complete By Expires     Diet - low sodium heart healthy  As directed     Increase activity slowly  As directed         Medication List    TAKE these medications       allopurinol 300 MG tablet  Commonly known as:  ZYLOPRIM  Take 1 tablet (300 mg total) by mouth every morning.     amLODipine 5 MG tablet  Commonly known as:  NORVASC  Take 1 tablet (5 mg total) by mouth every morning.     aspirin 325 MG tablet  Take 325 mg by mouth every morning.     budesonide-formoterol 160-4.5 MCG/ACT inhaler  Commonly known as:  SYMBICORT  Inhale 2 puffs into the lungs 2 (two) times daily.     cetirizine 10 MG tablet  Commonly known as:  ZYRTEC  Take 10 mg by mouth daily.     donepezil 5 MG tablet  Commonly known as:  ARICEPT  Take 1 tablet (5 mg total) by mouth at bedtime.     guaiFENesin 600 MG 12 hr tablet  Commonly known as:  MUCINEX  Take 600 mg by mouth daily.     ipratropium 0.06 % nasal spray  Commonly known as:  ATROVENT  Place 1 spray into the nose 3 (three) times daily.     levofloxacin 750 MG tablet  Commonly known as:  LEVAQUIN  Take 1 tablet (750 mg total) by mouth  daily.     meclizine 25 MG tablet  Commonly known as:  ANTIVERT  Take 25 mg by mouth daily as needed for dizziness.     memantine 10 MG tablet  Commonly known as:  NAMENDA  Take 10 mg by mouth 2 (two) times daily.     metoprolol 100 MG tablet  Commonly known as:  LOPRESSOR  Take 1 tablet (100 mg total) by mouth 2 (two) times daily.     simvastatin 10 MG tablet  Commonly known as:  ZOCOR  Take 1 tablet (10 mg total) by mouth at bedtime.     tamsulosin 0.4 MG Caps  Commonly known as:  FLOMAX  Take 1 capsule (0.4 mg total) by mouth daily.     thiamine 100 MG tablet  Commonly known as:  VITAMIN B-1  Take 100 mg by mouth daily.     tiotropium 18 MCG inhalation capsule  Commonly known as:  SPIRIVA  Place 1 capsule (18 mcg total) into inhaler and inhale daily.     valsartan-hydrochlorothiazide 320-25 MG per tablet  Commonly known as:  DIOVAN-HCT  Take 1 tablet by mouth daily.           Follow-up Information   Follow up with Neena Rhymes, MD In 2 weeks. (hospital follow up)    Contact information:   [address] [place] [address] [phone]        The results of significant diagnostics from this hospitalization (including imaging, microbiology, ancillary and laboratory) are listed below for reference.    Significant Diagnostic Studies: Dg Chest 2 View (if Patient Has Fever And/or Copd)  08/21/2012  *RADIOLOGY REPORT*  Clinical Data: Shortness of breath  CHEST - 2 VIEW  Comparison: 02/13/2012  Findings: The heart and pulmonary vascularity are stable.  The lungs are well-aerated however it a right lower lobe infiltrate is identified.  Mild atelectasis is noted in the left lung base.  No acute bony abnormality is seen.  IMPRESSION: Right basilar infiltrate.  Follow-up films to resolution are recommended.   Original Report Authenticated By: Alcide Clever, M.D.    Dg Swallowing Func-speech Pathology  08/23/2012  Breck Coons [place], CCC-SLP     08/23/2012  9:01  AM Objective Swallowing Evaluation: Modified Barium Swallowing Study   Patient Details  Name: Lucas Obrien MRN: [id number] Date of Birth: 18-Sep-1924  Today's Date: 08/22/2012 Time: 1210-1230 SLP Time Calculation (min): 20 min  Past Medical History:  Past Medical History  Diagnosis Date  . Heart disease   . Hypertension   . Hyperlipidemia   . Urine incontinence   . Alcohol problem  drinking   . Emphysema of lung   . Type II diabetes mellitus   . Chronic kidney disease     "moderate"  . Arthritis     "all over"  . Gout   . Dementia   . UTI (lower urinary tract infection) 12/08/2011    "first one"  . Shortness of breath   . Pneumonia 08/21/2012   Past Surgical History:  Past Surgical History  Procedure Laterality Date  . Coronary artery bypass graft  1994   HPI:  79 yr old with past medical history of dementia, COPD not on home  oxygen, history of alcohol abuse, and that comes in for cough and  shortness of breath about one week prior to admission.  He  relates it usually happens when he is eating or after he has  eaten. He is getting short social breath after eating this  morning that he called EMS. Per EMS report he was hypoxic to 75%  on room air.  In the emergency room he was put on oxygen ,  albuterol and Solu-Medrol and he improved. Treatment for  community-acquired pneumonia and was placed n.p.o. and swallowing  evaluation was ordered.  MBS recommended after BSE to fully  assess oropharyngeal phase swallow function.       Assessment / Plan / Recommendation Clinical Impression  Dysphagia Diagnosis: Mild oral phase dysphagia;Moderate  pharyngeal phase dysphagia Clinical impression: Pt. exhibited mild oral dysphgia due to  mildly decreased oral manipulation and transit to posterior oral  cavity with solid texture.  Moderate pharyngeal phase dysphagia  contains both sensory and motor impairments resulting in  aspiration with thin barium with both silent and audible  aspiration /penetration.  Pt.'s laryngeal elevation  and  pharyngeal peristalsis is decreased as well as tongue base  retraction leading to moderate residuals in pt.'s valleculae and  pyriform sinsuses.  In addition the UES is tight and possibly  altered/discoordinated due to what appears to be lower cervical  osteophyte causing baruim to backflow/ascend into pyriform  sinuses.  He has no awareness to pharyngeal residue and required  verbal cues to swallow 2 additional times to adequatley reduce  residue.   Chin tuck posture was not successful in decreasing  residue.  Brief scan of esophagus did not reveal overt  abnormalities.  Recommned Dys 3 diet and nectar thick liquids  with double swallows and volitional throat clears. Dysphagia  appears chronic at baseline, however if overall strength and  endurace are improved pt. may be better able to compensate and  return to thin liquids.     Treatment Recommendation  Therapy as outlined in treatment plan below    Diet Recommendation Dysphagia 3 (Mechanical Soft);Nectar-thick  liquid   Liquid Administration via: Cup;No straw Medication Administration: Whole meds with puree Supervision: Patient able to self feed;Full supervision/cueing  for compensatory strategies Compensations: Slow rate;Small sips/bites;Follow solids with  liquid;Clear throat intermittently;Multiple dry swallows after  each bite/sip Postural Changes and/or Swallow Maneuvers: Seated upright 90  degrees;Upright 30-60 min after meal    Other  Recommendations Oral Care Recommendations: Oral care BID Other Recommendations: Order thickener from pharmacy   Follow Up Recommendations   (to be determined)    Frequency and Duration min 2x/week  2 weeks   Pertinent Vitals/Pain     SLP Swallow Goals Patient will utilize recommended strategies during swallow to  increase swallowing safety with: Moderate cueing Goal #3: Family will verbalilze/demonstrate understanding of  swallow precautions with min assist.  Reason for Referral Objectively evaluate swallowing  function   Oral Phase Oral Preparation/Oral Phase Oral Phase: Impaired Oral - Solids Oral - Regular: Delayed oral transit;Weak lingual manipulation  (mild)   Pharyngeal Phase Pharyngeal Phase Pharyngeal Phase: Impaired Pharyngeal - Nectar Pharyngeal - Nectar Teaspoon: Pharyngeal residue - pyriform  sinuses;Pharyngeal residue - valleculae;Reduced tongue base  retraction;Reduced laryngeal elevation;Reduced pharyngeal  peristalsis;Reduced anterior laryngeal  mobility;Penetration/Aspiration during swallow;Reduced  airway/laryngeal closure;Delayed swallow initiation;Premature  spillage to valleculae (vallecuale-mod. pyriforms-mod) Penetration/Aspiration details (nectar teaspoon): Material enters  airway, remains ABOVE vocal cords then ejected out Pharyngeal - Nectar Cup: Reduced pharyngeal peristalsis;Reduced  anterior laryngeal mobility;Reduced laryngeal elevation;Reduced  airway/laryngeal closure;Reduced tongue base  retraction;Pharyngeal residue - pyriform sinuses;Pharyngeal  residue - valleculae;Delayed swallow initiation;Premature  spillage to valleculae (vallecuale-mod. pyriforms-mod) Pharyngeal - Thin Pharyngeal - Thin Teaspoon: Penetration/Aspiration during  swallow;Reduced pharyngeal peristalsis;Reduced anterior laryngeal  mobility;Reduced laryngeal elevation;Reduced airway/laryngeal  closure;Reduced tongue base retraction;Pharyngeal residue -  pyriform sinuses;Pharyngeal residue - valleculae;Delayed swallow  initiation;Premature spillage to valleculae Penetration/Aspiration details (thin teaspoon): Material enters  airway, passes BELOW cords and not ejected out despite cough  attempt by patient Pharyngeal - Thin Cup: Penetration/Aspiration during  swallow;Reduced pharyngeal peristalsis;Reduced anterior laryngeal  mobility;Reduced laryngeal elevation;Reduced airway/laryngeal  closure;Reduced tongue base retraction;Pharyngeal residue -  pyriform sinuses;Pharyngeal residue - valleculae;Delayed swallow   initiation;Premature spillage to valleculae (mild) Penetration/Aspiration details (thin cup): Material enters  airway, passes BELOW cords and not ejected out despite cough  attempt by patient;Material enters airway, passes BELOW cords  without attempt by patient to eject out (silent aspiration) Pharyngeal - Solids Pharyngeal - Puree: Delayed swallow initiation;Premature spillage  to valleculae;Pharyngeal residue - valleculae;Pharyngeal residue  - pyriform sinuses;Reduced laryngeal elevation;Reduced tongue  base retraction (vallecuale-mod. pyriforms-mod) Pharyngeal - Regular: Pharyngeal residue - valleculae;Pharyngeal  residue - pyriform sinuses (mild residue)  Cervical Esophageal Phase    GO    Cervical Esophageal Phase Cervical Esophageal Phase: Impaired Cervical Esophageal Phase - Comment Cervical Esophageal Comment: tight UES with backflow into  pyriforms with all consistencies         Breck Coons [place] M.Ed CCC-SLP Pager [phone]  08/22/2012     Microbiology: Recent Results (from the past 240 hour(s))  CULTURE, BLOOD (ROUTINE X 2)     Status: None   Collection Time    08/21/12  3:00 PM      Result Value Range Status   Specimen Description BLOOD RIGHT ARM   Final   Special Requests BOTTLES DRAWN AEROBIC AND ANAEROBIC 10CC   Final   Culture  Setup Time 08/21/2012 20:47   Final   Culture     Final   Value:        BLOOD CULTURE RECEIVED NO GROWTH TO DATE CULTURE WILL BE HELD FOR 5 DAYS BEFORE ISSUING A FINAL NEGATIVE REPORT   Report Status PENDING   Incomplete  CULTURE, BLOOD (ROUTINE X 2)     Status: None   Collection Time    08/21/12  3:15 PM      Result Value Range Status   Specimen Description BLOOD RIGHT HAND   Final   Special Requests BOTTLES DRAWN AEROBIC AND ANAEROBIC 10CC   Final   Culture  Setup Time 08/21/2012 20:47   Final   Culture     Final   Value:        BLOOD CULTURE RECEIVED NO GROWTH TO DATE CULTURE WILL BE HELD FOR 5 DAYS BEFORE ISSUING A FINAL NEGATIVE REPORT   Report  Status PENDING   Incomplete  Labs: Basic Metabolic Panel:  Recent Labs Lab 08/21/12 1150 08/21/12 1521 08/22/12 0646 08/23/12 0500  NA 138  --  139 139  K 3.7  --  3.6 3.3*  CL 104  --  106 107  CO2 21  --  22 24  GLUCOSE 146*  --  193* 170*  BUN 30*  --  25* 29*  CREATININE 1.35 1.36* 1.23 1.25  CALCIUM 9.7  --  9.2 9.0   Liver Function Tests:  Recent Labs Lab 08/22/12 0646  AST 17  ALT 14  ALKPHOS 55  BILITOT 0.4  PROT 7.0  ALBUMIN 3.0*   No results found for this basename: LIPASE, AMYLASE,  in the last 168 hours No results found for this basename: AMMONIA,  in the last 168 hours CBC:  Recent Labs Lab 08/21/12 1150 08/21/12 1521 08/22/12 0646 08/23/12 0500  WBC 8.0 7.9 7.9 9.2  HGB 11.6* 12.0* 10.4* 10.5*  HCT 33.8* 35.7* 30.3* 31.0*  MCV 89.7 91.8 89.6 91.7  PLT 127* 149* 129* 131*   Cardiac Enzymes: No results found for this basename: CKTOTAL, CKMB, CKMBINDEX, TROPONINI,  in the last 168 hours BNP: BNP (last 3 results) No results found for this basename: PROBNP,  in the last 8760 hours CBG:  Recent Labs Lab 08/22/12 0619 08/22/12 0751 08/22/12 1707 08/23/12 0043 08/23/12 0630  GLUCAP 176* 175* 169* 174* 166*       Signed:  Marinda Elk  [facility] 08/24/2012, 8:24 AM

## 2012-08-24 NOTE — Progress Notes (Addendum)
SATURATION QUALIFICATIONS: (This note is used to comply with regulatory documentation for home oxygen)  Patient Saturations on Room Air at Rest = 90%  Patient Saturations on Room Air while Ambulating = 84%  Patient Saturations on 2 Liters of oxygen while Ambulating = 91%  Please briefly explain why patient needs home oxygen: Pt dropped O2 sats to 84 % while ambulating down the hallway about 25 feet, could not recover with purse lipped breathing, applied 2 liters O2 and pt came up to 91% while ambulating back to his room.  Ignacia Palma, [place] [phone] 08/24/2012

## 2012-08-24 NOTE — Progress Notes (Signed)
Speech Language Pathology Dysphagia Treatment Patient Details Name: Lucas Obrien MRN: [id number] DOB: 10-08-1924 Today's Date: 08/24/2012 Time: 4098-1191 SLP Time Calculation (min): 22 min  Assessment / Plan / Recommendation Clinical Impression  Pt. appears to be tolerateing current diet, with diminished lung sounds, and no fever.  Pt. was able to verbalize that he needed to swallow 3 times on each bite/sip, but did not appear to recall why.  SLP reiterated reason for 3 swallows, (to clear residue from throat to prevent spillage into airway).  Patient was able to demonstrate this with min. cues.  Patient was also educated to listen and clear (cough/clear throat and swallow) when wet/gurgly voice quality is noted.  SLP reiterated to son that patient should return as an OP for a repeat MBS prior to advancing to thin liquids.  Pt. to have f/u [facility] SLP after d/c.  Son verbalized understanding, and had thickener and aspiration precautions packed to go home.    Diet Recommendation  Continue with Current Diet: Dysphagia 3 (mechanical soft);Nectar-thick liquid    SLP Plan Discharge SLP treatment due to (comment);All goals met (d/c home)   Pertinent Vitals/Pain n/a   Swallowing Goals  SLP Swallowing Goals Patient will consume recommended diet without observed clinical signs of aspiration with: Minimal assistance Swallow Study Goal #1 - Progress: Met Patient will utilize recommended strategies during swallow to increase swallowing safety with: Minimal cueing Swallow Study Goal #2 - Progress: Met Swallow Study Goal #3 - Progress: Met  General Temperature Spikes Noted: No Respiratory Status: Supplemental O2 delivered via (comment) Behavior/Cognition: Alert;Cooperative;Pleasant mood;Requires cueing Oral Cavity - Dentition: Adequate natural dentition Patient Positioning: Upright in chair  Oral Cavity - Oral Hygiene Does patient have any of the following "at risk" factors?: Oxygen therapy - cannula,  mask, simple oxygen devices Brush patient's teeth BID with toothbrush (using toothpaste with fluoride): Yes Patient is HIGH RISK - Oral Care Protocol followed (see row info): Yes Patient is AT RISK - Oral Care Protocol followed (see row info): Yes   Dysphagia Treatment Treatment focused on: Skilled observation of diet tolerance;Patient/family/caregiver education;Utilization of compensatory strategies Family/Caregiver Educated: son Treatment Methods/Modalities: Skilled observation Patient observed directly with PO's: Yes Type of PO's observed: Nectar-thick liquids Feeding: Able to feed self;Needs set up Liquids provided via: Cup;No straw Pharyngeal Phase Signs & Symptoms: Wet vocal quality (Was wet prior to po's as well.) Type of cueing: Verbal Amount of cueing: Moderate   GO     Lucas Obrien T 08/24/2012, 12:32 PM

## 2012-08-24 NOTE — Plan of Care (Signed)
Problem: Phase II Progression Outcomes Goal: Wean O2 if indicated Outcome: Not Met (add Reason) Will return home with O2

## 2012-08-28 LAB — CULTURE, BLOOD (ROUTINE X 2): Culture: NO GROWTH

## 2012-09-07 ENCOUNTER — Ambulatory Visit (INDEPENDENT_AMBULATORY_CARE_PROVIDER_SITE_OTHER): Payer: Medicare Other | Admitting: Family Medicine

## 2012-09-07 ENCOUNTER — Encounter: Payer: Self-pay | Admitting: Family Medicine

## 2012-09-07 VITALS — BP 110/60 | HR 56 | Temp 97.8°F | Ht 65.0 in | Wt 192.4 lb

## 2012-09-07 DIAGNOSIS — J690 Pneumonitis due to inhalation of food and vomit: Secondary | ICD-10-CM

## 2012-09-07 NOTE — Progress Notes (Signed)
  Subjective:    Patient ID: Lucas Obrien, male    DOB: 01-11-1925, 79 y.o.   MRN: [id number]  HPI Hospital f/u- pt was admitted 3/24 w/ CAP vs aspiration PNA.  Pt reports feeling 'fine' currently.  Has completed course of Levaquin.  Wearing O2 via nasal cannula.  Home O2 ~93% but will then fall to 88% w/out O2.  Home CBGs 107-150s.  No CP, no SOB.  No fever.  Pt was recommended to start Dysphagia 3 diet.  Pt wouldn't tolerate the nectar thick liquids so last week restarted normal thin liquids.  Now sleeping w/ a wedge.  No longer wheezing.  Now using Spiriva daily, Symbicort twice daily.  Will cough w/ thin liquids.   Review of Systems For ROS see HPI     Objective:   Physical Exam  Vitals reviewed. Constitutional: He appears well-developed and well-nourished. No distress.  HENT:  Head: Normocephalic and atraumatic.  O2 via nasal cannula  Neck: Normal range of motion. Neck supple.  Cardiovascular: Normal rate, regular rhythm and normal heart sounds.   Pulmonary/Chest: Effort normal and breath sounds normal. No respiratory distress. He has no wheezes. He has no rales.  Lymphadenopathy:    He has no cervical adenopathy.  Neurological: He is alert.  Skin: Skin is warm and dry.  Psychiatric: He has a normal mood and affect. His behavior is normal.          Assessment & Plan:

## 2012-09-07 NOTE — Patient Instructions (Addendum)
Follow up in 1 month to recheck sugars If he again starts coughing w/ thin liquids, start to thicken them Continue the Oxygen Call with any questions or concerns Hang in there!

## 2012-09-08 ENCOUNTER — Telehealth: Payer: Self-pay | Admitting: *Deleted

## 2012-09-08 NOTE — Telephone Encounter (Signed)
Physical therapist left VM stating that son advise them that Dr Guss Bunde informed them that it is ok to wean Pt off PO2 as long as oxygen level remain normal with activity Verbal orders needed if Dr Beverely Low is in agreement..Please advise

## 2012-09-08 NOTE — Telephone Encounter (Signed)
Pt can only wean O2 if his sats remain >88% while ambulating.

## 2012-09-08 NOTE — Telephone Encounter (Signed)
Left Detail of Dr Beverely Low comment and to have advance to return call to confirm VM received.

## 2012-09-11 NOTE — Telephone Encounter (Signed)
Spoke with Lucas Obrien message was received.

## 2012-09-18 NOTE — Assessment & Plan Note (Addendum)
New to provider.  Reviewed dx w/ pt's son and the importance of thickening liquids.  Son does admit that pt coughs when drinking thin liquids.  Pt to continue home O2 until sats remain >88% when ambulating.  Pt currently well appearing.  Will continue to follow.

## 2012-09-19 ENCOUNTER — Other Ambulatory Visit: Payer: Self-pay | Admitting: Family Medicine

## 2012-09-20 ENCOUNTER — Encounter (HOSPITAL_COMMUNITY): Payer: Self-pay | Admitting: *Deleted

## 2012-09-20 ENCOUNTER — Inpatient Hospital Stay (HOSPITAL_COMMUNITY)
Admission: EM | Admit: 2012-09-20 | Discharge: 2012-09-23 | DRG: 292 | Disposition: A | Discharge status: Home Health | Payer: Medicare Other | Attending: Internal Medicine | Admitting: Internal Medicine

## 2012-09-20 ENCOUNTER — Emergency Department (HOSPITAL_COMMUNITY): Payer: Medicare Other

## 2012-09-20 DIAGNOSIS — Z880 Allergy status to penicillin: Secondary | ICD-10-CM

## 2012-09-20 DIAGNOSIS — I129 Hypertensive chronic kidney disease with stage 1 through stage 4 chronic kidney disease, or unspecified chronic kidney disease: Secondary | ICD-10-CM | POA: Diagnosis present

## 2012-09-20 DIAGNOSIS — Z87891 Personal history of nicotine dependence: Secondary | ICD-10-CM

## 2012-09-20 DIAGNOSIS — Z91038 Other insect allergy status: Secondary | ICD-10-CM

## 2012-09-20 DIAGNOSIS — E1129 Type 2 diabetes mellitus with other diabetic kidney complication: Secondary | ICD-10-CM | POA: Diagnosis present

## 2012-09-20 DIAGNOSIS — N390 Urinary tract infection, site not specified: Secondary | ICD-10-CM

## 2012-09-20 DIAGNOSIS — E785 Hyperlipidemia, unspecified: Secondary | ICD-10-CM | POA: Diagnosis present

## 2012-09-20 DIAGNOSIS — Z951 Presence of aortocoronary bypass graft: Secondary | ICD-10-CM

## 2012-09-20 DIAGNOSIS — F0390 Unspecified dementia without behavioral disturbance: Secondary | ICD-10-CM | POA: Diagnosis present

## 2012-09-20 DIAGNOSIS — Z809 Family history of malignant neoplasm, unspecified: Secondary | ICD-10-CM

## 2012-09-20 DIAGNOSIS — J440 Chronic obstructive pulmonary disease with acute lower respiratory infection: Secondary | ICD-10-CM | POA: Diagnosis present

## 2012-09-20 DIAGNOSIS — I2510 Atherosclerotic heart disease of native coronary artery without angina pectoris: Secondary | ICD-10-CM | POA: Diagnosis present

## 2012-09-20 DIAGNOSIS — I5031 Acute diastolic (congestive) heart failure: Principal | ICD-10-CM | POA: Diagnosis present

## 2012-09-20 DIAGNOSIS — N058 Unspecified nephritic syndrome with other morphologic changes: Secondary | ICD-10-CM | POA: Diagnosis present

## 2012-09-20 DIAGNOSIS — R4182 Altered mental status, unspecified: Secondary | ICD-10-CM

## 2012-09-20 DIAGNOSIS — IMO0002 Reserved for concepts with insufficient information to code with codable children: Secondary | ICD-10-CM | POA: Diagnosis present

## 2012-09-20 DIAGNOSIS — J209 Acute bronchitis, unspecified: Secondary | ICD-10-CM | POA: Diagnosis present

## 2012-09-20 DIAGNOSIS — Z7982 Long term (current) use of aspirin: Secondary | ICD-10-CM

## 2012-09-20 DIAGNOSIS — R509 Fever, unspecified: Secondary | ICD-10-CM

## 2012-09-20 DIAGNOSIS — E1165 Type 2 diabetes mellitus with hyperglycemia: Secondary | ICD-10-CM | POA: Diagnosis present

## 2012-09-20 DIAGNOSIS — R0982 Postnasal drip: Secondary | ICD-10-CM

## 2012-09-20 DIAGNOSIS — J449 Chronic obstructive pulmonary disease, unspecified: Secondary | ICD-10-CM | POA: Diagnosis present

## 2012-09-20 DIAGNOSIS — N182 Chronic kidney disease, stage 2 (mild): Secondary | ICD-10-CM | POA: Diagnosis present

## 2012-09-20 DIAGNOSIS — J9610 Chronic respiratory failure, unspecified whether with hypoxia or hypercapnia: Secondary | ICD-10-CM | POA: Diagnosis present

## 2012-09-20 DIAGNOSIS — Z8261 Family history of arthritis: Secondary | ICD-10-CM

## 2012-09-20 DIAGNOSIS — I10 Essential (primary) hypertension: Secondary | ICD-10-CM | POA: Diagnosis present

## 2012-09-20 DIAGNOSIS — R7989 Other specified abnormal findings of blood chemistry: Secondary | ICD-10-CM

## 2012-09-20 DIAGNOSIS — J690 Pneumonitis due to inhalation of food and vomit: Secondary | ICD-10-CM

## 2012-09-20 DIAGNOSIS — R42 Dizziness and giddiness: Secondary | ICD-10-CM

## 2012-09-20 DIAGNOSIS — I4891 Unspecified atrial fibrillation: Secondary | ICD-10-CM | POA: Diagnosis present

## 2012-09-20 DIAGNOSIS — Z79899 Other long term (current) drug therapy: Secondary | ICD-10-CM

## 2012-09-20 DIAGNOSIS — D72829 Elevated white blood cell count, unspecified: Secondary | ICD-10-CM

## 2012-09-20 DIAGNOSIS — Z823 Family history of stroke: Secondary | ICD-10-CM

## 2012-09-20 DIAGNOSIS — Z8249 Family history of ischemic heart disease and other diseases of the circulatory system: Secondary | ICD-10-CM

## 2012-09-20 DIAGNOSIS — I509 Heart failure, unspecified: Secondary | ICD-10-CM | POA: Diagnosis present

## 2012-09-20 HISTORY — DX: Atherosclerotic heart disease of native coronary artery without angina pectoris: I25.10

## 2012-09-20 HISTORY — DX: Cardiac arrhythmia, unspecified: I49.9

## 2012-09-20 LAB — CBC
MCH: 30.4 pg (ref 26.0–34.0)
MCHC: 33.4 g/dL (ref 30.0–36.0)
Platelets: 148 10*3/uL — ABNORMAL LOW (ref 150–400)

## 2012-09-20 LAB — CK TOTAL AND CKMB (NOT AT ARMC)
Relative Index: 2 (ref 0.0–2.5)
Total CK: 306 U/L — ABNORMAL HIGH (ref 7–232)

## 2012-09-20 LAB — GLUCOSE, CAPILLARY
Glucose-Capillary: 207 mg/dL — ABNORMAL HIGH (ref 70–99)
Glucose-Capillary: 212 mg/dL — ABNORMAL HIGH (ref 70–99)

## 2012-09-20 LAB — TROPONIN I
Troponin I: <0.3 ng/mL (ref <0.30)
Troponin I: <0.3 ng/mL (ref <0.30)

## 2012-09-20 LAB — COMPREHENSIVE METABOLIC PANEL
AST: 17 U/L (ref 0–37)
Albumin: 3.1 g/dL — ABNORMAL LOW (ref 3.5–5.2)
Alkaline Phosphatase: 65 U/L (ref 39–117)
BUN: 21 mg/dL (ref 6–23)
Chloride: 108 mEq/L (ref 96–112)
Potassium: 3.7 mEq/L (ref 3.5–5.1)
Total Protein: 7 g/dL (ref 6.0–8.3)

## 2012-09-20 LAB — CBC WITH DIFFERENTIAL/PLATELET
Basophils Absolute: 0 10*3/uL (ref 0.0–0.1)
Basophils Relative: 0 % (ref 0–1)
Eosinophils Absolute: 0.2 10*3/uL (ref 0.0–0.7)
Hemoglobin: 10.8 g/dL — ABNORMAL LOW (ref 13.0–17.0)
MCH: 31.1 pg (ref 26.0–34.0)
MCHC: 33.8 g/dL (ref 30.0–36.0)
Monocytes Relative: 12 % (ref 3–12)
Neutro Abs: 5.1 10*3/uL (ref 1.7–7.7)
Neutrophils Relative %: 68 % (ref 43–77)
Platelets: 136 10*3/uL — ABNORMAL LOW (ref 150–400)
RDW: 14.6 % (ref 11.5–15.5)

## 2012-09-20 LAB — CREATININE, SERUM: Creatinine, Ser: 1.4 mg/dL — ABNORMAL HIGH (ref 0.50–1.35)

## 2012-09-20 LAB — PRO B NATRIURETIC PEPTIDE: Pro B Natriuretic peptide (BNP): 7131 pg/mL — ABNORMAL HIGH (ref 0–450)

## 2012-09-20 MED ORDER — SODIUM CHLORIDE 0.9 % IV SOLN: 250.0000 mL | INTRAVENOUS | Status: DC | PRN

## 2012-09-20 MED ORDER — FUROSEMIDE 10 MG/ML IJ SOLN
20.0000 mg | Freq: Once | INTRAMUSCULAR | Status: AC
  Administered 2012-09-20: 20 mg via INTRAVENOUS
  Filled 2012-09-20: qty 2

## 2012-09-20 MED ORDER — ONDANSETRON HCL 4 MG/2ML IJ SOLN: 4.0000 mg | Freq: Four times a day (QID) | INTRAMUSCULAR | Status: DC | PRN

## 2012-09-20 MED ORDER — METOPROLOL TARTRATE 100 MG PO TABS
100.0000 mg | ORAL_TABLET | Freq: Two times a day (BID) | ORAL | Status: DC
  Administered 2012-09-20 – 2012-09-23 (×6): 100 mg via ORAL
  Filled 2012-09-20 (×7): qty 1

## 2012-09-20 MED ORDER — TAMSULOSIN HCL 0.4 MG PO CAPS
0.4000 mg | ORAL_CAPSULE | Freq: Every day | ORAL | Status: DC
  Administered 2012-09-20 – 2012-09-23 (×4): 0.4 mg via ORAL
  Filled 2012-09-20 (×4): qty 1

## 2012-09-20 MED ORDER — BUDESONIDE-FORMOTEROL FUMARATE 160-4.5 MCG/ACT IN AERO
2.0000 | INHALATION_SPRAY | Freq: Two times a day (BID) | RESPIRATORY_TRACT | Status: DC
  Administered 2012-09-20 – 2012-09-23 (×6): 2 via RESPIRATORY_TRACT
  Filled 2012-09-20: qty 6

## 2012-09-20 MED ORDER — IPRATROPIUM BROMIDE 0.02 % IN SOLN
0.5000 mg | Freq: Four times a day (QID) | RESPIRATORY_TRACT | Status: DC
  Administered 2012-09-20 – 2012-09-21 (×3): 0.5 mg via RESPIRATORY_TRACT
  Filled 2012-09-20 (×3): qty 2.5

## 2012-09-20 MED ORDER — ENOXAPARIN SODIUM 30 MG/0.3ML ~~LOC~~ SOLN
30.0000 mg | SUBCUTANEOUS | Status: DC
  Administered 2012-09-20 – 2012-09-22 (×3): 30 mg via SUBCUTANEOUS
  Filled 2012-09-20 (×4): qty 0.3

## 2012-09-20 MED ORDER — LORATADINE 10 MG PO TABS
10.0000 mg | ORAL_TABLET | Freq: Every day | ORAL | Status: DC
  Administered 2012-09-20 – 2012-09-23 (×4): 10 mg via ORAL
  Filled 2012-09-20 (×4): qty 1

## 2012-09-20 MED ORDER — ASPIRIN 325 MG PO TABS
325.0000 mg | ORAL_TABLET | Freq: Every morning | ORAL | Status: DC
  Administered 2012-09-21 – 2012-09-23 (×3): 325 mg via ORAL
  Filled 2012-09-20 (×3): qty 1

## 2012-09-20 MED ORDER — LEVALBUTEROL HCL 0.63 MG/3ML IN NEBU
0.6300 mg | INHALATION_SOLUTION | Freq: Four times a day (QID) | RESPIRATORY_TRACT | Status: DC
  Administered 2012-09-20 – 2012-09-21 (×2): 0.63 mg via RESPIRATORY_TRACT
  Filled 2012-09-20 (×7): qty 3

## 2012-09-20 MED ORDER — ACETAMINOPHEN 325 MG PO TABS: 650.0000 mg | ORAL_TABLET | ORAL | Status: DC | PRN

## 2012-09-20 MED ORDER — ONDANSETRON HCL 4 MG/2ML IJ SOLN
4.0000 mg | Freq: Once | INTRAMUSCULAR | Status: AC
  Administered 2012-09-20: 4 mg via INTRAVENOUS
  Filled 2012-09-20: qty 2

## 2012-09-20 MED ORDER — AMLODIPINE BESYLATE 5 MG PO TABS
5.0000 mg | ORAL_TABLET | Freq: Every morning | ORAL | Status: DC
  Administered 2012-09-20 – 2012-09-23 (×4): 5 mg via ORAL
  Filled 2012-09-20 (×4): qty 1

## 2012-09-20 MED ORDER — MEMANTINE HCL 10 MG PO TABS
10.0000 mg | ORAL_TABLET | Freq: Two times a day (BID) | ORAL | Status: DC
  Administered 2012-09-20 – 2012-09-23 (×6): 10 mg via ORAL
  Filled 2012-09-20 (×7): qty 1

## 2012-09-20 MED ORDER — FUROSEMIDE 10 MG/ML IJ SOLN
40.0000 mg | Freq: Once | INTRAMUSCULAR | Status: AC
  Administered 2012-09-20: 40 mg via INTRAVENOUS
  Filled 2012-09-20: qty 4

## 2012-09-20 MED ORDER — DONEPEZIL HCL 5 MG PO TABS
5.0000 mg | ORAL_TABLET | Freq: Every day | ORAL | Status: DC
  Administered 2012-09-20 – 2012-09-22 (×3): 5 mg via ORAL
  Filled 2012-09-20 (×4): qty 1

## 2012-09-20 MED ORDER — FUROSEMIDE 10 MG/ML IJ SOLN
40.0000 mg | Freq: Every day | INTRAMUSCULAR | Status: DC
  Filled 2012-09-20: qty 4

## 2012-09-20 MED ORDER — METHYLPREDNISOLONE SODIUM SUCC 125 MG IJ SOLR
125.0000 mg | Freq: Once | INTRAMUSCULAR | Status: AC
  Administered 2012-09-20: 125 mg via INTRAVENOUS
  Filled 2012-09-20: qty 2

## 2012-09-20 MED ORDER — IPRATROPIUM BROMIDE 0.06 % NA SOLN
1.0000 | Freq: Three times a day (TID) | NASAL | Status: DC
  Administered 2012-09-21 – 2012-09-22 (×5): 1 via NASAL
  Filled 2012-09-20: qty 15

## 2012-09-20 MED ORDER — SODIUM CHLORIDE 0.9 % IJ SOLN: 3.0000 mL | INTRAMUSCULAR | Status: DC | PRN

## 2012-09-20 MED ORDER — VITAMIN B-1 100 MG PO TABS
100.0000 mg | ORAL_TABLET | Freq: Every day | ORAL | Status: DC
  Administered 2012-09-20 – 2012-09-23 (×4): 100 mg via ORAL
  Filled 2012-09-20 (×5): qty 1

## 2012-09-20 MED ORDER — ALBUTEROL SULFATE (5 MG/ML) 0.5% IN NEBU
5.0000 mg | INHALATION_SOLUTION | Freq: Once | RESPIRATORY_TRACT | Status: AC
  Administered 2012-09-20: 5 mg via RESPIRATORY_TRACT
  Filled 2012-09-20: qty 1

## 2012-09-20 MED ORDER — IPRATROPIUM BROMIDE 0.02 % IN SOLN
0.5000 mg | Freq: Once | RESPIRATORY_TRACT | Status: AC
  Administered 2012-09-20: 0.5 mg via RESPIRATORY_TRACT
  Filled 2012-09-20: qty 2.5

## 2012-09-20 MED ORDER — SODIUM CHLORIDE 0.9 % IJ SOLN
3.0000 mL | Freq: Two times a day (BID) | INTRAMUSCULAR | Status: DC
  Administered 2012-09-20 – 2012-09-22 (×5): 3 mL via INTRAVENOUS

## 2012-09-20 MED ORDER — LEVALBUTEROL HCL 0.63 MG/3ML IN NEBU
0.6300 mg | INHALATION_SOLUTION | RESPIRATORY_TRACT | Status: DC | PRN
  Filled 2012-09-20: qty 3

## 2012-09-20 MED ORDER — SIMVASTATIN 10 MG PO TABS
10.0000 mg | ORAL_TABLET | Freq: Every day | ORAL | Status: DC
  Administered 2012-09-20 – 2012-09-22 (×3): 10 mg via ORAL
  Filled 2012-09-20 (×4): qty 1

## 2012-09-20 MED ORDER — GUAIFENESIN ER 600 MG PO TB12
600.0000 mg | ORAL_TABLET | Freq: Every day | ORAL | Status: DC
  Administered 2012-09-20 – 2012-09-23 (×4): 600 mg via ORAL
  Filled 2012-09-20 (×4): qty 1

## 2012-09-20 NOTE — ED Notes (Signed)
Pt still coughing up thick green/yellow sputum

## 2012-09-20 NOTE — Telephone Encounter (Signed)
Med filled.  

## 2012-09-20 NOTE — ED Notes (Signed)
Per PTAR:  Pt from home.  pts son reports that pt urinated on himself this morning.  Pts son also reports difficulty breathing, pt labored breathing.  Hx of COPD and dementia.  Treated 2 weeks ago for PNA.  87% on 2 L-pts norm.  Pt ambulatory.  No distress noted.

## 2012-09-20 NOTE — H&P (Signed)
History and Physical       Hospital Admission Note Date: 09/20/2012  Patient name: Lucas Obrien Medical record number: [id number] Date of birth: November 06, 1924 Age: 79 y.o. Gender: male PCP: Neena Rhymes, MD    Chief Complaint:   Difficulty breathing today  HPI:  Patient is 79 year old male with history of coronary artery disease, CABG, atrial fibrillation, hypertension, hyperlipidemia, BPH who was recently admitted last month for pneumonia presented to ED today with difficulty breathing. History was obtained from the patient and his family. Patient has been in his usual state of health state of health up to this morning when he started having difficulty breathing. Per patient's son, he noticed his father to be confused this morning and urinated on himself and breathing hard. Patient has a history of COPD on 2 L of oxygen all times. Patient has been feeling more comfortable on the recliner, has noticed orthopnea on laying flat and worsening pedal edema. He denied any chest pain. He denied any fevers or chills but has been coughing and wheezing today. ED workup showed elevated BNP of 7131, troponin less than 0.3. Chest x-ray showed pulmonary edema with cardiomegaly   Review of Systems:  Constitutional: Denies fever, chills, diaphoresis, poor appetite and fatigue.  HEENT: Denies photophobia, eye pain, redness, hearing loss, ear pain, congestion, sore throat, rhinorrhea, sneezing, mouth sores, trouble swallowing, neck pain, neck stiffness and tinnitus.   Respiratory: Please see history of present illness  Cardiovascular: Please see history of present illness  Gastrointestinal: Denies nausea, vomiting, abdominal pain, diarrhea, constipation, blood in stool and abdominal distention.  Genitourinary: Patient has a history of BPH but no current dysuria or hematuria Musculoskeletal: Denies myalgias, back pain, joint swelling, arthralgias and  gait problem.  Skin: Denies pallor, rash and wound.  Neurological: Denies dizziness, seizures, syncope, weakness, light-headedness, numbness and headaches.  Hematological: Denies adenopathy. Easy bruising, personal or family bleeding history  Psychiatric/Behavioral: Denies suicidal ideation, mood changes, confusion, nervousness, sleep disturbance and agitation  Past Medical History: Past Medical History  Diagnosis Date  . Heart disease   . Hypertension   . Hyperlipidemia   . Urine incontinence   . Alcohol problem drinking   . Emphysema of lung   . Type II diabetes mellitus   . Chronic kidney disease     "moderate"  . Arthritis     "all over"  . Gout   . Dementia   . UTI (lower urinary tract infection) 12/08/2011    "first one"  . Shortness of breath   . Pneumonia 08/21/2012   Past Surgical History  Procedure Laterality Date  . Coronary artery bypass graft  1994    Medications: Prior to Admission medications   Medication Sig Start Date End Date Taking? Authorizing Provider  allopurinol (ZYLOPRIM) 300 MG tablet Take 1 tablet (300 mg total) by mouth every morning. 08/21/12  Yes Sheliah Hatch, MD  amLODipine (NORVASC) 5 MG tablet Take 1 tablet (5 mg total) by mouth every morning. 08/21/12  Yes Sheliah Hatch, MD  aspirin 325 MG tablet Take 325 mg by mouth every morning.    Yes Historical Provider, MD  budesonide-formoterol (SYMBICORT) 160-4.5 MCG/ACT inhaler Inhale 2 puffs into the lungs 2 (two) times daily. 08/16/12  Yes Sheliah Hatch, MD  cetirizine (ZYRTEC) 10 MG tablet Take 10 mg by mouth daily.   Yes Historical Provider, MD  donepezil (ARICEPT) 5 MG tablet Take 1 tablet (5 mg total) by mouth at bedtime. 07/10/12 07/10/13 Yes Sheliah Hatch,  MD  guaiFENesin (MUCINEX) 600 MG 12 hr tablet Take 600 mg by mouth daily.   Yes Historical Provider, MD  ipratropium (ATROVENT) 0.06 % nasal spray Place 1 spray into the nose 3 (three) times daily. 04/07/12  Yes Sheliah Hatch, MD  meclizine (ANTIVERT) 25 MG tablet Take 25 mg by mouth daily as needed for dizziness.   Yes Historical Provider, MD  memantine (NAMENDA) 10 MG tablet Take 10 mg by mouth 2 (two) times daily.     Yes Historical Provider, MD  metoprolol (LOPRESSOR) 100 MG tablet Take 1 tablet (100 mg total) by mouth 2 (two) times daily. 07/25/12  Yes Sheliah Hatch, MD  simvastatin (ZOCOR) 10 MG tablet Take 1 tablet (10 mg total) by mouth at bedtime. 03/31/12  Yes Sheliah Hatch, MD  SPIRIVA HANDIHALER 18 MCG inhalation capsule inhale contents of 1 capsule by mouth once daily 09/19/12  Yes Sheliah Hatch, MD  tamsulosin (FLOMAX) 0.4 MG CAPS Take 1 capsule (0.4 mg total) by mouth daily. 07/25/12  Yes Sheliah Hatch, MD  thiamine (VITAMIN B-1) 100 MG tablet Take 100 mg by mouth daily.   Yes Historical Provider, MD  valsartan-hydrochlorothiazide (DIOVAN-HCT) 320-25 MG per tablet Take 1 tablet by mouth daily. 07/25/12  Yes Sheliah Hatch, MD    Allergies:   Allergies  Allergen Reactions  . Bee Venom     Unknown  . Penicillins     Unknown reaction    Social History:  reports that he quit smoking about 4 years ago. His smoking use included Cigarettes. He has a 33.5 pack-year smoking history. He has never used smokeless tobacco. He reports that  drinks alcohol. He reports that he does not use illicit drugs. he lives at home with his son and daughter, ambulates with a walker  Family History: Family History  Problem Relation Age of Onset  . Arthritis Mother   . Cancer Father   . Hyperlipidemia Father   . Heart disease Father   . Hypertension Father   . Diabetes Father   . Stroke Sister   . Stroke Brother     Physical Exam: Blood pressure 143/72, pulse 69, temperature 99.2 F (37.3 C), temperature source Oral, resp. rate 30, SpO2 94.00%. General: Alert, awake, oriented x3, in no acute distress. HEENT: normocephalic, atraumatic, anicteric sclera, pink conjunctiva, pupils equal  and reactive to light and accomodation, oropharynx clear Neck: supple, no masses or lymphadenopathy, no goiter, no bruits  Heart: ireg ireg Lungs: Bibasilar crackles with mild expiratory wheezing Abdomen: Soft, nontender, nondistended, positive bowel sounds, no masses. Extremities: No clubbing, cyanosis. 1+ edema Neuro: Grossly intact, no focal neurological deficits, strength 5/5 upper and lower extremities bilaterally Psych: alert and oriented x 3, normal mood and affect Skin: no rashes or lesions, warm and dry   LABS on Admission:  Basic Metabolic Panel:  Recent Labs Lab 09/20/12 0926  NA 141  K 3.7  CL 108  CO2 25  GLUCOSE 167*  BUN 21  CREATININE 1.50*  CALCIUM 9.0   Liver Function Tests:  Recent Labs Lab 09/20/12 0926  AST 17  ALT 11  ALKPHOS 65  BILITOT 0.3  PROT 7.0  ALBUMIN 3.1*   No results found for this basename: LIPASE, AMYLASE,  in the last 168 hours No results found for this basename: AMMONIA,  in the last 168 hours CBC:  Recent Labs Lab 09/20/12 0926  WBC 7.5  NEUTROABS 5.1  HGB 10.8*  HCT 32.0*  MCV  92.2  PLT 136*   Cardiac Enzymes:  Recent Labs Lab 09/20/12 1325  TROPONINI <0.30   BNP: No components found with this basename: POCBNP,  CBG: No results found for this basename: GLUCAP,  in the last 168 hours   Radiological Exams on Admission: Dg Chest Port 1 View  09/20/2012  *RADIOLOGY REPORT*  Clinical Data: Shortness of breath and cough.  PORTABLE CHEST - 1 VIEW  Comparison: PA and lateral chest 08/21/2012 and 02/13/2012.  Findings: There is cardiomegaly with bilateral alveolar and interstitial opacities.  Small right effusion is noted.  No pneumothorax.  The patient is status post median sternotomy.  IMPRESSION: Cardiomegaly and pulmonary edema.  Small right effusion noted.   Original Report Authenticated By: Holley Dexter, M.D.    EKG showed rate 69, atrial fibrillation, PACs, T-wave inversion in V5, V6 (not  new)  Assessment/Plan Principal Problem:   CHF, acute: No prior history of CHF per patient, EF unknown - Admit to telemetry, obtain serial cardiac enzymes, 2-D echocardiogram - Placed on Lasix daily, beta blocker, aspirin  - Hold ARB/HCTZ secondary to renal insufficiency - Strict I.'s and O.'s and daily weights  Active Problems:   Hyperlipidemia - Continue statins    CAD (coronary artery disease): - Obtain echo, serial cardiac enzymes, no chest pain    HTN (hypertension) - Stable, continue metoprolol, Norvasc and Lasix    DM (diabetes mellitus), type 2, uncontrolled, with renal complications: diet controlled    Dementia - Continue Namenda and Aricept    Atrial fibrillation - Discussed with patient's son in detail, he confirms that the patient has a history of atrial fibrillation, he is on aspirin    Bronchitis, acute with a history of chronic respiratory failure, COPD - Placed on scheduled Xopenex and Atrovent nebs, O2    CKD (chronic kidney disease), stage II - Monitor creatinine function closely with diuresis  DVT prophylaxis: Lovenox  CODE STATUS: Full CODE STATUS, addressed with the patient  Further plan will depend as patient's clinical course evolves and further radiologic and laboratory data become available.   Time Spent on Admission: 1 hour  RAI,RIPUDEEP M.D. [facility] 09/20/2012, 3:54 PM Pager: [phone]  If 7PM-7AM, please contact night-coverage [email or web address] Password [email or web address]

## 2012-09-20 NOTE — Progress Notes (Signed)
Pt admitted from ED. Placed on tele monitor, oriented troom and VS done.

## 2012-09-21 DIAGNOSIS — I509 Heart failure, unspecified: Secondary | ICD-10-CM

## 2012-09-21 DIAGNOSIS — E1165 Type 2 diabetes mellitus with hyperglycemia: Secondary | ICD-10-CM

## 2012-09-21 DIAGNOSIS — N182 Chronic kidney disease, stage 2 (mild): Secondary | ICD-10-CM

## 2012-09-21 DIAGNOSIS — E1129 Type 2 diabetes mellitus with other diabetic kidney complication: Secondary | ICD-10-CM

## 2012-09-21 DIAGNOSIS — I2510 Atherosclerotic heart disease of native coronary artery without angina pectoris: Secondary | ICD-10-CM

## 2012-09-21 LAB — GLUCOSE, CAPILLARY
Glucose-Capillary: 161 mg/dL — ABNORMAL HIGH (ref 70–99)
Glucose-Capillary: 168 mg/dL — ABNORMAL HIGH (ref 70–99)

## 2012-09-21 LAB — BASIC METABOLIC PANEL
BUN: 23 mg/dL (ref 6–23)
CO2: 27 mEq/L (ref 19–32)
Calcium: 9.4 mg/dL (ref 8.4–10.5)
Chloride: 103 mEq/L (ref 96–112)
Creatinine, Ser: 1.39 mg/dL — ABNORMAL HIGH (ref 0.50–1.35)
GFR calc Af Amer: 52 mL/min — ABNORMAL LOW (ref >90)
GFR calc non Af Amer: 45 mL/min — ABNORMAL LOW (ref >90)
Glucose, Bld: 177 mg/dL — ABNORMAL HIGH (ref 70–99)
Potassium: 3.9 mEq/L (ref 3.5–5.1)
Sodium: 139 mEq/L (ref 135–145)

## 2012-09-21 LAB — CK TOTAL AND CKMB (NOT AT ARMC)
CK, MB: 5.4 ng/mL — ABNORMAL HIGH (ref 0.3–4.0)
Relative Index: 2.4 (ref 0.0–2.5)

## 2012-09-21 MED ORDER — FUROSEMIDE 10 MG/ML IJ SOLN
40.0000 mg | Freq: Two times a day (BID) | INTRAMUSCULAR | Status: DC
  Administered 2012-09-21 – 2012-09-22 (×4): 40 mg via INTRAVENOUS
  Filled 2012-09-21 (×5): qty 4

## 2012-09-21 MED ORDER — ALLOPURINOL 100 MG PO TABS
100.0000 mg | ORAL_TABLET | Freq: Every morning | ORAL | Status: DC
  Administered 2012-09-21 – 2012-09-23 (×3): 100 mg via ORAL
  Filled 2012-09-21 (×3): qty 1

## 2012-09-21 MED ORDER — DIPHENHYDRAMINE HCL 50 MG PO CAPS
50.0000 mg | ORAL_CAPSULE | Freq: Once | ORAL | Status: DC
  Filled 2012-09-21: qty 1

## 2012-09-21 MED ORDER — TIOTROPIUM BROMIDE MONOHYDRATE 18 MCG IN CAPS
18.0000 ug | ORAL_CAPSULE | Freq: Every day | RESPIRATORY_TRACT | Status: DC
  Administered 2012-09-21 – 2012-09-23 (×3): 18 ug via RESPIRATORY_TRACT
  Filled 2012-09-21: qty 5

## 2012-09-21 NOTE — Evaluation (Signed)
Physical Therapy Evaluation Patient Details Name: Lucas Obrien MRN: [id number] DOB: 1924-07-28 Today's Date: 09/21/2012 Time: 5784-6962 PT Time Calculation (min): 24 min  PT Assessment / Plan / Recommendation Clinical Impression  pt presents with CHF, SOB, hx Dementia.  pt very motivated to increase mobility and per son had been getting HHPT since last admit.  Per son pt has been making progress with his mobility since working with HHPT.  Encouraged son and Nsg to continue to ambulate with pt with O2 to maintain mobility while back in hospital.      PT Assessment  Patient needs continued PT services    Follow Up Recommendations  Home health PT;Supervision - Intermittent    Does the patient have the potential to tolerate intense rehabilitation      Barriers to Discharge None      Equipment Recommendations  None recommended by PT    Recommendations for Other Services     Frequency Min 3X/week    Precautions / Restrictions Precautions Precautions: Fall Restrictions Weight Bearing Restrictions: No   Pertinent Vitals/Pain Denies pain.        Mobility  Bed Mobility Bed Mobility: Not assessed Transfers Transfers: Sit to Stand;Stand to Sit Sit to Stand: 5: Supervision;With upper extremity assist;From bed;From toilet Stand to Sit: 5: Supervision;With upper extremity assist;To toilet;To chair/3-in-1 Details for Transfer Assistance: cues for controlling descent and getting closer prior to sitting.   Ambulation/Gait Ambulation/Gait Assistance: 5: Supervision Ambulation Distance (Feet): 200 Feet Assistive device: None Ambulation/Gait Assistance Details: pt moves slowly, mild shuffle.  pt c/o minimal SOB ambulating without O2, however sats decreased from 90% at rest to 79% after ambulation on RA.  Strongly encouraged pt to wear O2 as he had taken it off prior to PT arrival.   Gait Pattern: Step-through pattern;Decreased stride length;Shuffle Stairs: No Wheelchair  Mobility Wheelchair Mobility: No    Exercises     PT Diagnosis: Difficulty walking  PT Problem List: Decreased activity tolerance;Decreased balance;Decreased mobility;Decreased cognition;Cardiopulmonary status limiting activity PT Treatment Interventions: DME instruction;Gait training;Stair training;Functional mobility training;Therapeutic activities;Therapeutic exercise;Balance training;Patient/family education   PT Goals Acute Rehab PT Goals PT Goal Formulation: With patient Time For Goal Achievement: 10/05/12 Potential to Achieve Goals: Good Pt will go Supine/Side to Sit: with modified independence PT Goal: Supine/Side to Sit - Progress: Goal set today Pt will go Sit to Supine/Side: with modified independence PT Goal: Sit to Supine/Side - Progress: Goal set today Pt will go Sit to Stand: with modified independence PT Goal: Sit to Stand - Progress: Goal set today Pt will go Stand to Sit: with modified independence PT Goal: Stand to Sit - Progress: Goal set today Pt will Ambulate: >150 feet;with modified independence PT Goal: Ambulate - Progress: Goal set today Pt will Go Up / Down Stairs: 3-5 stairs;with modified independence;with least restrictive assistive device PT Goal: Up/Down Stairs - Progress: Goal set today  Visit Information  Last PT Received On: 09/21/12 Assistance Needed: +1    Subjective Data  Subjective: Per son- He is finally starting to look better and more like himself.   Patient Stated Goal: Home   Prior Functioning  Home Living Lives With: Son;Family Available Help at Discharge: Family;Available 24 hours/day Type of Home: House Home Access: Stairs to enter [employer] of Steps: couple Home Layout: One level Home Adaptive Equipment: None Prior Function Level of Independence: Needs assistance Needs Assistance: Meal Prep;Light Housekeeping Meal Prep: Total Light Housekeeping: Total Able to Take Stairs?: Yes Driving: No Vocation:  Retired Comments:  Per son, pt only needs A due to memory deficts.   Communication Communication: No difficulties    Cognition       Extremity/Trunk Assessment Right Lower Extremity Assessment RLE ROM/Strength/Tone: WFL for tasks assessed RLE Sensation: WFL - Light Touch Left Lower Extremity Assessment LLE ROM/Strength/Tone: WFL for tasks assessed LLE Sensation: WFL - Light Touch Trunk Assessment Trunk Assessment: Kyphotic   Balance Balance Balance Assessed: Yes Static Standing Balance Static Standing - Balance Support: No upper extremity supported;During functional activity Static Standing - Level of Assistance: 5: Stand by assistance Static Standing - Comment/# of Minutes: pt able to perform gown adjustments instanding with only S.    End of Session PT - End of Session Equipment Utilized During Treatment: Gait belt Activity Tolerance: Patient tolerated treatment well Patient left: in chair;with call bell/phone within reach;with family/visitor present Nurse Communication: Mobility status  GP     Sunny Schlein, [place] [phone] 09/21/2012, 9:53 AM

## 2012-09-21 NOTE — Progress Notes (Signed)
CARDIAC REHAB PHASE I   PRE:  Rate/Rhythm: 64 SR    BP: sitting 140/60    SaO2: 99 2L  MODE:  Ambulation: 410 ft   POST:  Rate/Rhythm: 79 SR    BP: sitting 150/70     SaO2: 83 2L, 92 2L  Pt very agreeable. Used RW, 2L, assist x2 at first then x1 with gait belt. No c/o walking. Did not want to rest. Reminders to keep feet inside RW. On return SaO2 83 on 2L. Up to 90s with rest in recliner. Family present, left pt in recliner on 2L. [id number]   Elissa Lovett [place] CES, ACSM 09/21/2012 2:45 PM

## 2012-09-21 NOTE — Progress Notes (Addendum)
Told by son while I was in patients room that he does nectar thick liquids since speech therapy recommendation at discharge a few weeks ago, son will bring thickener, has two cans at home, posted sign from previous admission, changed diet order in computer to nectar thick. Berle Mull RN

## 2012-09-21 NOTE — Progress Notes (Signed)
Inpatient Diabetes Program Recommendations  AACE/ADA: New Consensus Statement on Inpatient Glycemic Control (2013)  Target Ranges:  Prepandial:   less than 140 mg/dL      Peak postprandial:   less than 180 mg/dL (1-2 hours)      Critically ill patients:  140 - 180 mg/dL   Results for KYRIAKOS, BABLER (MRN [id number]) as of 09/21/2012 09:45  Ref. Range 06/27/2012 09:49  Hemoglobin A1C Latest Range: 4.6-6.5 % 6.6 (H)   Results for SIMONE, TUCKEY (MRN [id number]) as of 09/21/2012 09:45  Ref. Range 09/20/2012 16:42 09/20/2012 21:17 09/21/2012 05:50  Glucose-Capillary Latest Range: 70-99 mg/dL 782 (H) 956 (H) 213 (H)   Inpatient Diabetes Program Recommendations Correction (SSI): Please consider ordering CBGs with Novolog sensitive correction ACHS.  Note: Patient has a history of diabetes but does not take any diabetic medication at home for diabetes management.  Patient's diabetes is diet controlled as an outpatient.  Currently, patient is not ordered to receive any medication for inpatient glycemic control.  Blood glucose over the past 14 hours has ranged from 161-212 mg/dl and fasting blood glucose this morning on labs was noted to be 161 mg/dl.  Please consider ordering CBGs ACHS with Novolog sensitive correction scale.  Will continue to follow.  Thanks, Orlando Penner, RN, BSN, CCRN Diabetes Coordinator Inpatient Diabetes Program [phone]

## 2012-09-21 NOTE — Progress Notes (Signed)
  Echocardiogram 2D Echocardiogram has been performed.  Lucas Obrien 09/21/2012, 9:54 AM

## 2012-09-21 NOTE — Progress Notes (Signed)
[facility] PROGRESS NOTE  Adel Burch RUE:[id number] DOB: 12-Nov-1924 DOA: 09/20/2012 PCP: Neena Rhymes, MD  Patient is 79 year old male with history of coronary artery disease, CABG, atrial fibrillation, hypertension, hyperlipidemia, BPH who was recently admitted last month for pneumonia presented to ED today with difficulty breathing. History was obtained from the patient and his family. Patient has been in his usual state of health state of health up to this morning when he started having difficulty breathing. Per patient's son, he noticed his father to be confused this morning and urinated on himself and breathing hard. Patient has a history of COPD on 2 L of oxygen all times. Patient has been feeling more comfortable on the recliner, has noticed orthopnea on laying flat and worsening pedal edema. He denied any chest pain.  He denied any fevers or chills but has been coughing and wheezing today.  ED workup showed elevated BNP of 7131, troponin less than 0.3. Chest x-ray showed pulmonary edema with cardiomegaly    Assessment/Plan: CHF, acute: No prior history of CHF per patient, EF unknown  - Admited to telemetry, serial cardiac enzymes wnl not suggestive of an acute MI.  , 2-D echocardiogram MR, dilated LA, diastolic dysfct  - Placed on Lasix 40 mg iv BID on admission, beta blocker, aspirin  - Hold ARB/HCTZ secondary to renal insufficiency  - Strict I.'s and O.'s and daily weights    Hyperlipidemia  - Continue statins   CAD (coronary artery disease):  Unclear history   HTN (hypertension)  - Stable, continue metoprolol, Norvasc and Lasix    DM (diabetes mellitus), type 2, uncontrolled, with renal complications: diet controlled   Dementia  - Continue Namenda and Aricept   Atrial fibrillation  - Discussed with patient's son in detail, he confirms that the patient has a history of atrial fibrillation, he is on aspirin     COPD  -resumed spiriva and symbicort     CKD (chronic kidney disease), stage II  - Monitor creatinine function closely with diuresis       Code Status: full  Family Communication: daughter  Disposition Plan: home   HPI/Subjective: Still with doe   Objective: Filed Vitals:   09/20/12 2141 09/20/12 2155 09/21/12 0229 09/21/12 0513  BP:  182/86  176/86  Pulse: 84 81 74 84  Temp:  97.5 F (36.4 C)  97.5 F (36.4 C)  TempSrc:  Oral  Oral  Resp: 22 20 20 18   Weight:    88.587 kg (195 lb 4.8 oz)  SpO2: 97% 96% 97% 93%   Patient Vitals for the past 24 hrs:  BP Temp Temp src Pulse Resp SpO2 Weight  09/21/12 0513 176/86 mmHg 97.5 F (36.4 C) Oral 84 18 93 % 88.587 kg (195 lb 4.8 oz)  09/21/12 0229 - - - 74 20 97 % -  09/20/12 2155 182/86 mmHg 97.5 F (36.4 C) Oral 81 20 96 % -  09/20/12 2141 - - - 84 22 97 % -  09/20/12 1729 154/78 mmHg 97.6 F (36.4 C) Oral 72 22 97 % 89.767 kg (197 lb 14.4 oz)  09/20/12 1545 155/69 mmHg - - 70 26 96 % -  09/20/12 1530 155/73 mmHg - - 66 29 97 % -  09/20/12 1500 143/72 mmHg - - 69 30 94 % -  09/20/12 1445 158/71 mmHg - - 54 25 94 % -  09/20/12 1430 150/62 mmHg - - 90 25 95 % -  09/20/12 1400 149/82 mmHg - - 69 28  93 % -  09/20/12 1345 143/69 mmHg - - 78 28 91 % -  09/20/12 1330 162/78 mmHg - - 54 24 94 % -  09/20/12 1300 122/56 mmHg - - 99 22 95 % -  09/20/12 1230 160/62 mmHg - - 106 22 97 % -  09/20/12 1215 150/65 mmHg - - 95 17 94 % -  09/20/12 1200 151/66 mmHg - - 108 22 93 % -  09/20/12 1145 151/69 mmHg - - 68 29 95 % -  09/20/12 1130 139/62 mmHg - - 56 28 94 % -  09/20/12 1115 161/89 mmHg - - 101 26 94 % -  09/20/12 0905 142/63 mmHg 99.2 F (37.3 C) Oral 74 22 90 % -     Intake/Output Summary (Last 24 hours) at 09/21/12 0741 Last data filed at 09/21/12 0515  Gross per 24 hour  Intake    480 ml  Output   2650 ml  Net  -2170 ml   Filed Weights   09/20/12 1729 09/21/12 0513  Weight: 89.767 kg (197 lb 14.4 oz) 88.587 kg (195 lb 4.8 oz)     Exam:   General:  Alert   Cardiovascular: irreg irreg   Respiratory: bilat wheezes and crackles   Abdomen: soft, nt   Musculoskeletal: positive le edema   Data Reviewed: Basic Metabolic Panel:  Recent Labs Lab 09/20/12 0926 09/20/12 1746 09/21/12 0431  NA 141  --  139  K 3.7  --  3.9  CL 108  --  103  CO2 25  --  27  GLUCOSE 167*  --  177*  BUN 21  --  23  CREATININE 1.50* 1.40* 1.39*  CALCIUM 9.0  --  9.4   Liver Function Tests:  Recent Labs Lab 09/20/12 0926  AST 17  ALT 11  ALKPHOS 65  BILITOT 0.3  PROT 7.0  ALBUMIN 3.1*   No results found for this basename: LIPASE, AMYLASE,  in the last 168 hours No results found for this basename: AMMONIA,  in the last 168 hours CBC:  Recent Labs Lab 09/20/12 0926 09/20/12 1746  WBC 7.5 5.5  NEUTROABS 5.1  --   HGB 10.8* 11.5*  HCT 32.0* 34.4*  MCV 92.2 91.0  PLT 136* 148*   Cardiac Enzymes:  Recent Labs Lab 09/20/12 1325 09/20/12 1746 09/20/12 2157 09/21/12 0431  CKTOTAL  --  306* 281* 227  CKMB  --  5.1* 5.6* 5.4*  TROPONINI <0.30 <0.30 <0.30 <0.30   BNP (last 3 results)  Recent Labs  09/20/12 1325  PROBNP 7131.0*   CBG:  Recent Labs Lab 09/20/12 1642 09/20/12 2117 09/21/12 0550  GLUCAP 207* 212* 161*    No results found for this or any previous visit (from the past 240 hour(s)).   Studies: Dg Chest Port 1 View  09/20/2012  *RADIOLOGY REPORT*  Clinical Data: Shortness of breath and cough.  PORTABLE CHEST - 1 VIEW  Comparison: PA and lateral chest 08/21/2012 and 02/13/2012.  Findings: There is cardiomegaly with bilateral alveolar and interstitial opacities.  Small right effusion is noted.  No pneumothorax.  The patient is status post median sternotomy.  IMPRESSION: Cardiomegaly and pulmonary edema.  Small right effusion noted.   Original Report Authenticated By: Holley Dexter, M.D.     Scheduled Meds: . amLODipine  5 mg Oral q morning - 10a  . aspirin  325 mg Oral q morning  - 10a  . budesonide-formoterol  2 puff Inhalation BID  . diphenhydrAMINE  50 mg Oral Once  . donepezil  5 mg Oral QHS  . enoxaparin (LOVENOX) injection  30 mg Subcutaneous Q24H  . furosemide  40 mg Intravenous Daily  . guaiFENesin  600 mg Oral Daily  . ipratropium  1 spray Nasal TID  . levalbuterol  0.63 mg Nebulization Q6H   And  . ipratropium  0.5 mg Nebulization Q6H  . loratadine  10 mg Oral Daily  . memantine  10 mg Oral BID  . metoprolol  100 mg Oral BID  . simvastatin  10 mg Oral QHS  . sodium chloride  3 mL Intravenous Q12H  . tamsulosin  0.4 mg Oral Daily  . thiamine  100 mg Oral Daily   Continuous Infusions:   Principal Problem:   CHF, acute Active Problems:   Hyperlipidemia   CAD (coronary artery disease)   HTN (hypertension)   DM (diabetes mellitus), type 2, uncontrolled, with renal complications, diet controlled   Dementia   Atrial fibrillation   Bronchitis, acute   CKD (chronic kidney disease), stage II   COPD (chronic obstructive pulmonary disease)     Aaryana Betke  [facility] Pager [phone]. If 7PM-7AM, please contact night-coverage at [email or web address], password [facility] 09/21/2012, 7:41 AM  LOS: 1 day

## 2012-09-22 DIAGNOSIS — J690 Pneumonitis due to inhalation of food and vomit: Secondary | ICD-10-CM

## 2012-09-22 DIAGNOSIS — R4182 Altered mental status, unspecified: Secondary | ICD-10-CM

## 2012-09-22 LAB — GLUCOSE, CAPILLARY
Glucose-Capillary: 172 mg/dL — ABNORMAL HIGH (ref 70–99)
Glucose-Capillary: 183 mg/dL — ABNORMAL HIGH (ref 70–99)

## 2012-09-22 LAB — BASIC METABOLIC PANEL
BUN: 34 mg/dL — ABNORMAL HIGH (ref 6–23)
CO2: 31 mEq/L (ref 19–32)
Chloride: 99 mEq/L (ref 96–112)
Glucose, Bld: 153 mg/dL — ABNORMAL HIGH (ref 70–99)
Potassium: 3.2 mEq/L — ABNORMAL LOW (ref 3.5–5.1)

## 2012-09-22 MED ORDER — POTASSIUM CHLORIDE CRYS ER 20 MEQ PO TBCR
40.0000 meq | EXTENDED_RELEASE_TABLET | Freq: Once | ORAL | Status: AC
  Administered 2012-09-22: 40 meq via ORAL
  Filled 2012-09-22: qty 1

## 2012-09-22 MED ORDER — IRBESARTAN 150 MG PO TABS
150.0000 mg | ORAL_TABLET | Freq: Every day | ORAL | Status: DC
  Administered 2012-09-22 – 2012-09-23 (×2): 150 mg via ORAL
  Filled 2012-09-22 (×2): qty 1

## 2012-09-22 NOTE — Progress Notes (Signed)
[facility] PROGRESS NOTE  Lucas Obrien ZOX:[id number] DOB: Aug 01, 1924 DOA: 09/20/2012 PCP: Neena Rhymes, MD  Patient is 79 year old male with history of coronary artery disease, CABG, atrial fibrillation, hypertension, hyperlipidemia, BPH who was recently admitted last month for pneumonia presented to ED today with difficulty breathing. History was obtained from the patient and his family. Patient has been in his usual state of health state of health up to this morning when he started having difficulty breathing. Per patient's son, he noticed his father to be confused this morning and urinated on himself and breathing hard. Patient has a history of COPD on 2 L of oxygen all times. Patient has been feeling more comfortable on the recliner, has noticed orthopnea on laying flat and worsening pedal edema. He denied any chest pain.  He denied any fevers or chills but has been coughing and wheezing today.  ED workup showed elevated BNP of 7131, troponin less than 0.3. Chest x-ray showed pulmonary edema with cardiomegaly    Assessment/Plan: CHF, acute: acute diastolic vs from mitral regurgitation  - Admited to telemetry, serial cardiac enzymes wnl not suggestive of an acute MI.  , 2-D echocardiogram MR, dilated LA, diastolic dysfct  - Placed on Lasix 40 mg iv BID on admission, beta blocker, aspirin  - Hold ARB/HCTZ secondary to renal insufficiency  - Strict I.'s and O.'s and daily weights    Hyperlipidemia  - Continue statins   CAD (coronary artery disease):  Unclear history   HTN (hypertension)  - Stable, continue metoprolol, Norvasc and Lasix    DM (diabetes mellitus), type 2, uncontrolled, with renal complications: diet controlled   Dementia  - Continue Namenda and Aricept   Atrial fibrillation  - Discussed with patient's son in detail, he confirms that the patient has a history of atrial fibrillation, he is on aspirin     COPD  -resumed spiriva and symbicort    CKD  (chronic kidney disease), stage II  - Monitor creatinine function closely with diuresis       Code Status: full  Family Communication: daughter  Disposition Plan: home   HPI/Subjective: Much better , able to ambulate without severe dyspnea   Objective: Filed Vitals:   09/22/12 0500 09/22/12 0850 09/22/12 1038 09/22/12 1303  BP: 178/70  129/62   Pulse: 63  60   Temp: 97.8 F (36.6 C)     TempSrc: Oral     Resp: 18     Height:      Weight: 85.458 kg (188 lb 6.4 oz)     SpO2: 98% 98%  92%   Patient Vitals for the past 24 hrs:  BP Temp Temp src Pulse Resp SpO2 Weight  09/22/12 1303 - - - - - 92 % -  09/22/12 1038 129/62 mmHg - - 60 - - -  09/22/12 0850 - - - - - 98 % -  09/22/12 0500 178/70 mmHg 97.8 F (36.6 C) Oral 63 18 98 % 85.458 kg (188 lb 6.4 oz)  09/21/12 2059 167/61 mmHg 97.8 F (36.6 C) Oral 69 18 99 % -  09/21/12 2040 - - - - - 98 % -     Intake/Output Summary (Last 24 hours) at 09/22/12 1432 Last data filed at 09/22/12 0838  Gross per 24 hour  Intake    963 ml  Output    925 ml  Net     38 ml   Filed Weights   09/20/12 1729 09/21/12 0513 09/22/12 0500  Weight: 89.767 kg (  197 lb 14.4 oz) 88.587 kg (195 lb 4.8 oz) 85.458 kg (188 lb 6.4 oz)    Exam:   General:  Alert   Cardiovascular: irreg irreg   Respiratory: no further wheezing, or crackles   Abdomen: soft, nt   Musculoskeletal: positive le edema   Data Reviewed: Basic Metabolic Panel:  Recent Labs Lab 09/20/12 0926 09/20/12 1746 09/21/12 0431 09/22/12 0435  NA 141  --  139 139  K 3.7  --  3.9 3.2*  CL 108  --  103 99  CO2 25  --  27 31  GLUCOSE 167*  --  177* 153*  BUN 21  --  23 34*  CREATININE 1.50* 1.40* 1.39* 1.47*  CALCIUM 9.0  --  9.4 9.2   Liver Function Tests:  Recent Labs Lab 09/20/12 0926  AST 17  ALT 11  ALKPHOS 65  BILITOT 0.3  PROT 7.0  ALBUMIN 3.1*   No results found for this basename: LIPASE, AMYLASE,  in the last 168 hours No results found for  this basename: AMMONIA,  in the last 168 hours CBC:  Recent Labs Lab 09/20/12 0926 09/20/12 1746  WBC 7.5 5.5  NEUTROABS 5.1  --   HGB 10.8* 11.5*  HCT 32.0* 34.4*  MCV 92.2 91.0  PLT 136* 148*   Cardiac Enzymes:  Recent Labs Lab 09/20/12 1325 09/20/12 1746 09/20/12 2157 09/21/12 0431  CKTOTAL  --  306* 281* 227  CKMB  --  5.1* 5.6* 5.4*  TROPONINI <0.30 <0.30 <0.30 <0.30   BNP (last 3 results)  Recent Labs  09/20/12 1325  PROBNP 7131.0*   CBG:  Recent Labs Lab 09/21/12 0550 09/21/12 1110 09/21/12 1620 09/21/12 2257 09/22/12 1135  GLUCAP 161* 168* 190* 131* 172*    No results found for this or any previous visit (from the past 240 hour(s)).   Studies: No results found.  Scheduled Meds: . allopurinol  100 mg Oral q morning - 10a  . amLODipine  5 mg Oral q morning - 10a  . aspirin  325 mg Oral q morning - 10a  . budesonide-formoterol  2 puff Inhalation BID  . diphenhydrAMINE  50 mg Oral Once  . donepezil  5 mg Oral QHS  . enoxaparin (LOVENOX) injection  30 mg Subcutaneous Q24H  . furosemide  40 mg Intravenous BID  . guaiFENesin  600 mg Oral Daily  . ipratropium  1 spray Nasal TID  . irbesartan  150 mg Oral Daily  . loratadine  10 mg Oral Daily  . memantine  10 mg Oral BID  . metoprolol  100 mg Oral BID  . simvastatin  10 mg Oral QHS  . sodium chloride  3 mL Intravenous Q12H  . tamsulosin  0.4 mg Oral Daily  . thiamine  100 mg Oral Daily  . tiotropium  18 mcg Inhalation Daily   Continuous Infusions:   Principal Problem:   CHF, acute Active Problems:   Hyperlipidemia   CAD (coronary artery disease)   HTN (hypertension)   DM (diabetes mellitus), type 2, uncontrolled, with renal complications, diet controlled   Dementia   Atrial fibrillation   Bronchitis, acute   CKD (chronic kidney disease), stage II   COPD (chronic obstructive pulmonary disease)     Greggory Safranek  [facility] Pager [phone]. If 7PM-7AM, please contact  night-coverage at [email or web address], password [facility] 09/22/2012, 2:32 PM  LOS: 2 days

## 2012-09-22 NOTE — Progress Notes (Signed)
09/22/12 1430 In to speak with pt. and daughter, Santina Evans about home health sevices.  Pt. is currently being seen by [facility] for [facility] RN, [facility] PT/OT.  Pt has home continuous oxygen, walker, and cane at home.  Pt. is able to obtain all medications without difficulty.  Pt. is currently on IV lasix, and will need 1-2 more days of inpatient care.  TC to Debbie, with [facility], to give resumption of care orders.  NCM will follow. Tera Mater, RN, BSN NCM [phone]

## 2012-09-22 NOTE — Progress Notes (Signed)
Physical Therapy Treatment Patient Details Name: Lucas Obrien MRN: [id number] DOB: 1924/11/26 Today's Date: 09/22/2012 Time: 7829-5621 PT Time Calculation (min): 48 min  PT Assessment / Plan / Recommendation Comments on Treatment Session  Pt is making excellent progress with mobility and activity tolerance. Pt reports he may d/c home tomorrow with his son assisting as needed. Agree with d/c plan. Pt ambulated 200 feet with o2 sats in the low 90s.    Follow Up Recommendations  Home health PT     Does the patient have the potential to tolerate intense rehabilitation     Barriers to Discharge        Equipment Recommendations       Recommendations for Other Services    Frequency     Plan Discharge plan remains appropriate    Precautions / Restrictions     Pertinent Vitals/Pain     Mobility  Bed Mobility Bed Mobility: Sit to Supine Sit to Supine: 6: Modified independent (Device/Increase time) Transfers Transfers: Sit to Stand Sit to Stand: 4: Min assist;From chair/3-in-1 Stand to Sit: 5: Supervision Details for Transfer Assistance: sit to stand cues to increase forward translation to assist with rise. Ambulation/Gait Ambulation/Gait Assistance: 5: Supervision Ambulation Distance (Feet): 320 Feet Assistive device: Rolling walker Ambulation/Gait Assistance Details: cues to increase stride length, ambulation with O2, denied SOB with ambulation Gait Pattern: Decreased stride length;Step-through pattern Gait velocity: slightly decreased Stairs: No    Exercises     PT Diagnosis:    PT Problem List:   PT Treatment Interventions:     PT Goals Acute Rehab PT Goals PT Goal: Sit to Supine/Side - Progress: Met PT Goal: Sit to Stand - Progress: Progressing toward goal PT Goal: Stand to Sit - Progress: Progressing toward goal PT Goal: Ambulate - Progress: Progressing toward goal  Visit Information  Last PT Received On: 09/22/12 Assistance Needed: +1    Subjective Data  Subjective: He is ready to go home tomorrow.   Cognition  Cognition Arousal/Alertness: Awake/alert Behavior During Therapy: WFL for tasks assessed/performed Overall Cognitive Status: Within Functional Limits for tasks assessed    Balance  Balance Balance Assessed: Yes Static Standing Balance Static Standing - Level of Assistance: 5: Stand by assistance  End of Session PT - End of Session Equipment Utilized During Treatment: Gait belt;Oxygen Activity Tolerance: Patient tolerated treatment well Patient left: in bed;with family/visitor present;with call bell/phone within reach Nurse Communication: Mobility status   GP     Lucas Obrien 09/22/2012, 1:09 PM

## 2012-09-22 NOTE — Progress Notes (Signed)
Utilization Review Completed.   Nikash Mortensen, RN, BSN Nurse Case Manager  [phone]  

## 2012-09-22 NOTE — Progress Notes (Signed)
Pt  Requesting to talk with his son [name], called and pt  was delighted and thankful.  Amanda Pea, Charity fundraiser.

## 2012-09-23 ENCOUNTER — Other Ambulatory Visit: Payer: Self-pay | Admitting: Family Medicine

## 2012-09-23 LAB — BASIC METABOLIC PANEL
BUN: 42 mg/dL — ABNORMAL HIGH (ref 6–23)
Chloride: 98 mEq/L (ref 96–112)
Creatinine, Ser: 1.75 mg/dL — ABNORMAL HIGH (ref 0.50–1.35)
GFR calc Af Amer: 39 mL/min — ABNORMAL LOW (ref >90)
GFR calc non Af Amer: 34 mL/min — ABNORMAL LOW (ref >90)
Potassium: 3.5 mEq/L (ref 3.5–5.1)

## 2012-09-23 LAB — GLUCOSE, CAPILLARY: Glucose-Capillary: 133 mg/dL — ABNORMAL HIGH (ref 70–99)

## 2012-09-23 MED ORDER — FUROSEMIDE 40 MG PO TABS: 40.0000 mg | ORAL_TABLET | Freq: Every day | ORAL | Status: DC

## 2012-09-23 MED ORDER — FUROSEMIDE 40 MG PO TABS
40.0000 mg | ORAL_TABLET | Freq: Two times a day (BID) | ORAL | Status: DC
  Administered 2012-09-23: 40 mg via ORAL
  Filled 2012-09-23 (×3): qty 1

## 2012-09-23 NOTE — Discharge Summary (Signed)
Physician Discharge Summary  Lucas Obrien UJW:[id number] DOB: 1924/12/12 DOA: 09/20/2012  PCP: Neena Rhymes, MD  Admit date: 09/20/2012 Discharge date: 09/23/2012  Time spent: 35 minutes  Recommendations for Outpatient Follow-up:  Basic metabolic profile  Discharge Diagnoses:  Diastolic CHF, acute   Hyperlipidemia   CAD (coronary artery disease)   HTN (hypertension)   DM (diabetes mellitus), type 2, uncontrolled, with renal complications, diet controlled   Dementia   Atrial fibrillation   Bronchitis, acute   CKD (chronic kidney disease), stage II   COPD (chronic obstructive pulmonary disease) Chronic respiratory failure on home oxygen  Discharge Condition: Good  Diet recommendation: Heart healthy  Filed Weights   09/21/12 0513 09/22/12 0500 09/23/12 0519  Weight: 88.587 kg (195 lb 4.8 oz) 85.458 kg (188 lb 6.4 oz) 84.7 kg (186 lb 11.7 oz)    History of present illness:  Patient is 79 year old male with history of coronary artery disease, CABG, atrial fibrillation, hypertension, hyperlipidemia, BPH who was recently admitted last month for pneumonia presented to ED today with difficulty breathing. History was obtained from the patient and his family. Patient has been in his usual state of health state of health up to this morning when he started having difficulty breathing. Per patient's son, he noticed his father to be confused this morning and urinated on himself and breathing hard. Patient has a history of COPD on 2 L of oxygen all times. Patient has been feeling more comfortable on the recliner, has noticed orthopnea on laying flat and worsening pedal edema. He denied any chest pain.  He denied any fevers or chills but has been coughing and wheezing today.  ED workup showed elevated BNP of 7131, troponin less than 0.3. Chest x-ray showed pulmonary edema with cardiomegaly      Hospital Course:  CHF, acute: acute diastolic from hypertensive cardiomyopathy vs from mitral  regurgitation  - Admited to telemetry, serial cardiac enzymes wnl not suggestive of an acute MI.  , 2-D echocardiogram MR, dilated LA, diastolic dysfct  - Placed on Lasix 40 mg iv BID on admission, beta blocker, aspirin  - Strict I.'s and O.'s and daily weights  Patient diary successfully a total of 3792 mL negative balance. He felt remarkably better with increased endurance and tolerance to effort. We did educate patient and his family about the importance of salt restriction fluid restriction and compliance with home medication regimen. Hyperlipidemia  - Continued statin  HTN (hypertension)  - Stable, will continue metoprolol, Norvasc and Lasix   DM (diabetes mellitus), type 2, uncontrolled, with renal complications: diet controlled  Dementia  - Continued Namenda and Aricept  Atrial fibrillation  - Discussed with patient's son in detail, he confirms that the patient has a history of atrial fibrillation, he is on aspirin for stroke prevention. Patient ambulates with a walker and he is at high risk for falling-we have elected not to pursue full anticoagulation due to risk of hemorrhage  COPD  -resumed spiriva and symbicort   CKD (chronic kidney disease), stage II  - Renal function remained stable during diuresis      Procedures:  Transthoracic echocardiogram (i.e. Studies not automatically included, echos, thoracentesis, etc; not x-rays)  Consultations:  None  Discharge Exam: Filed Vitals:   09/22/12 2057 09/22/12 2108 09/23/12 0519 09/23/12 0932  BP: 149/71  144/68 114/60  Pulse: 60 62 57 64  Temp: 98.3 F (36.8 C)  97.4 F (36.3 C)   TempSrc: Oral  Oral   Resp: 20 20 20  Height:      Weight:   84.7 kg (186 lb 11.7 oz)   SpO2: 98% 98% 98%     General: Alert Cardiovascular: Irregular, no murmurs Respiratory: Minimal crackles minimal wheezes  Discharge Instructions  Discharge Orders   Future Appointments Provider Department Dept Phone   10/11/2012 1:15 PM  Sheliah Hatch, MD [facility] at  [place] [phone]   Future Orders Complete By Expires     (HEART FAILURE PATIENTS) Call MD:  Anytime you have any of the following symptoms: 1) 3 pound weight gain in 24 hours or 5 pounds in 1 week 2) shortness of breath, with or without a dry hacking cough 3) swelling in the hands, feet or stomach 4) if you have to sleep on extra pillows at night in order to breathe.  As directed     Diet - low sodium heart healthy  As directed     Increase activity slowly  As directed         Medication List    STOP taking these medications       guaiFENesin 600 MG 12 hr tablet  Commonly known as:  MUCINEX      TAKE these medications       allopurinol 300 MG tablet  Commonly known as:  ZYLOPRIM  Take 1 tablet (300 mg total) by mouth every morning.     amLODipine 5 MG tablet  Commonly known as:  NORVASC  Take 1 tablet (5 mg total) by mouth every morning.     aspirin 325 MG tablet  Take 325 mg by mouth every morning.     budesonide-formoterol 160-4.5 MCG/ACT inhaler  Commonly known as:  SYMBICORT  Inhale 2 puffs into the lungs 2 (two) times daily.     cetirizine 10 MG tablet  Commonly known as:  ZYRTEC  Take 10 mg by mouth daily.     donepezil 5 MG tablet  Commonly known as:  ARICEPT  Take 1 tablet (5 mg total) by mouth at bedtime.     furosemide 40 MG tablet  Commonly known as:  LASIX  Take 1 tablet (40 mg total) by mouth daily. Weigh daily.   If you gain more than 2 pounds take 2 tablets that morning     ipratropium 0.06 % nasal spray  Commonly known as:  ATROVENT  Place 1 spray into the nose 3 (three) times daily.     meclizine 25 MG tablet  Commonly known as:  ANTIVERT  Take 25 mg by mouth daily as needed for dizziness.     memantine 10 MG tablet  Commonly known as:  NAMENDA  Take 10 mg by mouth 2 (two) times daily.     metoprolol 100 MG tablet  Commonly known as:  LOPRESSOR  Take 1 tablet (100 mg total) by  mouth 2 (two) times daily.     simvastatin 10 MG tablet  Commonly known as:  ZOCOR  Take 1 tablet (10 mg total) by mouth at bedtime.     SPIRIVA HANDIHALER 18 MCG inhalation capsule  Generic drug:  tiotropium  inhale contents of 1 capsule by mouth once daily     tamsulosin 0.4 MG Caps  Commonly known as:  FLOMAX  Take 1 capsule (0.4 mg total) by mouth daily.     thiamine 100 MG tablet  Commonly known as:  VITAMIN B-1  Take 100 mg by mouth daily.     valsartan-hydrochlorothiazide 320-25 MG per tablet  Commonly known as:  DIOVAN-HCT  Take 1 tablet by mouth daily.           Follow-up Information   Follow up with [facility]. [facility] Nurse and PT/OT)    Contact information:   [phone]      Schedule an appointment as soon as possible for a visit with Neena Rhymes, MD.   Contact information:   [phone] [place] [address] [phone]        The results of significant diagnostics from this hospitalization (including imaging, microbiology, ancillary and laboratory) are listed below for reference.    Significant Diagnostic Studies: Dg Chest Port 1 View  09/20/2012  *RADIOLOGY REPORT*  Clinical Data: Shortness of breath and cough.  PORTABLE CHEST - 1 VIEW  Comparison: PA and lateral chest 08/21/2012 and 02/13/2012.  Findings: There is cardiomegaly with bilateral alveolar and interstitial opacities.  Small right effusion is noted.  No pneumothorax.  The patient is status post median sternotomy.  IMPRESSION: Cardiomegaly and pulmonary edema.  Small right effusion noted.   Original Report Authenticated By: Holley Dexter, M.D.     Microbiology: No results found for this or any previous visit (from the past 240 hour(s)).   Labs: Basic Metabolic Panel:  Recent Labs Lab 09/20/12 0926 09/20/12 1746 09/21/12 0431 09/22/12 0435 09/23/12 0630  NA 141  --  139 139 140  K 3.7  --  3.9 3.2* 3.5  CL 108  --  103 99 98  CO2 25  --  27 31 34*   GLUCOSE 167*  --  177* 153* 136*  BUN 21  --  23 34* 42*  CREATININE 1.50* 1.40* 1.39* 1.47* 1.75*  CALCIUM 9.0  --  9.4 9.2 8.9   Liver Function Tests:  Recent Labs Lab 09/20/12 0926  AST 17  ALT 11  ALKPHOS 65  BILITOT 0.3  PROT 7.0  ALBUMIN 3.1*   No results found for this basename: LIPASE, AMYLASE,  in the last 168 hours No results found for this basename: AMMONIA,  in the last 168 hours CBC:  Recent Labs Lab 09/20/12 0926 09/20/12 1746  WBC 7.5 5.5  NEUTROABS 5.1  --   HGB 10.8* 11.5*  HCT 32.0* 34.4*  MCV 92.2 91.0  PLT 136* 148*   Cardiac Enzymes:  Recent Labs Lab 09/20/12 1325 09/20/12 1746 09/20/12 2157 09/21/12 0431  CKTOTAL  --  306* 281* 227  CKMB  --  5.1* 5.6* 5.4*  TROPONINI <0.30 <0.30 <0.30 <0.30   BNP: BNP (last 3 results)  Recent Labs  09/20/12 1325  PROBNP 7131.0*   CBG:  Recent Labs Lab 09/21/12 1620 09/21/12 2257 09/22/12 1135 09/22/12 1614 09/23/12 0603  GLUCAP 190* 131* 172* 183* 133*       Signed:  Rylen Hou  [facility] 09/23/2012, 10:00 AM

## 2012-09-25 ENCOUNTER — Ambulatory Visit: Payer: Medicare Other | Admitting: Family Medicine

## 2012-09-25 NOTE — Telephone Encounter (Signed)
Med filled.  

## 2012-09-26 NOTE — ED Provider Notes (Signed)
History     CSN: [id number]  Arrival date & time 09/20/12  4098   First MD Initiated Contact with Patient 09/20/12 [phone]      Chief Complaint  Patient presents with  . Shortness of Breath    (Consider location/radiation/quality/duration/timing/severity/associated sxs/prior treatment) Patient is a 79 y.o. male presenting with shortness of breath. The history is provided by the patient (pt complains of sob). No language interpreter was used.  Shortness of Breath Severity:  Moderate Onset quality:  Gradual Timing:  Constant Progression:  Worsening Chronicity:  Recurrent Context: activity   Associated symptoms: no abdominal pain, no chest pain, no cough, no headaches and no rash     Past Medical History  Diagnosis Date  . Heart disease   . Hypertension   . Hyperlipidemia   . Urine incontinence   . Alcohol problem drinking   . Emphysema of lung   . Type II diabetes mellitus   . Chronic kidney disease     "moderate"  . Arthritis     "all over"  . Gout   . Dementia   . UTI (lower urinary tract infection) 12/08/2011    "first one"  . Shortness of breath   . Pneumonia 08/21/2012  . Coronary artery disease   . Dysrhythmia     Past Surgical History  Procedure Laterality Date  . Coronary artery bypass graft  1994    Family History  Problem Relation Age of Onset  . Arthritis Mother   . Cancer Father   . Hyperlipidemia Father   . Heart disease Father   . Hypertension Father   . Diabetes Father   . Stroke Sister   . Stroke Brother     History  Substance Use Topics  . Smoking status: Former Smoker -- 0.50 packs/day for 67 years    Types: Cigarettes    Quit date: 05/31/2008  . Smokeless tobacco: Never Used  . Alcohol Use: Yes     Comment: 12/08/11 "last alcohol was ~ 20 yr ago"      Review of Systems  Constitutional: Negative for appetite change and fatigue.  HENT: Negative for congestion, sinus pressure and ear discharge.   Eyes: Negative for discharge.   Respiratory: Positive for shortness of breath. Negative for cough.   Cardiovascular: Negative for chest pain.  Gastrointestinal: Negative for abdominal pain and diarrhea.  Genitourinary: Negative for frequency and hematuria.  Musculoskeletal: Negative for back pain.  Skin: Negative for rash.  Neurological: Negative for seizures and headaches.  Psychiatric/Behavioral: Negative for hallucinations.    Allergies  Bee venom and Penicillins  Home Medications   Current Outpatient Rx  Name  Route  Sig  Dispense  Refill  . allopurinol (ZYLOPRIM) 300 MG tablet   Oral   Take 1 tablet (300 mg total) by mouth every morning.   30 tablet   6   . amLODipine (NORVASC) 5 MG tablet   Oral   Take 1 tablet (5 mg total) by mouth every morning.   30 tablet   6   . aspirin 325 MG tablet   Oral   Take 325 mg by mouth every morning.          . budesonide-formoterol (SYMBICORT) 160-4.5 MCG/ACT inhaler   Inhalation   Inhale 2 puffs into the lungs 2 (two) times daily.   1 Inhaler   2   . cetirizine (ZYRTEC) 10 MG tablet   Oral   Take 10 mg by mouth daily.         Marland Kitchen  donepezil (ARICEPT) 5 MG tablet   Oral   Take 1 tablet (5 mg total) by mouth at bedtime.   30 tablet   2   . ipratropium (ATROVENT) 0.06 % nasal spray   Nasal   Place 1 spray into the nose 3 (three) times daily.   15 mL   6   . meclizine (ANTIVERT) 25 MG tablet   Oral   Take 25 mg by mouth daily as needed for dizziness.         . memantine (NAMENDA) 10 MG tablet   Oral   Take 10 mg by mouth 2 (two) times daily.           . metoprolol (LOPRESSOR) 100 MG tablet   Oral   Take 1 tablet (100 mg total) by mouth 2 (two) times daily.   60 tablet   5   . SPIRIVA HANDIHALER 18 MCG inhalation capsule      inhale contents of 1 capsule by mouth once daily   30 capsule   6   . tamsulosin (FLOMAX) 0.4 MG CAPS   Oral   Take 1 capsule (0.4 mg total) by mouth daily.   30 capsule   5   . thiamine (VITAMIN B-1)  100 MG tablet   Oral   Take 100 mg by mouth daily.         . valsartan-hydrochlorothiazide (DIOVAN-HCT) 320-25 MG per tablet   Oral   Take 1 tablet by mouth daily.   30 tablet   5   . furosemide (LASIX) 40 MG tablet   Oral   Take 1 tablet (40 mg total) by mouth daily. Weigh daily.  If you gain more than 2 pounds take 2 tablets that morning   30 tablet   0   . simvastatin (ZOCOR) 10 MG tablet      take 1 tablet by mouth at bedtime   90 tablet   1     BP 114/60  Pulse 64  Temp(Src) 97.4 F (36.3 C) (Oral)  Resp 20  Ht 5\' 5"  (1.651 m)  Wt 186 lb 11.7 oz (84.7 kg)  BMI 31.07 kg/m2  SpO2 96%  Physical Exam  Constitutional: He is oriented to person, place, and time. He appears well-developed.  HENT:  Head: Normocephalic.  Eyes: Conjunctivae and EOM are normal. No scleral icterus.  Neck: Neck supple. No thyromegaly present.  Cardiovascular: Normal rate.  Exam reveals no gallop and no friction rub.   No murmur heard. Irregular rhythm  Pulmonary/Chest: No stridor. He has no wheezes. He has rales. He exhibits no tenderness.  Abdominal: He exhibits no distension. There is no tenderness. There is no rebound.  Musculoskeletal: Normal range of motion. He exhibits no edema.  Lymphadenopathy:    He has no cervical adenopathy.  Neurological: He is oriented to person, place, and time. Coordination normal.  Skin: No rash noted. No erythema.  Psychiatric: He has a normal mood and affect. His behavior is normal.    ED Course  Procedures (including critical care time)  Labs Reviewed  CBC WITH DIFFERENTIAL - Abnormal; Notable for the following:    RBC 3.47 (*)    Hemoglobin 10.8 (*)    HCT 32.0 (*)    Platelets 136 (*)    All other components within normal limits  COMPREHENSIVE METABOLIC PANEL - Abnormal; Notable for the following:    Glucose, Bld 167 (*)    Creatinine, Ser 1.50 (*)    Albumin 3.1 (*)  GFR calc non Af Amer 41 (*)    GFR calc Af Amer 47 (*)    All  other components within normal limits  PRO B NATRIURETIC PEPTIDE - Abnormal; Notable for the following:    Pro B Natriuretic peptide (BNP) 7131.0 (*)    All other components within normal limits  CBC - Abnormal; Notable for the following:    RBC 3.78 (*)    Hemoglobin 11.5 (*)    HCT 34.4 (*)    Platelets 148 (*)    All other components within normal limits  CREATININE, SERUM - Abnormal; Notable for the following:    Creatinine, Ser 1.40 (*)    GFR calc non Af Amer 44 (*)    GFR calc Af Amer 51 (*)    All other components within normal limits  CK TOTAL AND CKMB - Abnormal; Notable for the following:    Total CK 306 (*)    CK, MB 5.1 (*)    All other components within normal limits  GLUCOSE, CAPILLARY - Abnormal; Notable for the following:    Glucose-Capillary 207 (*)    All other components within normal limits  GLUCOSE, CAPILLARY - Abnormal; Notable for the following:    Glucose-Capillary 212 (*)    All other components within normal limits  CK TOTAL AND CKMB - Abnormal; Notable for the following:    Total CK 281 (*)    CK, MB 5.6 (*)    All other components within normal limits  CK TOTAL AND CKMB - Abnormal; Notable for the following:    CK, MB 5.4 (*)    All other components within normal limits  BASIC METABOLIC PANEL - Abnormal; Notable for the following:    Glucose, Bld 177 (*)    Creatinine, Ser 1.39 (*)    GFR calc non Af Amer 45 (*)    GFR calc Af Amer 52 (*)    All other components within normal limits  GLUCOSE, CAPILLARY - Abnormal; Notable for the following:    Glucose-Capillary 161 (*)    All other components within normal limits  GLUCOSE, CAPILLARY - Abnormal; Notable for the following:    Glucose-Capillary 168 (*)    All other components within normal limits  GLUCOSE, CAPILLARY - Abnormal; Notable for the following:    Glucose-Capillary 190 (*)    All other components within normal limits  BASIC METABOLIC PANEL - Abnormal; Notable for the following:     Potassium 3.2 (*)    Glucose, Bld 153 (*)    BUN 34 (*)    Creatinine, Ser 1.47 (*)    GFR calc non Af Amer 42 (*)    GFR calc Af Amer 48 (*)    All other components within normal limits  GLUCOSE, CAPILLARY - Abnormal; Notable for the following:    Glucose-Capillary 131 (*)    All other components within normal limits  GLUCOSE, CAPILLARY - Abnormal; Notable for the following:    Glucose-Capillary 172 (*)    All other components within normal limits  GLUCOSE, CAPILLARY - Abnormal; Notable for the following:    Glucose-Capillary 183 (*)    All other components within normal limits  BASIC METABOLIC PANEL - Abnormal; Notable for the following:    CO2 34 (*)    Glucose, Bld 136 (*)    BUN 42 (*)    Creatinine, Ser 1.75 (*)    GFR calc non Af Amer 34 (*)    GFR calc Af Amer 39 (*)  All other components within normal limits  GLUCOSE, CAPILLARY - Abnormal; Notable for the following:    Glucose-Capillary 133 (*)    All other components within normal limits  TROPONIN I  TROPONIN I  TROPONIN I  TROPONIN I   No results found.   1. CHF (congestive heart failure)   2. Atrial fibrillation   3. Bronchitis, acute   4. CAD (coronary artery disease)   5. CHF, acute   6. CKD (chronic kidney disease), stage II   7. COPD (chronic obstructive pulmonary disease)   8. Altered mental status   9. Aspiration pneumonia   10. Hyperlipidemia   11. HTN (hypertension)   12. DM (diabetes mellitus), type 2, uncontrolled, with renal complications, diet controlled   13. Dementia   14. PND (post-nasal drip)   15. UTI (lower urinary tract infection)   16. Fever   17. Elevated serum creatinine   18. Leukocytosis   19. Vertigo       MDM          Benny Lennert, MD 09/26/12 [phone]

## 2012-10-02 ENCOUNTER — Telehealth: Payer: Self-pay | Admitting: Family Medicine

## 2012-10-02 NOTE — Telephone Encounter (Signed)
Patient Information:  Caller Name: Luan Pulling  Phone: [phone]  Patient: Lucas Obrien, Lucas Obrien  Gender: Male  DOB: 1924-11-12  Age: 79 Years  PCP: Sheliah Hatch.  Office Follow Up:  Does the office need to follow up with this patient?: Yes  Instructions For The Office: Son concerned is it Lasix causing blood sugar elevation.  RN Note:  Reviewed Home care instructions and call back parameters reviewed. Son is going to keep GBS log.  He would like physician to know about patient sugar elevation and questions about lasix and does is cause elevation in sugar. Please contact Son  Symptoms  Reason For Call & Symptoms: Patient was admitted to [facility] on 4/23 with CHF and Afib.  He was started on Lasix 40mg  daily . Son is concerned about his dads blood sugars.  In the morning - Blod sugar [id number].  In the afternoon,  reading #165-  #231.  He was told the Lasix can cause his blood sugar to go up.   He is not on any diabetic medication. He has always controlled it by diet over 1 year.  son states he is doing low NA, grilled, sugar free . occasionally a sweet. weight 189lbs  Reviewed Health History In EMR: Yes  Reviewed Medications In EMR: Yes  Reviewed Allergies In EMR: Yes  Reviewed Surgeries / Procedures: Yes  Date of Onset of Symptoms: 10/02/2012  Treatments Tried: continous 02  2L having therapy and nursing care home health  Treatments Tried Worked: No  Guideline(s) Used:  Diabetes - High Blood Sugar  Disposition Per Guideline:   Home Care  Reason For Disposition Reached:   Blood glucose 60-240 mg/dl (3.5 -13 mmol/l)  Advice Given:  General  Definition of hyperglycemia: - Fasting blood glucose more than 140 mg/dL (7.5 mmol/l) or random blood glucose more than 200 mg/dL (11 mmol/l).  Symptoms of mild hyperglycemia: frequent urination, increased thirst, fatigue, blurred vision.  Symptoms of severe hyperglycemia: weakness, progressing to confusion and coma.  Treatment - Liquids  Drink at least one glass (8 oz or 240 ml) of water per hour for the next 4 hours. (Reason: adequate hydration will reduce hyperglycemia).  Generally, you should try to drink 6-8 glasses of water each day.  Measure and Record Your Blood Glucose  Every day you should measure your blood glucose before breakfast and before going to bed.  Record the results and show them to your doctor at your next office visit.  Daily Blood Glucose Goals  Pre-prandial (before meal): 70-130 mg/dL (8.2-9.5 mmol/l)  Post-prandial (2-3 hours after a meal): Less than 180 mg/dL (10 mmol/l)  Call Back If:  Blood glucose more than 300 mg/dL (62.1 mmol/l), 2 or more times in a row.  Rapid breathing occurs  You become worse.  Patient Will Follow Care Advice:  YES

## 2012-10-02 NOTE — Telephone Encounter (Signed)
Pt son called back. States that pt blood sugars have been extremely elevated lately. Told to bring in log of blood sugars if possible. Pt has an appt with Tabori on 10/03/12 at 11:30. JLT

## 2012-10-03 ENCOUNTER — Telehealth: Payer: Self-pay | Admitting: *Deleted

## 2012-10-03 ENCOUNTER — Encounter: Payer: Self-pay | Admitting: Family Medicine

## 2012-10-03 ENCOUNTER — Ambulatory Visit (INDEPENDENT_AMBULATORY_CARE_PROVIDER_SITE_OTHER): Payer: Medicare Other | Admitting: Family Medicine

## 2012-10-03 VITALS — BP 148/60 | HR 50 | Temp 98.1°F | Ht 65.0 in | Wt 194.2 lb

## 2012-10-03 DIAGNOSIS — N058 Unspecified nephritic syndrome with other morphologic changes: Secondary | ICD-10-CM

## 2012-10-03 DIAGNOSIS — E785 Hyperlipidemia, unspecified: Secondary | ICD-10-CM

## 2012-10-03 DIAGNOSIS — I5030 Unspecified diastolic (congestive) heart failure: Secondary | ICD-10-CM

## 2012-10-03 DIAGNOSIS — E1129 Type 2 diabetes mellitus with other diabetic kidney complication: Secondary | ICD-10-CM

## 2012-10-03 LAB — BASIC METABOLIC PANEL
CO2: 26 mEq/L (ref 19–32)
Calcium: 9.1 mg/dL (ref 8.4–10.5)
Creatinine, Ser: 1.7 mg/dL — ABNORMAL HIGH (ref 0.4–1.5)
GFR: 48.78 mL/min — ABNORMAL LOW (ref >60.00)
Glucose, Bld: 144 mg/dL — ABNORMAL HIGH (ref 70–99)
Sodium: 136 mEq/L (ref 135–145)

## 2012-10-03 LAB — LIPID PANEL
Cholesterol: 139 mg/dL (ref 0–200)
HDL: 41.5 mg/dL (ref >39.00)
Triglycerides: 92 mg/dL (ref 0.0–149.0)
VLDL: 18.4 mg/dL (ref 0.0–40.0)

## 2012-10-03 LAB — HEPATIC FUNCTION PANEL
Albumin: 3.4 g/dL — ABNORMAL LOW (ref 3.5–5.2)
Alkaline Phosphatase: 61 U/L (ref 39–117)
Total Protein: 7.4 g/dL (ref 6.0–8.3)

## 2012-10-03 MED ORDER — GLUCOSE BLOOD VI STRP: ORAL_STRIP | Status: AC

## 2012-10-03 NOTE — Patient Instructions (Addendum)
Follow up in 3 months to recheck diabetes We'll notify you of your lab results and make any changes if needed Try and limit his food intake- he may be eating b/c he's bored Keep up the good work on taking such good care of him!

## 2012-10-03 NOTE — Progress Notes (Signed)
  Subjective:    Patient ID: Lucas Obrien, male    DOB: 14-Mar-1925, 79 y.o.   MRN: [id number]  HPI Hospital f/u- pt was admitted to hospital on 4/23 w/ CHF.  D/c'd on 4/26.  Now on 40mg  Lasix daily.  no swelling since returning home.  Son reports no difficulty breathing, no CP, edema since d/c but CBGs have been elevated.  Pt has been well controlled off meds until recently and now fasting sugars are 130-160 and PM sugars are 170-210.  Son concerned that meter may not be accurate.  Pt reports feeling 'fine'.  Denies dizziness. Increased appetite since returning home- son fears he is overeating.   Review of Systems For ROS see HPI     Objective:   Physical Exam  Vitals reviewed. Constitutional: He is oriented to person, place, and time. He appears well-developed and well-nourished. No distress.  HENT:  Head: Normocephalic and atraumatic.  Eyes: Conjunctivae and EOM are normal. Pupils are equal, round, and reactive to light.  Neck: Normal range of motion. Neck supple. No thyromegaly present.  Cardiovascular: Regular rhythm, normal heart sounds and intact distal pulses.   No murmur heard. bradycardic  Pulmonary/Chest: Effort normal and breath sounds normal. No respiratory distress.  Abdominal: Soft. Bowel sounds are normal. He exhibits no distension.  Musculoskeletal: He exhibits no edema.  Lymphadenopathy:    He has no cervical adenopathy.  Neurological: He is alert and oriented to person, place, and time. No cranial nerve deficit.  Skin: Skin is warm and dry.  Psychiatric: He has a normal mood and affect. His behavior is normal.          Assessment & Plan:

## 2012-10-03 NOTE — Assessment & Plan Note (Signed)
Chronic problem.  Tolerating statin w/out difficulty.  Check labs.  Adjust meds prn  

## 2012-10-03 NOTE — Assessment & Plan Note (Signed)
Chronic problem.  Pt is not currently on meds due to renal insufficiency.  Will check A1C to determine if pt has had dramatic shift in #s.  May need to start low dose glipizide if A1C is climbing.  Stressed importance of ADA/low card diet.  Son very aware and monitors closely.  New meter given.  Will follow.

## 2012-10-03 NOTE — Assessment & Plan Note (Signed)
New.  Pt recently hospitalized for this.  Currently asymptomatic on lasix.  Due to check BMP for hx of renal insufficiency.  Will follow closely.

## 2012-10-03 NOTE — Telephone Encounter (Signed)
Received a call from David(PT) with [facility] on (10/02/12) needing verbal orders from Dr. Beverely Low to continue PT up to (10/21/12) on the pt to make sure the pt is doing okay,to reinforce safety, and exercise program.  Halford Decamp with [facility] back on (10/02/12) and verbally gave him orders per Dr. Beverely Low to continue the PT.//AB/CMA

## 2012-10-10 ENCOUNTER — Telehealth: Payer: Self-pay | Admitting: *Deleted

## 2012-10-10 NOTE — Telephone Encounter (Signed)
Called and LM @ (4:48pm) with Lucas Obrien with [facility] informing her of Dr.Tabori's recommendation below.  Asked [name] to please give me a call on Wed (10-11-12) regarding the message.  Also called and spoke with the pt's son Lucas Obrien) and informed him of Dr. Rennis Golden recommendation as well.  He stated should he go ahead and start today with given the pt the 1/2 of tab tonight.  Informed him to go ahead and give the pt a 1/2 tab tonight.//AB/CMA

## 2012-10-10 NOTE — Telephone Encounter (Signed)
Nurse called to report that Pt pulse was low at 40.Marland Kitchen Pt normally runs in the 50s. Pt is asymptomatic and all other vitals were normal.

## 2012-10-10 NOTE — Telephone Encounter (Signed)
Should decrease metoprolol to 1/2 tab twice daily (instead of 1 tab twice daily)

## 2012-10-10 NOTE — Telephone Encounter (Signed)
Noted-- forward to Dr Beverely Low for [facility]

## 2012-10-11 ENCOUNTER — Ambulatory Visit: Payer: Medicare Other | Admitting: Family Medicine

## 2012-10-11 NOTE — Telephone Encounter (Signed)
Spoke with Lucas Obrien with [facility] to confirm she received the verbal orders given by Dr. Beverely Low regarding decreasing the pt's Metoprolol .   [name] stated that yes she received the verbal orders.//AB/CMA

## 2012-10-14 ENCOUNTER — Other Ambulatory Visit: Payer: Self-pay | Admitting: Family Medicine

## 2012-10-16 NOTE — Telephone Encounter (Signed)
Rx sent to the pharmacy by e-script.//AB/CMA 

## 2012-10-17 ENCOUNTER — Other Ambulatory Visit: Payer: Self-pay | Admitting: Family Medicine

## 2012-10-19 NOTE — Telephone Encounter (Signed)
Rx sent to the pharmacy by e-script.//AB/CMA 

## 2012-10-30 ENCOUNTER — Other Ambulatory Visit: Payer: Self-pay | Admitting: Family Medicine

## 2012-10-31 NOTE — Telephone Encounter (Signed)
Med filled.  

## 2012-11-11 ENCOUNTER — Other Ambulatory Visit: Payer: Self-pay | Admitting: Family Medicine

## 2012-11-13 NOTE — Telephone Encounter (Signed)
Rx sent to the pharmacy by e-script.//AB/CMA 

## 2012-11-24 ENCOUNTER — Other Ambulatory Visit: Payer: Self-pay | Admitting: Family Medicine

## 2012-11-27 ENCOUNTER — Telehealth: Payer: Self-pay | Admitting: Family Medicine

## 2012-11-27 NOTE — Telephone Encounter (Signed)
Caller: Lucas Obrien/Child; Phone: [phone]; Reason for Call: Lucas Obrien/Son requesting list of sitters or home health agenicies (that provide sitters) that are recommeneded by office that are available to stay with Dad/Lucas Obrien while Lucas Obrien at work.  Needs help from 14: 30 to 23: 30.  Monday -Friday.  Lucas Obrien aware that callback may not be today 11/27/12.  [name] states he has taken this week off from vacation and is available through 12/01/12.

## 2012-11-27 NOTE — Telephone Encounter (Signed)
Any of the available agencies would be fine.  Pt has dementia but not severe- very pleasant but will wander if left alone

## 2012-11-27 NOTE — Telephone Encounter (Signed)
Given the Pt history is there a particular agency you prefer for this Pt or is the routine agencies fine to provide to Pt son.

## 2012-11-28 NOTE — Telephone Encounter (Signed)
Pt son given names of agency

## 2012-12-04 ENCOUNTER — Telehealth: Payer: Self-pay | Admitting: Family Medicine

## 2012-12-04 NOTE — Telephone Encounter (Signed)
Msg left for a return call. I left a VM for the son to keep an eye on the urine output and if the patient is still unable to void in the next few hours he will need to go to the ED to be cath to release his bladder. I advised if any questions or concerns call the after hours nurse.     KP

## 2012-12-04 NOTE — Telephone Encounter (Signed)
Please make stat [facility] referral for assistance in sitter or other arrangments

## 2012-12-04 NOTE — Telephone Encounter (Signed)
Caller: Ricky/Child; Phone: [phone]; Reason for Call: Please call son if needed.  He is trying to arrange a sitter for patient when son is working and needs a prescription/recommendation from Dr.  Beverely Low sent to [facility] at phone, [phone].  Son also wanted Dr.  Beverely Low to be aware that patient gained 4 lbs from 12/01/2012 and according to his Lasix prescription instructions, a 2nd dose was given at on 12/04/2012 10: 00 and patient has not urinated since.

## 2012-12-05 NOTE — Telephone Encounter (Signed)
Called Lucas Obrien back and informed him that the referral to [facility] has been faxed and he should hear from them regarding a Child psychotherapist and getting the pt some assistance.  Also informed him if he does not hear from them in a week or more to please give me a call back to let me know.   Lucas Obrien understood.//AB/CMA   Spoke with [name] this am to see how the pt was doing.   Lucas Obrien said the pt was doing fine and he did start to urinate.  The first urination was at 6:00pm which he was not able to a reading, next @ 11:45pm output(300cc), and pt went in the middle of the night and the output was(700cc).   Lucas Obrien stated that the pt has not urinated this am b/c he has not gotten up yet.  He stated that the pt is not in any discomfort.  Informed Lucas Obrien that I am doing an [facility] referral for the pt, and I will call him back to let him know when I fax the referral.   Lucas Obrien understood.//AB/CMA

## 2012-12-11 ENCOUNTER — Telehealth: Payer: Self-pay | Admitting: Family Medicine

## 2012-12-11 DIAGNOSIS — F0391 Unspecified dementia with behavioral disturbance: Secondary | ICD-10-CM

## 2012-12-11 NOTE — Telephone Encounter (Signed)
Tiffany with [facility] is calling requesting to speak with [name] about orders she received on this patient.

## 2012-12-12 NOTE — Telephone Encounter (Signed)
Spoke w/ Tiffany w/ [facility] regarding orders on the pt.  Tiffany stated b/c of the pt's ins which is [facility]) they will not be able to see the pt.//AB/CMA

## 2012-12-14 ENCOUNTER — Other Ambulatory Visit: Payer: Self-pay | Admitting: Family Medicine

## 2012-12-14 NOTE — Addendum Note (Signed)
Addended by: Verdie Shire on: 12/14/2012 12:48 PM   Modules accepted: Orders

## 2012-12-15 ENCOUNTER — Telehealth: Payer: Self-pay | Admitting: Family Medicine

## 2012-12-15 NOTE — Telephone Encounter (Signed)
At this time, I don't know who will accept this referral.  Please pass onto the referral coordinator to help [place]

## 2012-12-15 NOTE — Telephone Encounter (Signed)
Please advise.//AB/CMA 

## 2012-12-15 NOTE — Telephone Encounter (Signed)
Refill done.  

## 2012-12-15 NOTE — Telephone Encounter (Signed)
Melissa from [facility] called to let dr Beverely Low know that they could not accept the referral because Lucas Obrien has BCBS and [facility] is currently not accepting bcbs in his area. thanks

## 2012-12-18 NOTE — Telephone Encounter (Signed)
Faxed referral to [facility] with referral,demographics,OV, and copy of ins card.//AB/CMA

## 2012-12-25 ENCOUNTER — Telehealth: Payer: Self-pay | Admitting: Family Medicine

## 2012-12-25 NOTE — Telephone Encounter (Signed)
Patient Information:  Caller Name: Marla Roe  Phone: [phone]  Patient: [name]  Gender: Male  DOB: 05/27/1925  Age: 79 Years  PCP: Sheliah Hatch.  Office Follow Up:  Does the office need to follow up with this patient?: Yes  Instructions For The Office: Brynda Rim from [facility] requesting referral for [facility] regarding community resources- care giver needs respite   Symptoms  Reason For Call & Symptoms: Carol/RN from [facility] calling to notify office that [place] had first home health visit on 12/25/12. Requesting referral for [employer] regarding community resources. Care giver needs respite. Okey Regal can be reached at [phone] if needed  Reviewed Health History In EMR: N/A  Reviewed Medications In EMR: N/A  Reviewed Allergies In EMR: N/A  Reviewed Surgeries / Procedures: N/A  Date of Onset of Symptoms: Unknown  Guideline(s) Used:  No Protocol Available - Sick Adult  Disposition Per Guideline:   Discuss with PCP and Callback by Nurse within 1 Hour  Reason For Disposition Reached:   Nursing judgment  Advice Given:  N/A  Patient Will Follow Care Advice:  YES

## 2012-12-25 NOTE — Telephone Encounter (Signed)
Ok for referral?

## 2012-12-26 NOTE — Telephone Encounter (Signed)
Called and spoke with Okey Regal RN w/ [facility] and gave her a verbal orders by Dr. Beverely Low for [facility] regarding community resources for the pt.//AB/CMA

## 2012-12-30 ENCOUNTER — Other Ambulatory Visit: Payer: Self-pay | Admitting: Family Medicine

## 2013-01-01 NOTE — Telephone Encounter (Signed)
Rx sent to the pharmacy by e-script.//AB/CMA 

## 2013-01-04 ENCOUNTER — Telehealth: Payer: Self-pay | Admitting: Family Medicine

## 2013-01-04 NOTE — Telephone Encounter (Signed)
Patient Information:  Caller Name: [name]  Phone: [phone]  Patient: Lucas Obrien, Lucas Obrien  Gender: Male  DOB: [birth year]  Age: 79 Years  PCP: Sheliah Hatch.  Office Follow Up:  Does the office need to follow up with this patient?: No  Instructions For The Office: N/A  RN Note:  Reports difficuty with [place] wearing O2 when he is not supervised.  At times tubing is too tight.  Suggested loosen tubing and reposition it so does not sit on the open sore. May try padding O2 tubing with gauze taped to tubing so tubing sits on top of ear and prevents tubing from rubbing or putting pressure on the sore.  Symptoms  Reason For Call & Symptoms: Small, open, non draining sore behind left ear related to tubing from O2  cannula.  FBS 1730 at 1000.  Father not present for triage.  Reviewed Health History In EMR: Yes  Reviewed Medications In EMR: Yes  Reviewed Allergies In EMR: Yes  Reviewed Surgeries / Procedures: Yes  Date of Onset of Symptoms: 01/04/2013  Treatments Tried: repositioned O2 tubing  Treatments Tried Worked: No  Guideline(s) Used:  Skin Injury  Disposition Per Guideline:   Home Care  Reason For Disposition Reached:   Superficial cut (scratch) or abrasion (scrape)  Advice Given:  Reassurance:  It sounds like a small cut or scrape that we Lucas Obrien treat at home.  Cleaning the Wound:  Wash the wound with soap and water for 5 minutes.  For any bleeding, apply direct pressure with a sterile gauze or clean cloth for 10 minutes.  Antibiotic Ointment  Apply an Antibiotic Ointment (e.g., OTC Bacitracin), covered by a Band-Aid or dressing. Change daily or if it becomes wet.  Option: A TEFLA dressing won't stick to the wound when it is removed.  Call Back If:  Looks infected (pus, redness, increasing tenderness)  Doesn't heal within 10 days  You become worse.  Patient Will Follow Care Advice:  YES

## 2013-01-05 NOTE — Telephone Encounter (Signed)
noted 

## 2013-01-10 DIAGNOSIS — I5032 Chronic diastolic (congestive) heart failure: Secondary | ICD-10-CM

## 2013-01-10 DIAGNOSIS — J441 Chronic obstructive pulmonary disease with (acute) exacerbation: Secondary | ICD-10-CM

## 2013-01-10 DIAGNOSIS — F0391 Unspecified dementia with behavioral disturbance: Secondary | ICD-10-CM

## 2013-01-10 DIAGNOSIS — N182 Chronic kidney disease, stage 2 (mild): Secondary | ICD-10-CM

## 2013-01-10 DIAGNOSIS — I129 Hypertensive chronic kidney disease with stage 1 through stage 4 chronic kidney disease, or unspecified chronic kidney disease: Secondary | ICD-10-CM

## 2013-01-16 ENCOUNTER — Ambulatory Visit (INDEPENDENT_AMBULATORY_CARE_PROVIDER_SITE_OTHER): Payer: Medicare Other | Admitting: Family Medicine

## 2013-01-16 ENCOUNTER — Encounter: Payer: Self-pay | Admitting: Family Medicine

## 2013-01-16 VITALS — BP 124/76 | HR 52 | Temp 98.2°F | Ht 65.0 in | Wt 194.0 lb

## 2013-01-16 DIAGNOSIS — N058 Unspecified nephritic syndrome with other morphologic changes: Secondary | ICD-10-CM

## 2013-01-16 DIAGNOSIS — I5030 Unspecified diastolic (congestive) heart failure: Secondary | ICD-10-CM

## 2013-01-16 DIAGNOSIS — J449 Chronic obstructive pulmonary disease, unspecified: Secondary | ICD-10-CM

## 2013-01-16 DIAGNOSIS — E1129 Type 2 diabetes mellitus with other diabetic kidney complication: Secondary | ICD-10-CM

## 2013-01-16 DIAGNOSIS — I4891 Unspecified atrial fibrillation: Secondary | ICD-10-CM

## 2013-01-16 DIAGNOSIS — F0390 Unspecified dementia without behavioral disturbance: Secondary | ICD-10-CM

## 2013-01-16 LAB — BASIC METABOLIC PANEL
BUN: 40 mg/dL — ABNORMAL HIGH (ref 6–23)
CO2: 28 mEq/L (ref 19–32)
Calcium: 9.5 mg/dL (ref 8.4–10.5)
Chloride: 101 mEq/L (ref 96–112)
Creatinine, Ser: 1.8 mg/dL — ABNORMAL HIGH (ref 0.4–1.5)
Glucose, Bld: 128 mg/dL — ABNORMAL HIGH (ref 70–99)

## 2013-01-16 MED ORDER — ALBUTEROL SULFATE HFA 108 (90 BASE) MCG/ACT IN AERS: 2.0000 | INHALATION_SPRAY | RESPIRATORY_TRACT | Status: AC | PRN

## 2013-01-16 NOTE — Progress Notes (Signed)
  Subjective:    Patient ID: Lucas Obrien, male    DOB: 1925/01/17, 79 y.o.   MRN: [id number]  HPI DM- chronic problem.  Has stopped Metformin due to renal insufficiency.  Attempting to control w/ healthy diet.  On ARB for renal protection.  No weakness/numbness hands/feet.  No N/V, no CP.  Dementia- currently unable to remain alone.  Has a private caregiver which is 'working out pretty good'.  Is looking into starting a program through [facility]  On Aricept and Namenda  CHF- currently well controlled.  On O2 regularly.  No swelling.  No SOB.  Doing monthly weights over the phone w/ [employer] case manager.  Has not seen cards outside of hospital.  COPD- pt on Spiriva and Symbicort.  Case manager suggested to son that pt get a nebulizer but this machine greatly upset pt in the hospital.  Pt knows how to use inhaler.   Review of Systems For ROS see HPI     Objective:   Physical Exam  Vitals reviewed. Constitutional: He appears well-developed and well-nourished. No distress.  HENT:  O2 in place via nasal cannula  Neck: Normal range of motion. Neck supple. No thyromegaly present.  Cardiovascular: Intact distal pulses.   Bradycardic but regular S1/S2  Pulmonary/Chest: Effort normal and breath sounds normal. No respiratory distress. He has no wheezes. He has no rales.  Musculoskeletal: He exhibits no edema.  Neurological: He is alert.  Oriented to person and place  Skin: Skin is warm and dry.          Assessment & Plan:

## 2013-01-16 NOTE — Assessment & Plan Note (Signed)
Chronic problem.  Pt on both aricept and namenda.  sxs are currently stable.  Son has hired Contractor to assist w/ father's care.  Will continue to follow.  Applauded son on his care taking efforts.

## 2013-01-16 NOTE — Assessment & Plan Note (Signed)
Chronic problem.  Attempting to control w/ diet due to renal insufficiency.  HH is assisting.  Check labs.  Start meds prn.

## 2013-01-16 NOTE — Assessment & Plan Note (Signed)
Chronic problem.  Pt is now O2 dependent.  Asymptomatic currently.  Add Albuterol inhaler prn wheezing.

## 2013-01-16 NOTE — Patient Instructions (Signed)
Schedule your complete physical in 3 months We'll notify you of your lab results and make any changes if needed Use the albuterol inhaler as needed for wheezing or shortness of breath We'll call you with your cardiology appt Call with any questions or concerns Hang in there!

## 2013-01-16 NOTE — Assessment & Plan Note (Signed)
Pt has HH and insurance case manager following along but has not seen cardiology since dx.  Pt's weights are stable and taking lasix as directed.  Currently asymptomatic and no signs of volume overload.  Refer to cards.

## 2013-01-16 NOTE — Assessment & Plan Note (Signed)
Pt is bradycardic w/ regular rhythm today.  Was in Afib when acutely ill and has hx of similar.  Family decided against anti-coag due to fall risk.  Has not seen cards in [place].  Will refer.

## 2013-01-18 ENCOUNTER — Encounter: Payer: Self-pay | Admitting: Cardiology

## 2013-01-18 ENCOUNTER — Ambulatory Visit (INDEPENDENT_AMBULATORY_CARE_PROVIDER_SITE_OTHER): Payer: Medicare Other | Admitting: Cardiology

## 2013-01-18 VITALS — BP 98/50 | HR 53 | Ht 65.0 in | Wt 196.0 lb

## 2013-01-18 DIAGNOSIS — I2510 Atherosclerotic heart disease of native coronary artery without angina pectoris: Secondary | ICD-10-CM

## 2013-01-18 DIAGNOSIS — I4891 Unspecified atrial fibrillation: Secondary | ICD-10-CM

## 2013-01-18 DIAGNOSIS — I5030 Unspecified diastolic (congestive) heart failure: Secondary | ICD-10-CM

## 2013-01-18 NOTE — Patient Instructions (Signed)
The current medical regimen is effective;  continue present plan and medications.  Follow up as needed 

## 2013-01-18 NOTE — Progress Notes (Signed)
HPI The patient presents as a new patient. He has been living in his community a couple of years. He moved from [place]. He had coronary disease with bypass grafting there. However, I don't have any of these details. He hadn't seen a cardiologist here. History is limited by dementia and he offers no details. He is here with his son. He was referred because of his past history and because of problems he had earlier this year. He was admitted in March with pneumonia. A month later he was admitted with respiratory failure which was apparently diastolic heart failure with atrial fibrillation. Because of his dementia and comorbid illnesses he was not thought to be a systemic anticoagulation candidate. I do see that he is in sinus rhythm. His son says he relates. They go out to dinner at the same cafeteria every day. With this level of activity he is not describing any chest pressure, neck or arm discomfort. He's not describing any new shortness of breath though he is on chronic O2. He doesn't notice any palpitations. He's had no presyncope or syncope. There has been no witnessed PND or orthopnea.  Of note I did review extensively his hospital records. This included an echocardiogram. He actually had a preserved ejection fraction. The EF was perhaps slightly low. There was no significant valvular abnormalities.  Allergies  Allergen Reactions  . Bee Venom     Unknown  . Penicillins     Unknown reaction    Current Outpatient Prescriptions  Medication Sig Dispense Refill  . albuterol (PROAIR HFA) 108 (90 BASE) MCG/ACT inhaler Inhale 2 puffs into the lungs every 4 (four) hours as needed for wheezing.  1 Inhaler  6  . allopurinol (ZYLOPRIM) 300 MG tablet Take 1 tablet (300 mg total) by mouth every morning.  30 tablet  6  . amLODipine (NORVASC) 5 MG tablet Take 1 tablet (5 mg total) by mouth every morning.  30 tablet  6  . aspirin 325 MG tablet Take 325 mg by mouth every morning.       . cetirizine  (ZYRTEC) 10 MG tablet Take 10 mg by mouth daily.      Marland Kitchen donepezil (ARICEPT) 5 MG tablet take 1 tablet by mouth at bedtime  30 tablet  11  . furosemide (LASIX) 40 MG tablet take 1 tablet by mouth ONCE DAILY;  WEIGH DAILY -- IF MORE THAN 2-POUND WEIGHT GAIN, TAKE 2 TABLETS THAT MORNING  30 tablet  5  . ipratropium (ATROVENT) 0.06 % nasal spray instill 1 spray into each nostril three times a day  15 mL  6  . meclizine (ANTIVERT) 25 MG tablet Take 25 mg by mouth daily as needed for dizziness.      . metoprolol (LOPRESSOR) 100 MG tablet 100 mg. Take 1/2 half tablet twice daily      . NAMENDA 10 MG tablet take 1 tablet by mouth twice a day  60 tablet  5  . simvastatin (ZOCOR) 10 MG tablet take 1 tablet by mouth at bedtime  90 tablet  1  . SPIRIVA HANDIHALER 18 MCG inhalation capsule inhale contents of 1 capsule by mouth once daily  30 capsule  6  . SYMBICORT 160-4.5 MCG/ACT inhaler inhale 2 puffs by mouth twice a day  10.2 g  2  . tamsulosin (FLOMAX) 0.4 MG CAPS Take 1 capsule (0.4 mg total) by mouth daily.  30 capsule  5  . thiamine (VITAMIN B-1) 100 MG tablet Take 100 mg by  mouth daily.      . valsartan-hydrochlorothiazide (DIOVAN-HCT) 320-25 MG per tablet Take 1 tablet by mouth daily.  30 tablet  5  . glucose blood test strip Test twice daily as directed.  Dx 250.42  100 each  12  . Lancets (ACCU-CHEK MULTICLIX) lancets use twice a day as directed  102 each  3   No current facility-administered medications for this visit.    Past Medical History  Diagnosis Date  . Heart disease   . Hypertension   . Hyperlipidemia   . Urine incontinence   . Alcohol problem drinking   . Emphysema of lung   . Type II diabetes mellitus   . Chronic kidney disease     "moderate"  . Arthritis     "all over"  . Gout   . Dementia   . UTI (lower urinary tract infection) 12/08/2011    "first one"  . Shortness of breath   . Pneumonia 08/21/2012  . Coronary artery disease   . Dysrhythmia     Past Surgical  History  Procedure Laterality Date  . Coronary artery bypass graft  1994    Family History  Problem Relation Age of Onset  . Arthritis Mother   . Cancer Father   . Hyperlipidemia Father   . Heart disease Father   . Hypertension Father   . Diabetes Father   . Stroke Sister   . Stroke Brother     History   Social History  . Marital Status: Single    Spouse Name: N/A    Number of Children: N/A  . Years of Education: N/A   Occupational History  . Not on file.   Social History Main Topics  . Smoking status: Former Smoker -- 0.50 packs/day for 67 years    Types: Cigarettes    Quit date: 05/31/2008  . Smokeless tobacco: Never Used  . Alcohol Use: Yes     Comment: 12/08/11 "last alcohol was ~ 20 yr ago"  . Drug Use: No  . Sexual Activity: No   Other Topics Concern  . Not on file   Social History Narrative  . No narrative on file    ROS: Limited by his dementia. As far as I can tell negative for all other systems.  PHYSICAL EXAM BP 98/50  Pulse 53  Ht 5\' 5"  (1.651 m)  Wt 196 lb (88.905 kg)  BMI 32.62 kg/m2 GEN:  No distress, frail-appearing NECK:  No jugular venous distention at 90 degrees, waveform within normal limits, carotid upstroke brisk and symmetric, no bruits, no thyromegaly LYMPHATICS:  No cervical adenopathy LUNGS:  Clear to auscultation bilaterally BACK:  No CVA tenderness CHEST:  Well healed sternotomy scar. HEART:  S1 and S2 within normal limits, no S3, no S4, no clicks, no rubs, no murmurs ABD:  Positive bowel sounds normal in frequency in pitch, no bruits, no rebound, no guarding, unable to assess midline mass or bruit with the patient seated. EXT:  2 plus pulses throughout, moderate edema, no cyanosis no clubbing SKIN:  No rashes no nodules NEURO:  Cranial nerves II through XII grossly intact, motor grossly intact throughout PSYCH:  Cognitively intact, oriented to person place and time  EKG:  Sinus bradycardia, rate 53, axis within normal  limits, intervals within normal limits, inferior lateral T-wave inversion. 01/18/2013  ASSESSMENT AND PLAN  CAD:  The patient has no symptoms. Conservative therapy is indicated given his advanced age and comorbid conditions.  ATRIAL FIBRILLATION:  He is  in sinus rhythm today. I discussed this diagnosis at length with his son. We discussed the signs or symptoms of this. Certainly if he develops this in the future I would suspect him to be symptomatic and would suggest that he go to the emergency room. At that point we could consider further therapies but for now I made no change to his regimen.  DIASTOLIC HF:  I discussed this diagnosis with his son seems very attentive. I did caution him on salt as they eat out every day.

## 2013-01-20 ENCOUNTER — Other Ambulatory Visit: Payer: Self-pay | Admitting: Family Medicine

## 2013-01-23 NOTE — Telephone Encounter (Signed)
Med filled.  

## 2013-02-08 ENCOUNTER — Telehealth: Payer: Self-pay

## 2013-02-08 ENCOUNTER — Encounter: Payer: Self-pay | Admitting: Internal Medicine

## 2013-02-08 ENCOUNTER — Ambulatory Visit (INDEPENDENT_AMBULATORY_CARE_PROVIDER_SITE_OTHER): Payer: Medicare Other | Admitting: Internal Medicine

## 2013-02-08 VITALS — BP 111/63 | HR 63 | Temp 98.5°F | Resp 14 | Wt 194.4 lb

## 2013-02-08 DIAGNOSIS — K4090 Unilateral inguinal hernia, without obstruction or gangrene, not specified as recurrent: Secondary | ICD-10-CM

## 2013-02-08 NOTE — Telephone Encounter (Signed)
Message left on triage line to call pt. No other information. Called pt back and received no answer. LMOVM.

## 2013-02-08 NOTE — Telephone Encounter (Signed)
Patient's son presents to the office to inquire if his father could be seen today. Advised son that his PCP could not but that Dr. Alwyn Ren could see him at 430p. Patient is experiencing lower abdominal swelling that has occurred before and gone away per patient. It was noticed by son when he was bathing him. Per son patient is in no pain and can walk as normal. Advised son that if there are any changes in his condition before he is seen this afternoon that he should be seen at the ED. States verbal understanding.

## 2013-02-08 NOTE — Telephone Encounter (Signed)
Noted  

## 2013-02-08 NOTE — Patient Instructions (Addendum)
Avoid heavy lifting because of  the hernia. Apply pressure to this area when coughing or sneezing. Please do not eat &  be seen immediately if there is persistent  severe pain in this area. 

## 2013-02-08 NOTE — Progress Notes (Signed)
  Subjective:    Patient ID: Lucas Obrien, male    DOB: Jan 30, 1925, 79 y.o.   MRN: [id number]  HPI  He has noted a knot in the right inguinal area over the last several days. He mentioned it to his son after bathing. His son states it does disappear when he is supine it is apparent when standing  This is not associated with any discomfort.    Review of Systems  He denies constitutional symptoms of fever, chills, sweats, or weight loss. He has no dysuria, pyuria, or hematuria  He also denies bowel changes or constipation, diarrhea, melena, or rectal bleeding.     Objective:   Physical Exam General appearance adequate nourishment w/o distress. Appears younger than stated age  Eyes: No conjunctival inflammation or scleral icterus is present. Heart:  Normal rate and regular rhythm. S1 and S2 normal without gallop, murmur, click, rub or other extra sounds  . Flow murmur   Lungs: Diffuse dry rales. On supplemental oxygen.No increased work of breathing.   Abdomen: bowel sounds normal, soft and non-tender without masses, or  organomegaly. Large reducible right lower quadrant hernia present.  No guarding or rebound   Skin:Warm & dry.  Intact without suspicious lesions or rashes   Lymphatic: No lymphadenopathy is noted about the head, neck, axilla, or inguinal areas.             Assessment & Plan:  #1 reducible right lower quadrant direct hernia  Risks and options discussed

## 2013-03-01 ENCOUNTER — Other Ambulatory Visit: Payer: Self-pay | Admitting: Family Medicine

## 2013-03-01 NOTE — Telephone Encounter (Signed)
meds filled

## 2013-03-03 ENCOUNTER — Other Ambulatory Visit: Payer: Self-pay | Admitting: Family Medicine

## 2013-03-05 NOTE — Telephone Encounter (Signed)
Med filled.  

## 2013-03-10 ENCOUNTER — Other Ambulatory Visit: Payer: Self-pay | Admitting: Family Medicine

## 2013-03-12 NOTE — Telephone Encounter (Signed)
Med filled.  

## 2013-03-16 ENCOUNTER — Other Ambulatory Visit: Payer: Self-pay | Admitting: Family Medicine

## 2013-03-16 NOTE — Telephone Encounter (Signed)
Med filled.  

## 2013-03-18 ENCOUNTER — Other Ambulatory Visit: Payer: Self-pay | Admitting: Family Medicine

## 2013-03-19 NOTE — Telephone Encounter (Signed)
Med filled.  

## 2013-03-24 ENCOUNTER — Other Ambulatory Visit: Payer: Self-pay | Admitting: Family Medicine

## 2013-03-26 NOTE — Telephone Encounter (Signed)
Med filled.  

## 2013-04-18 ENCOUNTER — Other Ambulatory Visit: Payer: Self-pay | Admitting: General Practice

## 2013-04-18 ENCOUNTER — Encounter: Payer: Self-pay | Admitting: Family Medicine

## 2013-04-18 ENCOUNTER — Ambulatory Visit (INDEPENDENT_AMBULATORY_CARE_PROVIDER_SITE_OTHER): Payer: Medicare Other | Admitting: Family Medicine

## 2013-04-18 VITALS — BP 120/56 | HR 53 | Temp 98.1°F | Resp 17 | Wt 191.1 lb

## 2013-04-18 DIAGNOSIS — I5030 Unspecified diastolic (congestive) heart failure: Secondary | ICD-10-CM

## 2013-04-18 DIAGNOSIS — E1165 Type 2 diabetes mellitus with hyperglycemia: Secondary | ICD-10-CM

## 2013-04-18 DIAGNOSIS — E1129 Type 2 diabetes mellitus with other diabetic kidney complication: Secondary | ICD-10-CM

## 2013-04-18 DIAGNOSIS — IMO0002 Reserved for concepts with insufficient information to code with codable children: Secondary | ICD-10-CM

## 2013-04-18 DIAGNOSIS — N058 Unspecified nephritic syndrome with other morphologic changes: Secondary | ICD-10-CM

## 2013-04-18 DIAGNOSIS — Z23 Encounter for immunization: Secondary | ICD-10-CM

## 2013-04-18 DIAGNOSIS — E785 Hyperlipidemia, unspecified: Secondary | ICD-10-CM

## 2013-04-18 DIAGNOSIS — F0390 Unspecified dementia without behavioral disturbance: Secondary | ICD-10-CM

## 2013-04-18 LAB — BASIC METABOLIC PANEL WITH GFR
BUN: 44 mg/dL — ABNORMAL HIGH (ref 6–23)
CO2: 29 meq/L (ref 19–32)
Calcium: 10.3 mg/dL (ref 8.4–10.5)
Chloride: 105 meq/L (ref 96–112)
Creatinine, Ser: 1.8 mg/dL — ABNORMAL HIGH (ref 0.4–1.5)
GFR: 45.07 mL/min — ABNORMAL LOW
Glucose, Bld: 162 mg/dL — ABNORMAL HIGH (ref 70–99)
Potassium: 3.9 meq/L (ref 3.5–5.1)
Sodium: 141 meq/L (ref 135–145)

## 2013-04-18 LAB — HEMOGLOBIN A1C: Hgb A1c MFr Bld: 7.4 % — ABNORMAL HIGH (ref 4.6–6.5)

## 2013-04-18 LAB — HEPATIC FUNCTION PANEL
ALT: 18 U/L (ref 0–53)
AST: 24 U/L (ref 0–37)
Albumin: 3.6 g/dL (ref 3.5–5.2)
Alkaline Phosphatase: 61 U/L (ref 39–117)
Bilirubin, Direct: 0 mg/dL (ref 0.0–0.3)
Total Bilirubin: 0.7 mg/dL (ref 0.3–1.2)
Total Protein: 8.3 g/dL (ref 6.0–8.3)

## 2013-04-18 LAB — LIPID PANEL
Cholesterol: 142 mg/dL (ref 0–200)
HDL: 37.6 mg/dL — ABNORMAL LOW
LDL Cholesterol: 80 mg/dL (ref 0–99)
Total CHOL/HDL Ratio: 4
Triglycerides: 121 mg/dL (ref 0.0–149.0)
VLDL: 24.2 mg/dL (ref 0.0–40.0)

## 2013-04-18 LAB — CBC WITH DIFFERENTIAL/PLATELET
Basophils Absolute: 0 10*3/uL (ref 0.0–0.1)
Eosinophils Relative: 7.1 % — ABNORMAL HIGH (ref 0.0–5.0)
HCT: 34.6 % — ABNORMAL LOW (ref 39.0–52.0)
Hemoglobin: 11.1 g/dL — ABNORMAL LOW (ref 13.0–17.0)
Lymphs Abs: 1.8 10*3/uL (ref 0.7–4.0)
MCV: 94 fl (ref 78.0–100.0)
Monocytes Absolute: 0.6 10*3/uL (ref 0.1–1.0)
Monocytes Relative: 8.3 % (ref 3.0–12.0)
Neutro Abs: 4.2 10*3/uL (ref 1.4–7.7)
RDW: 15.7 % — ABNORMAL HIGH (ref 11.5–14.6)

## 2013-04-18 LAB — TSH: TSH: 3.66 u[IU]/mL (ref 0.35–5.50)

## 2013-04-18 MED ORDER — GLIPIZIDE ER 2.5 MG PO TB24: 2.5000 mg | ORAL_TABLET | Freq: Every day | ORAL | Status: AC

## 2013-04-18 NOTE — Patient Instructions (Signed)
Schedule your complete physical in 3 months We'll notify you of your lab results and make any changes if needed Keep up the good work!  You look great! Happy Holidays!!!

## 2013-04-18 NOTE — Assessment & Plan Note (Signed)
Chronic problem.  Not currently on meds due to renal insufficiency.  Son does excellent job of controlling his diet.  UTD on eye exam.  Check labs.  Start meds prn.

## 2013-04-18 NOTE — Progress Notes (Signed)
  Subjective:    Patient ID: Lucas Obrien, male    DOB: May 27, 1925, 79 y.o.   MRN: [id number]  HPI Pre visit review using our clinic review tool, if applicable. No additional management support is needed unless otherwise documented below in the visit note.   Dementia- pt is now getting up in the middle of the night, getting fully dressed, thinking he needs to go to work.  Otherwise son reports things are going well.  Son now has help in the home w/ caregivers.  On Aricpet and Namenda.    DM- chronic problem, attempting to control w/ diet b/c he had to stop Metformin due to increased Cr.  UTD on eye exam.  Sugars ranging from 150-200.  Denies symptomatic lows.  No CP, no symptomatic SOB, no HAs, visual changes, edema.  Hyperlipidemia- chronic problem, on Simvastatin.  Denies abd pain, N/V, myalgias.    CHF- currently on Lasix and Valsartan HCTZ.  Hx is per pt's son and ROS is difficult to obtain due to dementia   Review of Systems For ROS see HPI     Objective:   Physical Exam  Vitals reviewed. Constitutional: He is oriented to person, place, and time. He appears well-developed and well-nourished. No distress.  HENT:  Head: Normocephalic and atraumatic.  Eyes: Conjunctivae and EOM are normal. Pupils are equal, round, and reactive to light.  Neck: Normal range of motion. Neck supple. No thyromegaly present.  Cardiovascular: Normal rate, regular rhythm, normal heart sounds and intact distal pulses.   No murmur heard. Pulmonary/Chest: Effort normal and breath sounds normal. No respiratory distress.  Abdominal: Soft. Bowel sounds are normal. He exhibits no distension.  Musculoskeletal: He exhibits no edema.  Lymphadenopathy:    He has no cervical adenopathy.  Neurological: He is alert and oriented to person, place, and time. No cranial nerve deficit.  Skin: Skin is warm and dry.  Psychiatric: He has a normal mood and affect. His behavior is normal.          Assessment & Plan:

## 2013-04-18 NOTE — Assessment & Plan Note (Signed)
Chronic problem.  Tolerating meds w/out difficulty.  Check labs.  Adjust meds prn  

## 2013-04-18 NOTE — Assessment & Plan Note (Signed)
Deteriorating.  Pt now getting dressed in the middle of the night and son fears he will wander.  Has in-home caregivers to assist him.  Discussed possibility of placement.  Son is not ready for this yet.  Will follow.

## 2013-04-18 NOTE — Assessment & Plan Note (Signed)
Chronic problem.  No edema or crackles on PE.  Excellent volume control.  Check labs.  Will follow.

## 2013-04-19 ENCOUNTER — Other Ambulatory Visit: Payer: Self-pay | Admitting: Family Medicine

## 2013-04-19 NOTE — Telephone Encounter (Signed)
Med filled.  

## 2013-04-22 ENCOUNTER — Other Ambulatory Visit: Payer: Self-pay | Admitting: Family Medicine

## 2013-04-23 NOTE — Telephone Encounter (Signed)
Med filled.  

## 2013-05-12 ENCOUNTER — Emergency Department (HOSPITAL_COMMUNITY): Payer: Medicare Other

## 2013-05-12 ENCOUNTER — Emergency Department (HOSPITAL_COMMUNITY)
Admission: EM | Admit: 2013-05-12 | Discharge: 2013-05-12 | Disposition: A | Discharge status: Home / Self Care | Payer: Medicare Other | Attending: Emergency Medicine | Admitting: Emergency Medicine

## 2013-05-12 ENCOUNTER — Encounter (HOSPITAL_COMMUNITY): Payer: Self-pay | Admitting: Emergency Medicine

## 2013-05-12 ENCOUNTER — Other Ambulatory Visit: Payer: Self-pay | Admitting: Family Medicine

## 2013-05-12 DIAGNOSIS — I129 Hypertensive chronic kidney disease with stage 1 through stage 4 chronic kidney disease, or unspecified chronic kidney disease: Secondary | ICD-10-CM | POA: Insufficient documentation

## 2013-05-12 DIAGNOSIS — Z8701 Personal history of pneumonia (recurrent): Secondary | ICD-10-CM | POA: Insufficient documentation

## 2013-05-12 DIAGNOSIS — Z87440 Personal history of urinary (tract) infections: Secondary | ICD-10-CM | POA: Insufficient documentation

## 2013-05-12 DIAGNOSIS — E785 Hyperlipidemia, unspecified: Secondary | ICD-10-CM | POA: Insufficient documentation

## 2013-05-12 DIAGNOSIS — R059 Cough, unspecified: Secondary | ICD-10-CM | POA: Insufficient documentation

## 2013-05-12 DIAGNOSIS — R05 Cough: Secondary | ICD-10-CM

## 2013-05-12 DIAGNOSIS — Z79899 Other long term (current) drug therapy: Secondary | ICD-10-CM | POA: Insufficient documentation

## 2013-05-12 DIAGNOSIS — F0390 Unspecified dementia without behavioral disturbance: Secondary | ICD-10-CM | POA: Insufficient documentation

## 2013-05-12 DIAGNOSIS — Z87891 Personal history of nicotine dependence: Secondary | ICD-10-CM | POA: Insufficient documentation

## 2013-05-12 DIAGNOSIS — Z880 Allergy status to penicillin: Secondary | ICD-10-CM | POA: Insufficient documentation

## 2013-05-12 DIAGNOSIS — M109 Gout, unspecified: Secondary | ICD-10-CM | POA: Insufficient documentation

## 2013-05-12 DIAGNOSIS — H409 Unspecified glaucoma: Secondary | ICD-10-CM | POA: Insufficient documentation

## 2013-05-12 DIAGNOSIS — E119 Type 2 diabetes mellitus without complications: Secondary | ICD-10-CM | POA: Insufficient documentation

## 2013-05-12 DIAGNOSIS — R5381 Other malaise: Secondary | ICD-10-CM | POA: Insufficient documentation

## 2013-05-12 DIAGNOSIS — I2510 Atherosclerotic heart disease of native coronary artery without angina pectoris: Secondary | ICD-10-CM | POA: Insufficient documentation

## 2013-05-12 DIAGNOSIS — Z7982 Long term (current) use of aspirin: Secondary | ICD-10-CM | POA: Insufficient documentation

## 2013-05-12 DIAGNOSIS — N189 Chronic kidney disease, unspecified: Secondary | ICD-10-CM | POA: Insufficient documentation

## 2013-05-12 DIAGNOSIS — Z9981 Dependence on supplemental oxygen: Secondary | ICD-10-CM | POA: Insufficient documentation

## 2013-05-12 DIAGNOSIS — Z951 Presence of aortocoronary bypass graft: Secondary | ICD-10-CM | POA: Insufficient documentation

## 2013-05-12 HISTORY — DX: Dependence on supplemental oxygen: Z99.81

## 2013-05-12 HISTORY — DX: Unspecified glaucoma: H40.9

## 2013-05-12 LAB — COMPREHENSIVE METABOLIC PANEL
ALT: 17 U/L (ref 0–53)
AST: 32 U/L (ref 0–37)
Alkaline Phosphatase: 60 U/L (ref 39–117)
BUN: 51 mg/dL — ABNORMAL HIGH (ref 6–23)
CO2: 26 mEq/L (ref 19–32)
Calcium: 9.7 mg/dL (ref 8.4–10.5)
Chloride: 102 mEq/L (ref 96–112)
GFR calc Af Amer: 33 mL/min — ABNORMAL LOW (ref >90)
Glucose, Bld: 105 mg/dL — ABNORMAL HIGH (ref 70–99)
Potassium: 4.4 mEq/L (ref 3.5–5.1)
Sodium: 141 mEq/L (ref 135–145)
Total Bilirubin: 0.3 mg/dL (ref 0.3–1.2)

## 2013-05-12 LAB — POCT I-STAT TROPONIN I: Troponin i, poc: 0.03 ng/mL (ref 0.00–0.08)

## 2013-05-12 LAB — CBC WITH DIFFERENTIAL/PLATELET
HCT: 33.5 % — ABNORMAL LOW (ref 39.0–52.0)
Hemoglobin: 11.1 g/dL — ABNORMAL LOW (ref 13.0–17.0)
Lymphocytes Relative: 29 % (ref 12–46)
Lymphs Abs: 2 10*3/uL (ref 0.7–4.0)
MCH: 31.3 pg (ref 26.0–34.0)
Monocytes Relative: 6 % (ref 3–12)
Neutro Abs: 4 10*3/uL (ref 1.7–7.7)
Neutrophils Relative %: 59 % (ref 43–77)
RBC: 3.55 MIL/uL — ABNORMAL LOW (ref 4.22–5.81)
WBC: 6.8 10*3/uL (ref 4.0–10.5)

## 2013-05-12 LAB — PRO B NATRIURETIC PEPTIDE: Pro B Natriuretic peptide (BNP): 455.2 pg/mL — ABNORMAL HIGH (ref 0–450)

## 2013-05-12 MED ORDER — ALBUTEROL SULFATE (2.5 MG/3ML) 0.083% IN NEBU: 2.5000 mg | INHALATION_SOLUTION | RESPIRATORY_TRACT | Status: AC

## 2013-05-12 MED ORDER — PREDNISONE 20 MG PO TABS
60.0000 mg | ORAL_TABLET | ORAL | Status: AC
  Administered 2013-05-12: 60 mg via ORAL
  Filled 2013-05-12: qty 3

## 2013-05-12 NOTE — ED Provider Notes (Signed)
CSN: [id number]     Arrival date & time 05/12/13  1206 History   First MD Initiated Contact with Patient 05/12/13 1213     Chief Complaint  Patient presents with  . Cough  . Weakness    HPI Patient presents with his son who provides the history of present illness. Patient's multiple medical problems. Patient's son states over the past days he has had persistent, worsening cough, congestion.  Per report no fever, vomiting, diarrhea. Patient had a fall yesterday, this is not unusual. According to son the patient is at his baseline in terms of interactivity. Patient has continued to use all medication, including albuterol. Patient himself denies pain, nausea, dyspnea, any complaints. However, dementia limits history of present illness. Level V caveat.  Past Medical History  Diagnosis Date  . Hypertension   . Hyperlipidemia   . Urine incontinence   . Alcohol problem drinking   . Emphysema of lung   . Type II diabetes mellitus   . Chronic kidney disease     "moderate"  . Arthritis     "all over"  . Gout   . Dementia   . UTI (lower urinary tract infection) 12/08/2011    "first one"  . Shortness of breath   . Pneumonia 08/21/2012  . Coronary artery disease   . Dysrhythmia   . Oxygen dependent 2L  . Glaucoma   . Dementia    Past Surgical History  Procedure Laterality Date  . Coronary artery bypass graft  1994   Family History  Problem Relation Age of Onset  . Arthritis Mother   . Cancer Father   . Hyperlipidemia Father   . Heart disease Father   . Hypertension Father   . Diabetes Father   . Stroke Sister   . Stroke Brother    History  Substance Use Topics  . Smoking status: Former Smoker -- 0.50 packs/day for 67 years    Types: Cigarettes    Quit date: 05/31/2008  . Smokeless tobacco: Never Used  . Alcohol Use: Yes     Comment: 12/08/11 "last alcohol was ~ 20 yr ago"    Review of Systems  Unable to perform ROS: Dementia    Allergies  Bee venom and  Penicillins  Home Medications   Current Outpatient Rx  Name  Route  Sig  Dispense  Refill  . albuterol (PROAIR HFA) 108 (90 BASE) MCG/ACT inhaler   Inhalation   Inhale 2 puffs into the lungs every 4 (four) hours as needed for wheezing.   1 Inhaler   6   . allopurinol (ZYLOPRIM) 300 MG tablet      take 1 tablet by mouth every morning   30 tablet   6   . amLODipine (NORVASC) 5 MG tablet      take 1 tablet by mouth every morning   30 tablet   6   . aspirin 325 MG tablet   Oral   Take 325 mg by mouth every morning.          . cetirizine (ZYRTEC) 10 MG tablet   Oral   Take 10 mg by mouth daily.         Marland Kitchen donepezil (ARICEPT) 5 MG tablet      take 1 tablet by mouth at bedtime   30 tablet   11   . furosemide (LASIX) 40 MG tablet      take 1 tablet by mouth daily **WEIGH DAILY- IF MORE THAN 2 POUND WEIGHT GAIN,  TAKE 2 TABLETS THAT MORNING**   30 tablet   5   . glipiZIDE (GLUCOTROL XL) 2.5 MG 24 hr tablet   Oral   Take 1 tablet (2.5 mg total) by mouth daily with breakfast.   30 tablet   6   . glucose blood test strip      Test twice daily as directed.  Dx 250.42   100 each   12   . guaiFENesin (MUCINEX) 600 MG 12 hr tablet   Oral   Take 600 mg by mouth 2 (two) times daily as needed for cough.         Marland Kitchen ipratropium (ATROVENT) 0.06 % nasal spray      instill 1 spray into each nostril three times a day   15 mL   6   . Lancets (ACCU-CHEK MULTICLIX) lancets      use twice a day as directed   102 each   3   . meclizine (ANTIVERT) 25 MG tablet      take 1 tablet by mouth three times a day if needed for dizziness   90 tablet   0   . metoprolol (LOPRESSOR) 100 MG tablet      100 mg. Take 1/2 half tablet twice daily         . NAMENDA 10 MG tablet      take 1 tablet by mouth twice a day   60 tablet   5   . simvastatin (ZOCOR) 10 MG tablet      take 1 tablet by mouth at bedtime   90 tablet   1   . SPIRIVA HANDIHALER 18 MCG inhalation  capsule      inhale contents of 1 capsule by mouth once daily   30 capsule   6   . SYMBICORT 160-4.5 MCG/ACT inhaler      inhale 2 puffs by mouth twice a day   10.2 g   2   . tamsulosin (FLOMAX) 0.4 MG CAPS capsule      take 1 capsule by mouth once daily   30 capsule   5   . thiamine (VITAMIN B-1) 100 MG tablet   Oral   Take 100 mg by mouth daily.         . valsartan-hydrochlorothiazide (DIOVAN-HCT) 320-25 MG per tablet      take 1 tablet by mouth once daily   30 tablet   5   . albuterol (PROVENTIL) (2.5 MG/3ML) 0.083% nebulizer solution   Nebulization   Take 3 mLs (2.5 mg total) by nebulization every 4 (four) hours.   75 mL   12    BP 130/49  Pulse 47  Temp(Src) 98 F (36.7 C) (Oral)  Resp 16  SpO2 100% Physical Exam  Nursing note and vitals reviewed. Constitutional: He appears well-developed. No distress.  HENT:  Head: Normocephalic and atraumatic.  Eyes: Conjunctivae and EOM are normal.  Cardiovascular: Normal rate and regular rhythm.   Pulmonary/Chest: Effort normal. No stridor. No respiratory distress. He has decreased breath sounds.  Abdominal: He exhibits no distension.  Musculoskeletal: He exhibits no edema.  Neurological: He is alert. No cranial nerve deficit or sensory deficit.  Skin: Skin is warm and dry.  Superficial abrasion to left anterior inferior knee.   Psychiatric: He has a normal mood and affect. Cognition and memory are impaired.    ED Course  Procedures (including critical care time) Labs Review Labs Reviewed  CBC WITH DIFFERENTIAL - Abnormal; Notable for the  following:    RBC 3.55 (*)    Hemoglobin 11.1 (*)    HCT 33.5 (*)    Eosinophils Relative 6 (*)    All other components within normal limits  COMPREHENSIVE METABOLIC PANEL - Abnormal; Notable for the following:    Glucose, Bld 105 (*)    BUN 51 (*)    Creatinine, Ser 2.00 (*)    Total Protein 8.4 (*)    Albumin 3.3 (*)    GFR calc non Af Amer 29 (*)    GFR calc Af  Amer 33 (*)    All other components within normal limits  PRO B NATRIURETIC PEPTIDE - Abnormal; Notable for the following:    Pro B Natriuretic peptide (BNP) 455.2 (*)    All other components within normal limits  LACTIC ACID, PLASMA  POCT I-STAT TROPONIN I   Imaging Review Dg Chest 2 View  05/12/2013   CLINICAL DATA:  Shortness of breath  EXAM: CHEST  2 VIEW  COMPARISON:  09/20/2012  FINDINGS: Mild patchy bibasilar opacities, likely subpleural reticulation/ fibrosis. Increased interstitial markings, favored to be chronic, without frank interstitial edema. No pleural effusion or pneumothorax.  Cardiomegaly. Postsurgical changes related to prior CABG.  Degenerative changes of the visualized thoracolumbar spine.  IMPRESSION: Suspected mild chronic interstitial lung disease with subpleural reticulation/ fibrosis at the lung bases.  No evidence of acute cardiopulmonary disease.   Electronically Signed   By: Charline Bills M.D.   On: 05/12/2013 14:14    EKG Interpretation    Date/Time:  Saturday May 12 2013 13:26:43 EST Ventricular Rate:  48 PR Interval:  222 QRS Duration: 94 QT Interval:  599 QTC Calculation: 535 R Axis:   -21 Text Interpretation:  Sinus bradycardia Sinus pause LVH with secondary repolarization abnormality Prolonged QT interval Sinus bradycardia Abnormal ekg Confirmed by Gerhard Munch  MD [phone]) on 05/12/2013 4:16:57 PM           After the initial evaluation her the patient's chart, including mention of multiple medical problems, including a bradycardia, COPD, CHF.  4:17 PM On repeat exam the patient appears comfortable.  He is drinking a soda, no complaints. Patient's son states that he looks comfortable and better. MDM   1. Cough    This patient presents with his son to provide much in the history of present illness given the patient's history of dementia.  There is concern of cough, and with the patient's history of COPD, CHF, there suspicion for  other exacerbation or pneumonia.  Patient's labs, imaging is largely reassuring.  We had a lengthy conversation the primary care followup, and we initiated therapy, including steroids, albuterol and nebulizer.  Patient was discharged in stable condition.    Gerhard Munch, MD 05/12/13 [phone]

## 2013-05-12 NOTE — ED Notes (Signed)
Family member states he wants the patients knee to be looked at post fall yesterday.

## 2013-05-12 NOTE — ED Notes (Signed)
Pt c/o chest congestion and cough x 5 days with some generalized weakness; pt also fell yesterday and has abrasion to left knee

## 2013-05-12 NOTE — ED Notes (Signed)
I gave the patient 2 warm blankets. I gave the patients visitor a cup of coffee.

## 2013-05-14 NOTE — Telephone Encounter (Signed)
Med filled.  

## 2013-05-16 ENCOUNTER — Ambulatory Visit (INDEPENDENT_AMBULATORY_CARE_PROVIDER_SITE_OTHER): Payer: Medicare Other | Admitting: Family Medicine

## 2013-05-16 ENCOUNTER — Encounter: Payer: Self-pay | Admitting: Family Medicine

## 2013-05-16 VITALS — BP 126/60 | HR 64 | Temp 98.0°F | Resp 16 | Wt 186.4 lb

## 2013-05-16 DIAGNOSIS — N058 Unspecified nephritic syndrome with other morphologic changes: Secondary | ICD-10-CM

## 2013-05-16 DIAGNOSIS — J449 Chronic obstructive pulmonary disease, unspecified: Secondary | ICD-10-CM

## 2013-05-16 DIAGNOSIS — E1129 Type 2 diabetes mellitus with other diabetic kidney complication: Secondary | ICD-10-CM

## 2013-05-16 NOTE — Progress Notes (Signed)
   Subjective:    Patient ID: Lucas Obrien, male    DOB: May 05, 1925, 79 y.o.   MRN: [id number]  HPI Hospital f/u- pt went to ER on 12/13 for cough and chest congestion.  Had normal imaging.  D/c'd home on pred taper and nebs.  Sugars were initially very high (>300, 'almost 400') but this is improving.  Cough is improving.  No wheezing.  Still having some chest congestion- on mucinex, symbicort, spiriva, albuterol.  Pt did have a fall at home and has abrasion on L knee.  Son has been dressing w/ neosporin and bandaids.  No drainage, oozing, or pain at site.   Review of Systems Unable to obtain due to dementia     Objective:   Physical Exam  Vitals reviewed. Constitutional: He is oriented to person, place, and time. He appears well-developed and well-nourished. No distress.  HENT:  Head: Normocephalic and atraumatic.  Eyes: Conjunctivae and EOM are normal. Pupils are equal, round, and reactive to light.  Neck: Normal range of motion. Neck supple. No thyromegaly present.  Cardiovascular: Normal rate, regular rhythm, normal heart sounds and intact distal pulses.   No murmur heard. Pulmonary/Chest: Effort normal and breath sounds normal. No respiratory distress.  Abdominal: Soft. Bowel sounds are normal. He exhibits no distension.  Musculoskeletal: He exhibits no edema.  Lymphadenopathy:    He has no cervical adenopathy.  Neurological: He is alert and oriented to person, place, and time. No cranial nerve deficit.  Skin: Skin is warm and dry.  Superficial, healing abrasion on L anterior knee  Psychiatric: He has a normal mood and affect. His behavior is normal.          Assessment & Plan:

## 2013-05-16 NOTE — Progress Notes (Signed)
Pre visit review using our clinic review tool, if applicable. No additional management support is needed unless otherwise documented below in the visit note. 

## 2013-05-16 NOTE — Patient Instructions (Signed)
Follow up in Feb to recheck diabetes Continue the neb treatments as needed Continue the Spiriva, Symbicort, and mucinex Call with any questions or concerns MERRY CHRISTMAS!!!

## 2013-05-17 NOTE — Assessment & Plan Note (Signed)
Reviewed pt's ER imaging and notes.  Suspect pt's cough and SOB was due to COPD exacerbation.  sxs have improved w/ steroids and neb txs.  Asymptomatic in office today.  Continue Spiriva, Symbicort daily and albuterol prn.  Will follow.

## 2013-05-17 NOTE — Assessment & Plan Note (Signed)
Sugars were very high when on steroids but this is normalizing.  Next A1C will likely be higher b/c of this but son reports pt is tolerating Glipizide w/out difficulty and CBGs were in the 110s-120s after starting med.  Will follow.

## 2013-06-15 ENCOUNTER — Other Ambulatory Visit: Payer: Self-pay | Admitting: Family Medicine

## 2013-06-15 NOTE — Telephone Encounter (Signed)
Med filled.  

## 2013-07-14 ENCOUNTER — Other Ambulatory Visit: Payer: Self-pay | Admitting: Family Medicine

## 2013-07-18 NOTE — Telephone Encounter (Signed)
Med filled.  

## 2013-07-19 ENCOUNTER — Encounter: Payer: Medicare Other | Admitting: Family Medicine

## 2013-07-21 ENCOUNTER — Other Ambulatory Visit: Payer: Self-pay | Admitting: Family Medicine

## 2013-07-24 NOTE — Telephone Encounter (Signed)
Med filled.  

## 2013-07-28 ENCOUNTER — Encounter (HOSPITAL_COMMUNITY): Payer: Self-pay | Admitting: Emergency Medicine

## 2013-07-28 ENCOUNTER — Emergency Department (HOSPITAL_COMMUNITY): Payer: Medicare Other

## 2013-07-28 ENCOUNTER — Inpatient Hospital Stay (HOSPITAL_COMMUNITY)
Admission: EM | Admit: 2013-07-28 | Discharge: 2013-08-29 | DRG: 871 | Disposition: E | Payer: Medicare Other | Attending: Internal Medicine | Admitting: Internal Medicine

## 2013-07-28 DIAGNOSIS — M109 Gout, unspecified: Secondary | ICD-10-CM | POA: Diagnosis present

## 2013-07-28 DIAGNOSIS — R0600 Dyspnea, unspecified: Secondary | ICD-10-CM

## 2013-07-28 DIAGNOSIS — I2510 Atherosclerotic heart disease of native coronary artery without angina pectoris: Secondary | ICD-10-CM | POA: Diagnosis present

## 2013-07-28 DIAGNOSIS — IMO0002 Reserved for concepts with insufficient information to code with codable children: Secondary | ICD-10-CM | POA: Diagnosis present

## 2013-07-28 DIAGNOSIS — E785 Hyperlipidemia, unspecified: Secondary | ICD-10-CM | POA: Diagnosis present

## 2013-07-28 DIAGNOSIS — Z87891 Personal history of nicotine dependence: Secondary | ICD-10-CM

## 2013-07-28 DIAGNOSIS — G9340 Encephalopathy, unspecified: Secondary | ICD-10-CM | POA: Diagnosis present

## 2013-07-28 DIAGNOSIS — M129 Arthropathy, unspecified: Secondary | ICD-10-CM | POA: Diagnosis present

## 2013-07-28 DIAGNOSIS — Z8249 Family history of ischemic heart disease and other diseases of the circulatory system: Secondary | ICD-10-CM

## 2013-07-28 DIAGNOSIS — Z9981 Dependence on supplemental oxygen: Secondary | ICD-10-CM

## 2013-07-28 DIAGNOSIS — Z833 Family history of diabetes mellitus: Secondary | ICD-10-CM

## 2013-07-28 DIAGNOSIS — R42 Dizziness and giddiness: Secondary | ICD-10-CM | POA: Diagnosis present

## 2013-07-28 DIAGNOSIS — R4182 Altered mental status, unspecified: Secondary | ICD-10-CM | POA: Diagnosis present

## 2013-07-28 DIAGNOSIS — Z951 Presence of aortocoronary bypass graft: Secondary | ICD-10-CM

## 2013-07-28 DIAGNOSIS — J1100 Influenza due to unidentified influenza virus with unspecified type of pneumonia: Secondary | ICD-10-CM | POA: Diagnosis present

## 2013-07-28 DIAGNOSIS — N058 Unspecified nephritic syndrome with other morphologic changes: Secondary | ICD-10-CM | POA: Diagnosis present

## 2013-07-28 DIAGNOSIS — Z880 Allergy status to penicillin: Secondary | ICD-10-CM

## 2013-07-28 DIAGNOSIS — Z7982 Long term (current) use of aspirin: Secondary | ICD-10-CM

## 2013-07-28 DIAGNOSIS — I214 Non-ST elevation (NSTEMI) myocardial infarction: Secondary | ICD-10-CM | POA: Diagnosis present

## 2013-07-28 DIAGNOSIS — I129 Hypertensive chronic kidney disease with stage 1 through stage 4 chronic kidney disease, or unspecified chronic kidney disease: Secondary | ICD-10-CM | POA: Diagnosis present

## 2013-07-28 DIAGNOSIS — J80 Acute respiratory distress syndrome: Secondary | ICD-10-CM

## 2013-07-28 DIAGNOSIS — R799 Abnormal finding of blood chemistry, unspecified: Secondary | ICD-10-CM

## 2013-07-28 DIAGNOSIS — I10 Essential (primary) hypertension: Secondary | ICD-10-CM | POA: Diagnosis present

## 2013-07-28 DIAGNOSIS — Z823 Family history of stroke: Secondary | ICD-10-CM

## 2013-07-28 DIAGNOSIS — J449 Chronic obstructive pulmonary disease, unspecified: Secondary | ICD-10-CM | POA: Diagnosis present

## 2013-07-28 DIAGNOSIS — F0390 Unspecified dementia without behavioral disturbance: Secondary | ICD-10-CM | POA: Diagnosis present

## 2013-07-28 DIAGNOSIS — I6350 Cerebral infarction due to unspecified occlusion or stenosis of unspecified cerebral artery: Secondary | ICD-10-CM | POA: Diagnosis present

## 2013-07-28 DIAGNOSIS — R531 Weakness: Secondary | ICD-10-CM

## 2013-07-28 DIAGNOSIS — J189 Pneumonia, unspecified organism: Secondary | ICD-10-CM

## 2013-07-28 DIAGNOSIS — Z79899 Other long term (current) drug therapy: Secondary | ICD-10-CM

## 2013-07-28 DIAGNOSIS — D649 Anemia, unspecified: Secondary | ICD-10-CM | POA: Diagnosis present

## 2013-07-28 DIAGNOSIS — Z91038 Other insect allergy status: Secondary | ICD-10-CM

## 2013-07-28 DIAGNOSIS — I509 Heart failure, unspecified: Secondary | ICD-10-CM | POA: Diagnosis present

## 2013-07-28 DIAGNOSIS — J438 Other emphysema: Secondary | ICD-10-CM | POA: Diagnosis present

## 2013-07-28 DIAGNOSIS — A419 Sepsis, unspecified organism: Principal | ICD-10-CM | POA: Diagnosis present

## 2013-07-28 DIAGNOSIS — R509 Fever, unspecified: Secondary | ICD-10-CM | POA: Diagnosis present

## 2013-07-28 DIAGNOSIS — E1129 Type 2 diabetes mellitus with other diabetic kidney complication: Secondary | ICD-10-CM

## 2013-07-28 DIAGNOSIS — J9600 Acute respiratory failure, unspecified whether with hypoxia or hypercapnia: Secondary | ICD-10-CM | POA: Diagnosis present

## 2013-07-28 DIAGNOSIS — Z66 Do not resuscitate: Secondary | ICD-10-CM

## 2013-07-28 DIAGNOSIS — Z515 Encounter for palliative care: Secondary | ICD-10-CM

## 2013-07-28 DIAGNOSIS — N182 Chronic kidney disease, stage 2 (mild): Secondary | ICD-10-CM | POA: Diagnosis present

## 2013-07-28 DIAGNOSIS — D696 Thrombocytopenia, unspecified: Secondary | ICD-10-CM | POA: Diagnosis not present

## 2013-07-28 DIAGNOSIS — E1165 Type 2 diabetes mellitus with hyperglycemia: Secondary | ICD-10-CM | POA: Diagnosis present

## 2013-07-28 DIAGNOSIS — N179 Acute kidney failure, unspecified: Secondary | ICD-10-CM | POA: Diagnosis present

## 2013-07-28 DIAGNOSIS — R7989 Other specified abnormal findings of blood chemistry: Secondary | ICD-10-CM | POA: Diagnosis present

## 2013-07-28 LAB — TROPONIN I

## 2013-07-28 LAB — CBC WITH DIFFERENTIAL/PLATELET
BASOS PCT: 0 % (ref 0–1)
Basophils Absolute: 0 10*3/uL (ref 0.0–0.1)
Eosinophils Absolute: 0.2 10*3/uL (ref 0.0–0.7)
Eosinophils Relative: 3 % (ref 0–5)
HCT: 35.4 % — ABNORMAL LOW (ref 39.0–52.0)
Hemoglobin: 11.8 g/dL — ABNORMAL LOW (ref 13.0–17.0)
LYMPHS ABS: 0.7 10*3/uL (ref 0.7–4.0)
Lymphocytes Relative: 9 % — ABNORMAL LOW (ref 12–46)
MCH: 31.3 pg (ref 26.0–34.0)
MCHC: 33.3 g/dL (ref 30.0–36.0)
MCV: 93.9 fL (ref 78.0–100.0)
Monocytes Absolute: 0.6 10*3/uL (ref 0.1–1.0)
Monocytes Relative: 8 % (ref 3–12)
NEUTROS PCT: 80 % — AB (ref 43–77)
Neutro Abs: 6.1 10*3/uL (ref 1.7–7.7)
PLATELETS: 155 10*3/uL (ref 150–400)
RBC: 3.77 MIL/uL — ABNORMAL LOW (ref 4.22–5.81)
RDW: 15.6 % — ABNORMAL HIGH (ref 11.5–15.5)
WBC: 7.6 10*3/uL (ref 4.0–10.5)

## 2013-07-28 LAB — COMPREHENSIVE METABOLIC PANEL
ALBUMIN: 3.8 g/dL (ref 3.5–5.2)
ALK PHOS: 65 U/L (ref 39–117)
ALT: 21 U/L (ref 0–53)
AST: 30 U/L (ref 0–37)
BUN: 46 mg/dL — ABNORMAL HIGH (ref 6–23)
CO2: 28 mEq/L (ref 19–32)
Calcium: 10.2 mg/dL (ref 8.4–10.5)
Chloride: 100 mEq/L (ref 96–112)
Creatinine, Ser: 2.16 mg/dL — ABNORMAL HIGH (ref 0.50–1.35)
GFR calc Af Amer: 30 mL/min — ABNORMAL LOW (ref >90)
GFR calc non Af Amer: 26 mL/min — ABNORMAL LOW (ref >90)
GLUCOSE: 152 mg/dL — AB (ref 70–99)
POTASSIUM: 4.1 meq/L (ref 3.7–5.3)
SODIUM: 143 meq/L (ref 137–147)
TOTAL PROTEIN: 8.9 g/dL — AB (ref 6.0–8.3)
Total Bilirubin: 0.2 mg/dL — ABNORMAL LOW (ref 0.3–1.2)

## 2013-07-28 LAB — CBC
HCT: 29.5 % — ABNORMAL LOW (ref 39.0–52.0)
Hemoglobin: 9.7 g/dL — ABNORMAL LOW (ref 13.0–17.0)
MCH: 31 pg (ref 26.0–34.0)
MCHC: 32.9 g/dL (ref 30.0–36.0)
MCV: 94.2 fL (ref 78.0–100.0)
PLATELETS: 131 10*3/uL — AB (ref 150–400)
RBC: 3.13 MIL/uL — AB (ref 4.22–5.81)
RDW: 15.9 % — AB (ref 11.5–15.5)
WBC: 4.8 10*3/uL (ref 4.0–10.5)

## 2013-07-28 LAB — URINALYSIS, ROUTINE W REFLEX MICROSCOPIC
Bilirubin Urine: NEGATIVE
Glucose, UA: NEGATIVE mg/dL
Hgb urine dipstick: NEGATIVE
Ketones, ur: NEGATIVE mg/dL
Leukocytes, UA: NEGATIVE
Nitrite: NEGATIVE
PROTEIN: NEGATIVE mg/dL
Specific Gravity, Urine: 1.017 (ref 1.005–1.030)
UROBILINOGEN UA: 0.2 mg/dL (ref 0.0–1.0)
pH: 6.5 (ref 5.0–8.0)

## 2013-07-28 LAB — CREATININE, SERUM
CREATININE: 2.05 mg/dL — AB (ref 0.50–1.35)
GFR calc non Af Amer: 28 mL/min — ABNORMAL LOW (ref >90)
GFR, EST AFRICAN AMERICAN: 32 mL/min — AB (ref >90)

## 2013-07-28 LAB — INFLUENZA PANEL BY PCR (TYPE A & B)
H1N1FLUPCR: NOT DETECTED
INFLAPCR: POSITIVE — AB
INFLBPCR: NEGATIVE

## 2013-07-28 LAB — STREP PNEUMONIAE URINARY ANTIGEN: Strep Pneumo Urinary Antigen: NEGATIVE

## 2013-07-28 LAB — GLUCOSE, CAPILLARY
GLUCOSE-CAPILLARY: 101 mg/dL — AB (ref 70–99)
Glucose-Capillary: 157 mg/dL — ABNORMAL HIGH (ref 70–99)
Glucose-Capillary: 105 mg/dL — ABNORMAL HIGH (ref 70–99)

## 2013-07-28 LAB — HIV ANTIBODY (ROUTINE TESTING W REFLEX): HIV: NONREACTIVE

## 2013-07-28 LAB — HEMOGLOBIN A1C
Hgb A1c MFr Bld: 6.5 % — ABNORMAL HIGH (ref <5.7)
MEAN PLASMA GLUCOSE: 140 mg/dL — AB (ref <117)

## 2013-07-28 LAB — TSH: TSH: 2.311 u[IU]/mL (ref 0.350–4.500)

## 2013-07-28 LAB — VITAMIN B12: VITAMIN B 12: 493 pg/mL (ref 211–911)

## 2013-07-28 LAB — I-STAT CG4 LACTIC ACID, ED: LACTIC ACID, VENOUS: 2.31 mmol/L — AB (ref 0.5–2.2)

## 2013-07-28 MED ORDER — MEMANTINE HCL 10 MG PO TABS
10.0000 mg | ORAL_TABLET | Freq: Two times a day (BID) | ORAL | Status: DC
  Administered 2013-07-28 – 2013-07-30 (×6): 10 mg via ORAL
  Filled 2013-07-28 (×10): qty 1

## 2013-07-28 MED ORDER — METOPROLOL TARTRATE 50 MG PO TABS
50.0000 mg | ORAL_TABLET | Freq: Two times a day (BID) | ORAL | Status: DC
  Administered 2013-07-28 – 2013-08-01 (×7): 50 mg via ORAL
  Filled 2013-07-28 (×10): qty 1

## 2013-07-28 MED ORDER — INSULIN ASPART 100 UNIT/ML ~~LOC~~ SOLN
0.0000 [IU] | Freq: Three times a day (TID) | SUBCUTANEOUS | Status: DC
  Administered 2013-07-28: 3 [IU] via SUBCUTANEOUS
  Administered 2013-07-29: 2 [IU] via SUBCUTANEOUS
  Administered 2013-07-29: 5 [IU] via SUBCUTANEOUS
  Administered 2013-07-30: 3 [IU] via SUBCUTANEOUS
  Administered 2013-07-30: 1 [IU] via SUBCUTANEOUS
  Administered 2013-07-30: 3 [IU] via SUBCUTANEOUS

## 2013-07-28 MED ORDER — TIOTROPIUM BROMIDE MONOHYDRATE 18 MCG IN CAPS
18.0000 ug | ORAL_CAPSULE | Freq: Every day | RESPIRATORY_TRACT | Status: DC
  Administered 2013-07-28 – 2013-07-31 (×4): 18 ug via RESPIRATORY_TRACT
  Filled 2013-07-28: qty 5

## 2013-07-28 MED ORDER — DEXTROSE 5 % IV SOLN
500.0000 mg | Freq: Once | INTRAVENOUS | Status: AC
  Administered 2013-07-28: 500 mg via INTRAVENOUS

## 2013-07-28 MED ORDER — LORATADINE 10 MG PO TABS
10.0000 mg | ORAL_TABLET | Freq: Every day | ORAL | Status: DC
  Administered 2013-07-28 – 2013-07-30 (×3): 10 mg via ORAL
  Filled 2013-07-28 (×5): qty 1

## 2013-07-28 MED ORDER — SODIUM CHLORIDE 0.9 % IV SOLN
INTRAVENOUS | Status: AC
  Administered 2013-07-28: 19:00:00 via INTRAVENOUS

## 2013-07-28 MED ORDER — ALBUTEROL SULFATE (2.5 MG/3ML) 0.083% IN NEBU
5.0000 mg | INHALATION_SOLUTION | Freq: Once | RESPIRATORY_TRACT | Status: AC
Start: 2013-07-28 — End: 2013-07-28
  Administered 2013-07-28: 5 mg via RESPIRATORY_TRACT
  Filled 2013-07-28: qty 6

## 2013-07-28 MED ORDER — AMLODIPINE BESYLATE 5 MG PO TABS
5.0000 mg | ORAL_TABLET | Freq: Every day | ORAL | Status: DC
  Administered 2013-07-28 – 2013-07-30 (×3): 5 mg via ORAL
  Filled 2013-07-28 (×5): qty 1

## 2013-07-28 MED ORDER — TAMSULOSIN HCL 0.4 MG PO CAPS
0.4000 mg | ORAL_CAPSULE | Freq: Every day | ORAL | Status: DC
  Administered 2013-07-28 – 2013-07-30 (×3): 0.4 mg via ORAL
  Filled 2013-07-28 (×5): qty 1

## 2013-07-28 MED ORDER — SIMVASTATIN 10 MG PO TABS
10.0000 mg | ORAL_TABLET | Freq: Every day | ORAL | Status: DC
  Administered 2013-07-28 – 2013-07-30 (×3): 10 mg via ORAL
  Filled 2013-07-28 (×4): qty 1

## 2013-07-28 MED ORDER — LEVOFLOXACIN IN D5W 750 MG/150ML IV SOLN
750.0000 mg | INTRAVENOUS | Status: DC
  Administered 2013-07-28 – 2013-07-29 (×2): 750 mg via INTRAVENOUS
  Filled 2013-07-28 (×2): qty 150

## 2013-07-28 MED ORDER — IPRATROPIUM BROMIDE 0.02 % IN SOLN
0.5000 mg | Freq: Once | RESPIRATORY_TRACT | Status: AC
  Administered 2013-07-28: 0.5 mg via RESPIRATORY_TRACT
  Filled 2013-07-28: qty 2.5

## 2013-07-28 MED ORDER — VITAMIN B-1 100 MG PO TABS
100.0000 mg | ORAL_TABLET | Freq: Every day | ORAL | Status: DC
  Administered 2013-07-28 – 2013-08-02 (×5): 100 mg via ORAL
  Filled 2013-07-28 (×6): qty 1

## 2013-07-28 MED ORDER — DONEPEZIL HCL 5 MG PO TABS
5.0000 mg | ORAL_TABLET | Freq: Every day | ORAL | Status: DC
  Administered 2013-07-28 – 2013-07-30 (×3): 5 mg via ORAL
  Filled 2013-07-28 (×5): qty 1

## 2013-07-28 MED ORDER — IPRATROPIUM-ALBUTEROL 0.5-2.5 (3) MG/3ML IN SOLN
3.0000 mL | RESPIRATORY_TRACT | Status: DC
  Administered 2013-07-28 – 2013-07-29 (×4): 3 mL via RESPIRATORY_TRACT
  Filled 2013-07-28 (×4): qty 3

## 2013-07-28 MED ORDER — OSELTAMIVIR PHOSPHATE 30 MG PO CAPS
30.0000 mg | ORAL_CAPSULE | Freq: Every day | ORAL | Status: DC
  Administered 2013-07-28 – 2013-07-30 (×3): 30 mg via ORAL
  Filled 2013-07-28 (×5): qty 1

## 2013-07-28 MED ORDER — DEXTROSE 5 % IV SOLN
1.0000 g | Freq: Once | INTRAVENOUS | Status: AC
  Administered 2013-07-28: 1 g via INTRAVENOUS
  Filled 2013-07-28: qty 10

## 2013-07-28 MED ORDER — MECLIZINE HCL 25 MG PO TABS
25.0000 mg | ORAL_TABLET | Freq: Three times a day (TID) | ORAL | Status: DC | PRN
  Filled 2013-07-28: qty 1

## 2013-07-28 MED ORDER — ASPIRIN 325 MG PO TABS
325.0000 mg | ORAL_TABLET | Freq: Every morning | ORAL | Status: DC
  Administered 2013-07-28 – 2013-08-02 (×5): 325 mg via ORAL
  Filled 2013-07-28 (×7): qty 1

## 2013-07-28 MED ORDER — BUDESONIDE-FORMOTEROL FUMARATE 160-4.5 MCG/ACT IN AERO
2.0000 | INHALATION_SPRAY | Freq: Two times a day (BID) | RESPIRATORY_TRACT | Status: DC
  Administered 2013-07-28 – 2013-07-31 (×7): 2 via RESPIRATORY_TRACT
  Filled 2013-07-28: qty 6

## 2013-07-28 MED ORDER — ENOXAPARIN SODIUM 30 MG/0.3ML ~~LOC~~ SOLN
30.0000 mg | SUBCUTANEOUS | Status: DC
  Administered 2013-07-28 – 2013-08-02 (×6): 30 mg via SUBCUTANEOUS
  Filled 2013-07-28 (×6): qty 0.3

## 2013-07-28 MED ORDER — GUAIFENESIN 100 MG/5ML PO SYRP
200.0000 mg | ORAL_SOLUTION | ORAL | Status: DC | PRN
  Filled 2013-07-28: qty 10

## 2013-07-28 MED ORDER — ACETAMINOPHEN 500 MG PO TABS
1000.0000 mg | ORAL_TABLET | Freq: Once | ORAL | Status: AC
  Administered 2013-07-28: 1000 mg via ORAL
  Filled 2013-07-28: qty 2

## 2013-07-28 MED ORDER — INSULIN ASPART 100 UNIT/ML ~~LOC~~ SOLN: 0.0000 [IU] | Freq: Every day | SUBCUTANEOUS | Status: DC

## 2013-07-28 NOTE — ED Provider Notes (Signed)
CSN: [id number]     Arrival date & time 07/19/2013  4098 History   First MD Initiated Contact with Patient 07/09/2013 [phone]     Chief Complaint  Patient presents with  . Fever   Level V caveat: AMS  HPI Patient is brought to the emergency department by his son for altered mental status.  His son reports that he developed coughing congestion and confusion last night.  He's had no nausea vomiting or diarrhea.  No complaints of chest pain.  He ate a normal meal last night.  He was found to be febrile 201.1 on arrival to the emergency department.  Patient has no complaints at this time but his family reports that he is more confused than baseline.   Past Medical History  Diagnosis Date  . Hypertension   . Hyperlipidemia   . Urine incontinence   . Alcohol problem drinking   . Emphysema of lung   . Type II diabetes mellitus   . Chronic kidney disease     "moderate"  . Arthritis     "all over"  . Gout   . Dementia   . UTI (lower urinary tract infection) 12/08/2011    "first one"  . Shortness of breath   . Pneumonia 08/21/2012  . Coronary artery disease   . Dysrhythmia   . Oxygen dependent 2L  . Glaucoma   . Dementia    Past Surgical History  Procedure Laterality Date  . Coronary artery bypass graft  1994   Family History  Problem Relation Age of Onset  . Arthritis Mother   . Cancer Father   . Hyperlipidemia Father   . Heart disease Father   . Hypertension Father   . Diabetes Father   . Stroke Sister   . Stroke Brother    History  Substance Use Topics  . Smoking status: Former Smoker -- 0.50 packs/day for 67 years    Types: Cigarettes    Quit date: 05/31/2008  . Smokeless tobacco: Never Used  . Alcohol Use: Yes     Comment: 12/08/11 "last alcohol was ~ 20 yr ago"    Review of Systems  Unable to perform ROS     Allergies  Bee venom and Penicillins  Home Medications   Current Outpatient Rx  Name  Route  Sig  Dispense  Refill  . albuterol (PROAIR HFA) 108 (90  BASE) MCG/ACT inhaler   Inhalation   Inhale 2 puffs into the lungs every 4 (four) hours as needed for wheezing.   1 Inhaler   6   . albuterol (PROVENTIL) (2.5 MG/3ML) 0.083% nebulizer solution   Nebulization   Take 3 mLs (2.5 mg total) by nebulization every 4 (four) hours.   75 mL   12   . allopurinol (ZYLOPRIM) 300 MG tablet      take 1 tablet by mouth every morning   30 tablet   6   . amLODipine (NORVASC) 5 MG tablet      take 1 tablet by mouth every morning   30 tablet   6   . aspirin 325 MG tablet   Oral   Take 325 mg by mouth every morning.          . cetirizine (ZYRTEC) 10 MG tablet   Oral   Take 10 mg by mouth daily.         Marland Kitchen donepezil (ARICEPT) 5 MG tablet      take 1 tablet by mouth at bedtime   30  tablet   11   . furosemide (LASIX) 40 MG tablet      take 1 tablet by mouth daily **WEIGH DAILY- IF MORE THAN 2 POUND WEIGHT GAIN, TAKE 2 TABLETS THAT MORNING**   30 tablet   5   . glipiZIDE (GLUCOTROL XL) 2.5 MG 24 hr tablet   Oral   Take 1 tablet (2.5 mg total) by mouth daily with breakfast.   30 tablet   6   . ipratropium (ATROVENT) 0.06 % nasal spray      instill 1 spray into each nostril three times a day   15 mL   6   . meclizine (ANTIVERT) 25 MG tablet      take 1 tablet by mouth three times a day for dizziness   90 tablet   0   . metoprolol (LOPRESSOR) 100 MG tablet   Oral   Take 50 mg by mouth 2 (two) times daily.         Marland Kitchen NAMENDA 10 MG tablet      take 1 tablet by mouth twice a day   60 tablet   5   . simvastatin (ZOCOR) 10 MG tablet      take 1 tablet by mouth at bedtime   90 tablet   1   . SPIRIVA HANDIHALER 18 MCG inhalation capsule      inhale contents of 1 capsule by mouth once daily   30 capsule   6   . SYMBICORT 160-4.5 MCG/ACT inhaler      inhale 2 puffs by mouth twice a day   10.2 g   2   . tamsulosin (FLOMAX) 0.4 MG CAPS capsule      take 1 capsule by mouth once daily   30 capsule   5   .  thiamine (VITAMIN B-1) 100 MG tablet   Oral   Take 100 mg by mouth daily.         . valsartan-hydrochlorothiazide (DIOVAN-HCT) 320-25 MG per tablet      take 1 tablet by mouth once daily   30 tablet   5   . glucose blood test strip      Test twice daily as directed.  Dx 250.42   100 each   12   . ONETOUCH DELICA LANCETS 33G MISC      use twice a day as directed   100 each   2    BP 132/62  Pulse 80  Temp(Src) 101.1 F (38.4 C) (Oral)  Resp 24  Ht 5\' 5"  (1.651 m)  Wt 185 lb (83.915 kg)  BMI 30.79 kg/m2  SpO2 99% Physical Exam  Nursing note and vitals reviewed. Constitutional: He is oriented to person, place, and time. He appears well-developed and well-nourished.  HENT:  Head: Normocephalic and atraumatic.  Eyes: EOM are normal.  Neck: Normal range of motion.  Cardiovascular: Normal rate, regular rhythm, normal heart sounds and intact distal pulses.   Pulmonary/Chest: Effort normal. No respiratory distress.  Rhonchi in left base  Abdominal: Soft. He exhibits no distension. There is no tenderness.  Musculoskeletal: Normal range of motion.  Neurological: He is alert and oriented to person, place, and time.  Skin: Skin is warm and dry.  Psychiatric: He has a normal mood and affect. Judgment normal.    ED Course  Procedures (including critical care time) Labs Review Labs Reviewed  CBC WITH DIFFERENTIAL - Abnormal; Notable for the following:    RBC 3.77 (*)    Hemoglobin  11.8 (*)    HCT 35.4 (*)    RDW 15.6 (*)    Neutrophils Relative % 80 (*)    Lymphocytes Relative 9 (*)    All other components within normal limits  I-STAT CG4 LACTIC ACID, ED - Abnormal; Notable for the following:    Lactic Acid, Venous 2.31 (*)    All other components within normal limits  CULTURE, BLOOD (ROUTINE X 2)  CULTURE, BLOOD (ROUTINE X 2)  COMPREHENSIVE METABOLIC PANEL  URINALYSIS, ROUTINE W REFLEX MICROSCOPIC  TROPONIN I   Imaging Review Dg Chest Port 1 View  07/09/2013    CLINICAL DATA:  Altered mental status, evaluate for pneumonia  EXAM: PORTABLE CHEST - 1 VIEW  COMPARISON:  DG CHEST 2 VIEW dated 05/12/2013; DG CHEST 2 VIEW dated 08/21/2012; DG CHEST 2 VIEW dated 02/13/2012; DG CHEST 2 VIEW dated 02/14/2011  FINDINGS: Grossly unchanged enlarged cardiac silhouette and mediastinal contours post median sternotomy. Pulmonary venous congestion without frank evidence of edema. Grossly unchanged bibasilar heterogeneous opacities, left greater than right, likely atelectasis. Trace bilateral effusions are not excluded. No pneumothorax. Grossly unchanged bones.  IMPRESSION: 1. No definite acute cardiopulmonary disease on this AP portable examination. Further evaluation with a PA and lateral chest radiograph may be obtained as clinically indicated. 2. Similar findings of cardiomegaly, pulmonary venous congestion and bibasilar opacities, likely atelectasis.   Electronically Signed   By: Simonne ComeJohn  Watts M.D.   On: 07/03/2013 08:05  I personally reviewed the imaging tests through PACS system I reviewed available ER/hospitalization records through the EMR    EKG Interpretation None      MDM   Final diagnoses:  None    On arrival to the emergency department with altered mental status.  Rhonchi on left lung fields.  I suspect the patient is developing pneumonia with productive cough.  Patient be treated for community-acquired pneumonia with Rocephin and azithromycin and admitted to the hospital for altered mental status and suspected CAP.  Initial x-ray was poor quality.  Patient be sent back for 2 view chest xray    Lyanne CoKevin M Kade Rickels, MD 07/07/2013 [phone]

## 2013-07-28 NOTE — ED Notes (Addendum)
Pt has no complaints.  Son feels pt is not himself and wants him checked out.  Pt answers appropriately most questions placed to him.  Denies pain, cough.  Increased temp noted.

## 2013-07-28 NOTE — Evaluation (Addendum)
Physical Therapy Evaluation Patient Details Name: Lucas EavesRufus Obrien MRN: [id number] DOB: February 24, 1925 Today's Date: 07/25/2013 Time: [id number] PT Time Calculation (min): 23 min  PT Assessment / Plan / Recommendation History of Present Illness  Patient is an 80 yo male with a history of hypertension, coronary disease status post CABG, underlying dementia, COPD, that presents to the emergency department with complaints of altered mental status and shortness of breath. Patient with sepsis vs CAP, resp failure with hypoxia, and acute encephalopathy.  To ICU on BiPap.  Clinical Impression  Patient presents with problems listed below.  Will benefit from acute PT to maximize independence prior to discharge home with son.  Patient very deconditioned from ICU stay. Recommend HHPT (if patient progresses to min guard level to allow him to return home.  If requires more assist, may need to consider ST-SNF for additional therapy prior to discharge home.)    PT Assessment  Patient needs continued PT services    Follow Up Recommendations  Home health PT;Supervision/Assistance - 24 hour (If patient progresses to min guard A level)    Does the patient have the potential to tolerate intense rehabilitation      Barriers to Discharge        Equipment Recommendations  Rolling walker with 5" wheels;3in1 (PT)    Recommendations for Other Services     Frequency Min 3X/week    Precautions / Restrictions Precautions Precautions: Fall Precaution Comments: uses O2 at 2 l/min at home Restrictions Weight Bearing Restrictions: No   Pertinent Vitals/Pain       Mobility  Bed Mobility Overal bed mobility: Needs Assistance Bed Mobility: Supine to Sit Supine to sit: Mod assist General bed mobility comments: Verbal and tactile cues for technique.  Assist to raise trunk to sitting position.  Once upright, able to maintain balance with supervision.  Mod assist to scoot to EOB. Transfers Overall transfer level:  Needs assistance Equipment used: 1 person hand held assist Transfers: Sit to/from Stand Sit to Stand: Mod assist General transfer comment: Verbal cues for technique.  Patient stood with mod assist, and required mod assist to maintain balance.  Patient with posterior lean, with LE's against bed.    Exercises     PT Diagnosis: Difficulty walking;Generalized weakness;Altered mental status  PT Problem List: Decreased strength;Decreased activity tolerance;Decreased balance;Decreased mobility;Decreased cognition;Decreased knowledge of use of DME;Decreased safety awareness;Cardiopulmonary status limiting activity PT Treatment Interventions: DME instruction;Gait training;Functional mobility training;Stair training;Therapeutic exercise;Patient/family education     PT Goals(Current goals can be found in the care plan section) Acute Rehab PT Goals Patient Stated Goal: To go home PT Goal Formulation: With patient Time For Goal Achievement: 08/23/2013 Potential to Achieve Goals: Fair  Visit Information  Last PT Received On: 07/11/2013 Assistance Needed: +2 History of Present Illness: Patient is an 80 yo male with a history of hypertension, coronary disease status post CABG, underlying dementia, COPD, that presents to the emergency department with complaints of altered mental status and shortness of breath. Patient with sepsis vs CAP, resp failure with hypoxia, and acute encephalopathy.  To ICU on BiPap.       Prior Functioning  Home Living Family/patient expects to be discharged to:: Private residence Living Arrangements: Children (Son) Available Help at Discharge: Family;Personal care attendant;Available 24 hours/day Type of Home: House Home Access: Stairs to enter Entergy CorporationEntrance Stairs-Number of Steps: couple Entrance Stairs-Rails: Right Home Layout: One level Home Equipment: Other (comment) (Unsure - patient not reliable) Additional Comments: Information from chart.  Patient states he lives on  his  own. Prior Function Level of Independence: Needs assistance Gait / Transfers Assistance Needed: Unsure ADL's / Homemaking Assistance Needed: Assist with bathing, dressing, meals Communication Communication: No difficulties Dominant Hand: Right    Cognition  Cognition Arousal/Alertness: Awake/alert Behavior During Therapy: Flat affect Overall Cognitive Status: No family/caregiver present to determine baseline cognitive functioning Area of Impairment: Orientation;Attention;Memory;Safety/judgement;Problem solving Orientation Level: Disoriented to;Time;Situation Current Attention Level: Sustained Memory: Decreased short-term memory Safety/Judgement: Decreased awareness of deficits;Decreased awareness of safety Problem Solving: Slow processing;Decreased initiation;Difficulty sequencing;Requires verbal cues    Extremity/Trunk Assessment Upper Extremity Assessment Upper Extremity Assessment: Generalized weakness Lower Extremity Assessment Lower Extremity Assessment: Generalized weakness   Balance Balance Overall balance assessment: Needs assistance Sitting-balance support: No upper extremity supported;Feet supported Sitting balance-Leahy Scale: Good Standing balance support: Bilateral upper extremity supported Standing balance-Leahy Scale: Poor Standing balance comment: Patient with LE's against bed to assist with balance.  Unable to step away from bed.  Flexed posture, requiring mod assist to remain upright.  End of Session PT - End of Session Equipment Utilized During Treatment: Gait belt;Oxygen Activity Tolerance: Patient limited by fatigue Patient left: in bed;with call bell/phone within reach;with bed alarm set (EOB to eat dinner) Nurse Communication: Mobility status (Patient at EOB with bed alarm on)  GP     Vena Austria 07/22/2013, 5:49 PM Durenda Hurt. Renaldo Fiddler, [facility] Acute Rehab Services Pager [phone]

## 2013-07-28 NOTE — H&P (Signed)
[facility] History and Physical  Lucas Obrien ZOX:[id number] DOB: Dec 06, 1924 DOA: 07/27/2013  Referring physician:  PCP: Neena Rhymes, MD  Specialists:   Chief Complaint: Altered mental status, shortness of breath  HPI: Lucas Obrien is a 80 y.o. male  With a history of hypertension, coronary disease status post CABG, underlying dementia, COPD, that presents to the emergency department with complaints of altered mental status and shortness of breath. Patient is accompanied by his son. Patient's son states that at approximately 5 AM this morning, the patient woke up and seemed to be different from his normal baseline. Patient does have baseline however had increased confusion this morning. He was also noted to have a productive cough with some shortness of breath.  Patient does have history of COPD and wears 2 L of oxygen via nasal cannula at home. Per his son, his oxygen saturations at home while on oxygen was in the 80s.  Prednisone, patient had no complaints last few days. The son was sick a couple of days ago with an upper respiratory symptoms. Per the son, the patient has received 1 vaccination.  Nebulizer treatments were given, however this did not resolve patient shortness of breath.  In the emergency department, patient was found to have fever 101.43F. He also was found to have a lactic acid level 2.31 and was tachypneic. Patient was given DuoNeb treatment in the emergency department. Placed on 5 L of oxygen via nasal cannula.  Initial chest x-ray did not show any acute cardiopulmonary disease.  Review of Systems:  Patient currently has altered mental status, review of systems unobtainable.  Past Medical History  Diagnosis Date  . Hypertension   . Hyperlipidemia   . Urine incontinence   . Alcohol problem drinking   . Emphysema of lung   . Type II diabetes mellitus   . Chronic kidney disease     "moderate"  . Arthritis     "all over"  . Gout   . Dementia   . UTI  (lower urinary tract infection) 12/08/2011    "first one"  . Shortness of breath   . Pneumonia 08/21/2012  . Coronary artery disease   . Dysrhythmia   . Oxygen dependent 2L  . Glaucoma   . Dementia    Past Surgical History  Procedure Laterality Date  . Coronary artery bypass graft  1994   Social History:  reports that he quit smoking about 5 years ago. His smoking use included Cigarettes. He has a 33.5 pack-year smoking history. He has never used smokeless tobacco. He reports that he drinks alcohol. He reports that he does not use illicit drugs. Currently lives at home with his son.  Allergies  Allergen Reactions  . Bee Venom     Unknown  . Penicillins     Unknown reaction    Family History  Problem Relation Age of Onset  . Arthritis Mother   . Cancer Father   . Hyperlipidemia Father   . Heart disease Father   . Hypertension Father   . Diabetes Father   . Stroke Sister   . Stroke Brother     Prior to Admission medications   Medication Sig Start Date End Date Taking? Authorizing Provider  albuterol (PROAIR HFA) 108 (90 BASE) MCG/ACT inhaler Inhale 2 puffs into the lungs every 4 (four) hours as needed for wheezing. 01/16/13  Yes Sheliah Hatch, MD  albuterol (PROVENTIL) (2.5 MG/3ML) 0.083% nebulizer solution Take 3 mLs (2.5 mg total) by nebulization every 4 (four)  hours. 05/12/13 07/09/2013 Yes Gerhard Munch, MD  allopurinol (ZYLOPRIM) 300 MG tablet take 1 tablet by mouth every morning 03/01/13  Yes Sheliah Hatch, MD  amLODipine (NORVASC) 5 MG tablet take 1 tablet by mouth every morning 03/01/13  Yes Sheliah Hatch, MD  aspirin 325 MG tablet Take 325 mg by mouth every morning.    Yes Historical Provider, MD  cetirizine (ZYRTEC) 10 MG tablet Take 10 mg by mouth daily.   Yes Historical Provider, MD  donepezil (ARICEPT) 5 MG tablet take 1 tablet by mouth at bedtime 10/14/12  Yes Sheliah Hatch, MD  furosemide (LASIX) 40 MG tablet take 1 tablet by mouth daily **WEIGH  DAILY- IF MORE THAN 2 POUND WEIGHT GAIN, TAKE 2 TABLETS THAT MORNING** 03/24/13  Yes Sheliah Hatch, MD  glipiZIDE (GLUCOTROL XL) 2.5 MG 24 hr tablet Take 1 tablet (2.5 mg total) by mouth daily with breakfast. 04/18/13  Yes Sheliah Hatch, MD  ipratropium (ATROVENT) 0.06 % nasal spray instill 1 spray into each nostril three times a day 12/30/12  Yes Sheliah Hatch, MD  meclizine (ANTIVERT) 25 MG tablet take 1 tablet by mouth three times a day for dizziness   Yes Sheliah Hatch, MD  metoprolol (LOPRESSOR) 100 MG tablet Take 50 mg by mouth 2 (two) times daily.   Yes Historical Provider, MD  NAMENDA 10 MG tablet take 1 tablet by mouth twice a day 05/12/13  Yes Sheliah Hatch, MD  simvastatin (ZOCOR) 10 MG tablet take 1 tablet by mouth at bedtime 03/18/13  Yes Sheliah Hatch, MD  [employer] HANDIHALER 18 MCG inhalation capsule inhale contents of 1 capsule by mouth once daily 04/19/13  Yes Sheliah Hatch, MD  [facility] 160-4.5 MCG/ACT inhaler inhale 2 puffs by mouth twice a day 06/15/13  Yes Sheliah Hatch, MD  tamsulosin [facility]) 0.4 MG CAPS capsule take 1 capsule by mouth once daily 03/03/13  Yes Sheliah Hatch, MD  thiamine (VITAMIN B-1) 100 MG tablet Take 100 mg by mouth daily.   Yes Historical Provider, MD  valsartan-hydrochlorothiazide (DIOVAN-HCT) 320-25 MG per tablet take 1 tablet by mouth once daily 03/10/13  Yes Sheliah Hatch, MD  glucose blood test strip Test twice daily as directed.  Dx 250.42 10/03/12   Sheliah Hatch, MD  [facility] DELICA LANCETS 33G MISC use twice a day as directed    Sheliah Hatch, MD   Physical Exam: Filed Vitals:   07/06/2013 0917  BP:   Pulse: 81  Temp:   Resp: 22     General: Well developed, well nourished, NAD, appears stated age  HEENT: NCAT, PERRLA, EOMI, Anicteic Sclera, mucous membranes moist.   Neck: Supple, no JVD, no masses,  Cardiovascular: S1 S2 auscultated, no rubs, murmurs or gallops. Regular rate and  rhythm.  Respiratory: Diminished breath sounds noted in the lower lung fields, normal inspiratory effort, scattered expiratory wheezing.  Abdomen: Soft, nontender, nondistended, + bowel sounds  Extremities: warm dry without cyanosis clubbing or edema  Neuro: Awake and alert, not oriented to place. Cranial nerves grossly intact. Strength 5/5 in patient's upper and lower extremities bilaterally  Skin: Without rashes exudates or nodules  Psych: Normal affect and demeanor.  Labs on Admission:  Basic Metabolic Panel:  Recent Labs Lab 07/14/2013 0800  NA 143  K 4.1  CL 100  CO2 28  GLUCOSE 152*  BUN 46*  CREATININE 2.16*  CALCIUM 10.2   Liver Function Tests:  Recent Labs  Lab 07/22/2013 0800  AST 30  ALT 21  ALKPHOS 65  BILITOT 0.2*  PROT 8.9*  ALBUMIN 3.8   No results found for this basename: LIPASE, AMYLASE,  in the last 168 hours No results found for this basename: AMMONIA,  in the last 168 hours CBC:  Recent Labs Lab 07/15/2013 0800  WBC 7.6  NEUTROABS 6.1  HGB 11.8*  HCT 35.4*  MCV 93.9  PLT 155   Cardiac Enzymes:  Recent Labs Lab 07/03/2013 0800  TROPONINI <0.30    BNP (last 3 results)  Recent Labs  09/20/12 1325 01/16/13 1152 05/12/13 1445  PROBNP 7131.0* 87.0 455.2*   CBG: No results found for this basename: GLUCAP,  in the last 168 hours  Radiological Exams on Admission: Dg Chest 2 View  07/13/2013   CLINICAL DATA:  Fever  EXAM: CHEST - 2 VIEW  COMPARISON:  Earlier film of the same day  FINDINGS: Previous median sternotomy. Mild cardiomegaly. Tortuous atheromatous aorta. Coarse interstitial and airspace opacities in both lung bases left greater than right, probably largely chronic. Similar opacities seen on films dating back to 02/13/2012. Marland Kitchen. No effusion.  IMPRESSION: Bibasilar coarse opacities, left greater than right, probably largely chronic. No definite acute or superimposed abnormality.   Electronically Signed   By: Oley Balmaniel  Hassell M.D.   On:  07/27/2013 09:29   Dg Chest Port 1 View  07/03/2013   CLINICAL DATA:  Altered mental status, evaluate for pneumonia  EXAM: PORTABLE CHEST - 1 VIEW  COMPARISON:  DG CHEST 2 VIEW dated 05/12/2013; DG CHEST 2 VIEW dated 08/21/2012; DG CHEST 2 VIEW dated 02/13/2012; DG CHEST 2 VIEW dated 02/14/2011  FINDINGS: Grossly unchanged enlarged cardiac silhouette and mediastinal contours post median sternotomy. Pulmonary venous congestion without frank evidence of edema. Grossly unchanged bibasilar heterogeneous opacities, left greater than right, likely atelectasis. Trace bilateral effusions are not excluded. No pneumothorax. Grossly unchanged bones.  IMPRESSION: 1. No definite acute cardiopulmonary disease on this AP portable examination. Further evaluation with a PA and lateral chest radiograph may be obtained as clinically indicated. 2. Similar findings of cardiomegaly, pulmonary venous congestion and bibasilar opacities, likely atelectasis.   Electronically Signed   By: Simonne ComeJohn  Watts M.D.   On: 07/16/2013 08:05    EKG: Independently reviewed. Sinus rhythm, rate 82, first degree AV block, PR 255  Assessment/Plan Sepsis to possible community-acquired pneumonia Patient be admitted to medical floor. He does have a lactic acid slightly elevated at 2.31 along with tachypnea and he is febrile with temperature 101.1. Although initial chest x-ray did not show any acute changes, pending further x-rays. He has rhonchorous sounds on exam. Will cover for community-acquired pneumonia. Due to his penicillin allergy, patient be placed on Levaquin. Will also place patient on DuoNeb treatments and guaifenesin for cough suppressant. Will also obtain urine strep pneumonia as well as Legionella antigens.  Currently pending urinalysis and culture, as well as a sputum culture and Gram stain. Will place patient on oxygen to maintain his sats above 92%. Will obtain an influenza PCR.  Acute respiratory failure with hypoxia Possibly secondary  to community-acquired pneumonia versus COPD exacerbation.  Treatment and plan as stated above.  We'll continue  Acute encephalopathy with underlying dementia Likely secondary to infectious etiology. 14 vitamin B12, folate, TSH level. Will continue patient on his Namenda, aricept, as well as thiamine.  Acute on chronic kidney failure, stage II Patient's baseline creatinine is 1.7 20.8. At this time is 2.16.  Will provide gentle rehydration BMP.  Possibly secondary to medications versus dehydration.  Hypertension Will hold patient's Lasix and Diovan HCT do 2 acute kidney injury. Will continue patient on amlodipine and metoprolol.  COPD Will continue Spiriva and symbicort.  Gout Stable, Will hold allopurinol due to AKI.    History of coronary artery disease Currently chest pain free.  Will continue his medications of metoprolol, simvastatin, amlodipine, aspirin. Will hold valsartan due to AKI.  ?CHF Echocardiogram in April 2014 shows an EF of 55% with normal systolic function. No mention of diastolic dysfunction. However patient's BNP currently mildly elevated at 400. Daily weights, monitor his input and output as well as place him on fluid restriction.  Diabetes mellitus, type II Will hold glipizide. Will place him on insulin sliding scale as well as CBG monitoring.  Vertigo Continue patient's meclizine.  DVT prophylaxis: Lovenox  Code Status: Full  Condition: Guarded  Family Communication: Son at bedside. Admission, patients condition and plan of care including tests being ordered have been discussed with the patient and son who indicate understanding and agree with the plan and Code Status.  Disposition Plan: Admitted   Time spent: 60 minutes  Navpreet Szczygiel D.O. [facility] Pager [phone]  If 7PM-7AM, please contact night-coverage [email or web address] Password [facility] 07/25/2013, 10:29 AM

## 2013-07-28 NOTE — Progress Notes (Signed)
Pt was given sputum cup, pt spitting nasal/sinus drainage into cup (not coughed up from lung).  Sputum cup discarded, and RN aware and pt given new cup.

## 2013-07-28 NOTE — ED Notes (Signed)
Istat Lactic Acid results reported to Dr.Campos. ED-Lab

## 2013-07-28 NOTE — Progress Notes (Signed)
Sputum cup placed in room explained to pt./family member/labelled, RN made aware.

## 2013-07-28 NOTE — ED Notes (Signed)
Called 6E to give report. Receiving RN unable to take report at this time. 

## 2013-07-29 DIAGNOSIS — J449 Chronic obstructive pulmonary disease, unspecified: Secondary | ICD-10-CM

## 2013-07-29 DIAGNOSIS — R42 Dizziness and giddiness: Secondary | ICD-10-CM

## 2013-07-29 LAB — CBC
HCT: 31.3 % — ABNORMAL LOW (ref 39.0–52.0)
Hemoglobin: 10.1 g/dL — ABNORMAL LOW (ref 13.0–17.0)
MCH: 30.2 pg (ref 26.0–34.0)
MCHC: 32.3 g/dL (ref 30.0–36.0)
MCV: 93.7 fL (ref 78.0–100.0)
Platelets: 132 10*3/uL — ABNORMAL LOW (ref 150–400)
RBC: 3.34 MIL/uL — AB (ref 4.22–5.81)
RDW: 15.9 % — AB (ref 11.5–15.5)
WBC: 6.5 10*3/uL (ref 4.0–10.5)

## 2013-07-29 LAB — BASIC METABOLIC PANEL
BUN: 36 mg/dL — AB (ref 6–23)
CO2: 24 meq/L (ref 19–32)
Calcium: 9.3 mg/dL (ref 8.4–10.5)
Chloride: 106 mEq/L (ref 96–112)
Creatinine, Ser: 1.92 mg/dL — ABNORMAL HIGH (ref 0.50–1.35)
GFR calc Af Amer: 35 mL/min — ABNORMAL LOW (ref >90)
GFR calc non Af Amer: 30 mL/min — ABNORMAL LOW (ref >90)
GLUCOSE: 114 mg/dL — AB (ref 70–99)
Potassium: 4.1 mEq/L (ref 3.7–5.3)
Sodium: 144 mEq/L (ref 137–147)

## 2013-07-29 LAB — GLUCOSE, CAPILLARY
GLUCOSE-CAPILLARY: 108 mg/dL — AB (ref 70–99)
GLUCOSE-CAPILLARY: 121 mg/dL — AB (ref 70–99)
Glucose-Capillary: 233 mg/dL — ABNORMAL HIGH (ref 70–99)
Glucose-Capillary: 136 mg/dL — ABNORMAL HIGH (ref 70–99)

## 2013-07-29 LAB — URINE CULTURE
Colony Count: NO GROWTH
Culture: NO GROWTH

## 2013-07-29 LAB — LACTIC ACID, PLASMA: Lactic Acid, Venous: 0.9 mmol/L (ref 0.5–2.2)

## 2013-07-29 MED ORDER — WHITE PETROLATUM GEL
Status: AC
  Administered 2013-07-29: 0.2
  Filled 2013-07-29: qty 5

## 2013-07-29 MED ORDER — IPRATROPIUM-ALBUTEROL 0.5-2.5 (3) MG/3ML IN SOLN
3.0000 mL | Freq: Four times a day (QID) | RESPIRATORY_TRACT | Status: DC
  Administered 2013-07-29 – 2013-07-30 (×6): 3 mL via RESPIRATORY_TRACT
  Filled 2013-07-29 (×6): qty 3

## 2013-07-29 MED ORDER — LEVOFLOXACIN IN D5W 750 MG/150ML IV SOLN
750.0000 mg | INTRAVENOUS | Status: DC
  Filled 2013-07-29: qty 150

## 2013-07-29 MED ORDER — ACETAMINOPHEN 325 MG PO TABS
650.0000 mg | ORAL_TABLET | Freq: Four times a day (QID) | ORAL | Status: DC | PRN
  Administered 2013-07-29 – 2013-07-31 (×3): 650 mg via ORAL
  Filled 2013-07-29 (×3): qty 2

## 2013-07-29 NOTE — Progress Notes (Signed)
Temp 103. Called to MD. Orders received. Continue to monitor. Hortencia ConradiWendi Teniyah Seivert, RN

## 2013-07-29 NOTE — Progress Notes (Signed)
[facility]                                                                              Patient Demographics  Lucas EavesRufus Suk, is a 80 y.o. male, DOB - [birth year], ZOX:[id number]  Admit date - 03-31-14   Admitting Physician Edsel PetrinMaryann Branda Chaudhary, DO  Outpatient Primary MD for the patient is Neena RhymesKatherine Tabori, MD  LOS - 1   Chief Complaint  Patient presents with  . Fever        Assessment & Plan   Sepsis to possible community-acquired pneumonia and Influenza A -Patient continues to be febrile -Lactic acid 0.9 -CXR: Bibasilar coarse opacities, left greater than right -Influenza A positive -Pending sputum culture and gram stain -UA negative, urine strep pneumonia negative, pending legionella urine Ag -Continue levaquin and tamiflu along with supplemental oxygen, duonebs  Acute respiratory failure with hypoxia  -Multifactorial: community-acquired pneumonia versus COPD exacerbation versus influenza -treatment and plan as stated above.  Acute encephalopathy with underlying dementia  -Likely secondary to infectious etiology. C -Continue Namenda, aricept, as well as thiamine.  -TSH 2.311, Vit B12 493, folate pending  Acute on chronic kidney failure, stage II  -Improving, Cr 1.92, baseline creatinine is 1.7.  -Possibly secondary to medications versus dehydration.   Hypertension  -Holding Lasix and Diovan HCT due to acute kidney injury.  -Continue amlodipine and metoprolol.   COPD  -Continue Spiriva and symbicort.   Gout  -Stable, Will hold allopurinol due to AKI.   History of coronary artery disease  -Currently chest pain free.  -Continue metoprolol, simvastatin, amlodipine, aspirin. -Valsartan held due to AKI   ?CHF  -Echocardiogram in April 2014 shows an EF of 55% with normal systolic function. No mention of diastolic dysfunction.  -BNP mildly elevated at 400.  -Continue daily weights, strict input and output, and fluid restriction   Diabetes mellitus, type  II  -Glipizide held.  -Continue insulin sliding scale as well as CBG monitoring.   Vertigo  -Continue meclizine  Code Status: Full  Family Communication: Son at bedside  Disposition Plan: Admitted  Time Spent in minutes   30 minutes  Procedures None  Consults  None  DVT Prophylaxis  Lovenox   Lab Results  Component Value Date   PLT 132* 07/29/2013    Medications  Scheduled Meds: . amLODipine  5 mg Oral Daily  . aspirin  325 mg Oral q morning - 10a  . budesonide-formoterol  2 puff Inhalation BID  . donepezil  5 mg Oral QHS  . enoxaparin (LOVENOX) injection  30 mg Subcutaneous Q24H  . insulin aspart  0-15 Units Subcutaneous TID WC  . insulin aspart  0-5 Units Subcutaneous QHS  . ipratropium-albuterol  3 mL Nebulization Q6H  . levofloxacin (LEVAQUIN) IV  750 mg Intravenous Q24H  . loratadine  10 mg Oral Daily  . memantine  10 mg Oral BID  . metoprolol  50 mg Oral BID  . oseltamivir  30 mg Oral Daily  . simvastatin  10 mg Oral Daily  . tamsulosin  0.4 mg Oral Daily  . thiamine  100 mg Oral Daily  . tiotropium  18 mcg Inhalation Daily   Continuous Infusions: . sodium  chloride 75 mL/hr at 08/08/2013 2300   PRN Meds:.acetaminophen, guaifenesin, meclizine  Antibiotics    Anti-infectives   Start     Dose/Rate Route Frequency Ordered Stop   2013/08/08 1600  oseltamivir (TAMIFLU) capsule 30 mg     30 mg Oral Daily 08/08/13 1556 08/05/2013 0959   August 08, 2013 1130  levofloxacin (LEVAQUIN) IVPB 750 mg     750 mg 100 mL/hr over 90 Minutes Intravenous Every 24 hours 2013/08/08 1124 08/28/2013 1129   August 08, 2013 0745  cefTRIAXone (ROCEPHIN) 1 g in dextrose 5 % 50 mL IVPB     1 g 100 mL/hr over 30 Minutes Intravenous  Once 08/08/13 0730 08-08-13 0822   Aug 08, 2013 0745  azithromycin (ZITHROMAX) 500 mg in dextrose 5 % 250 mL IVPB     500 mg 250 mL/hr over 60 Minutes Intravenous  Once 08/08/2013 0730 08-Aug-2013 1016        Subjective:   Lucas Obrien seen and examined today.  Patient has  no complaints.  Denies SOB, however is coughing.  Continues to be confused as per son at bedside.  Objective:   Filed Vitals:   August 08, 2013 2032 07/29/13 0036 07/29/13 0512 07/29/13 0728  BP: 129/44  141/49   Pulse: 71  85   Temp: 98.8 F (37.1 C)  102.9 F (39.4 C) 103 F (39.4 C)  TempSrc: Oral  Oral   Resp: 28  20   Height:      Weight:      SpO2: 97% 98% 94%     Wt Readings from Last 3 Encounters:  08-08-2013 83.915 kg (185 lb)  05/16/13 84.539 kg (186 lb 6 oz)  04/18/13 86.694 kg (191 lb 2 oz)     Intake/Output Summary (Last 24 hours) at 07/29/13 0818 Last data filed at 07/29/13 0800  Gross per 24 hour  Intake   1470 ml  Output    975 ml  Net    495 ml    Exam General: Well developed, well nourished, NAD, appears stated age  HEENT: NCAT, PERRLA, EOMI, Anicteic Sclera, mucous membranes moist.  Neck: Supple, no JVD, no masses  Cardiovascular: S1 S2 auscultated, no rubs, murmurs or gallops. Regular rate and rhythm.  Respiratory: Diminished breath sounds noted in the lower lung fields, normal inspiratory effort, scattered expiratory wheezing.  Abdomen: Soft, nontender, nondistended, + bowel sounds  Extremities: warm dry without cyanosis clubbing or edema  Neuro: Awake and alert, not oriented to place. Cranial nerves grossly intact. Strength 5/5 in patient's upper and lower extremities bilaterally  Skin: Without rashes exudates or nodules  Psych: Normal affect and demeanor, pleasant  Data Review   Micro Results No results found for this or any previous visit (from the past 240 hour(s)).  Radiology Reports Dg Chest 2 View  2013/08/08   CLINICAL DATA:  Fever  EXAM: CHEST - 2 VIEW  COMPARISON:  Earlier film of the same day  FINDINGS: Previous median sternotomy. Mild cardiomegaly. Tortuous atheromatous aorta. Coarse interstitial and airspace opacities in both lung bases left greater than right, probably largely chronic. Similar opacities seen on films dating back to  02/13/2012. Marland Kitchen No effusion.  IMPRESSION: Bibasilar coarse opacities, left greater than right, probably largely chronic. No definite acute or superimposed abnormality.   Electronically Signed   By: Oley Balm M.D.   On: 08-Aug-2013 09:29   Dg Chest Port 1 View  2013/08/08   CLINICAL DATA:  Altered mental status, evaluate for pneumonia  EXAM: PORTABLE CHEST - 1 VIEW  COMPARISON:  DG CHEST 2 VIEW dated 05/12/2013; DG CHEST 2 VIEW dated 08/21/2012; DG CHEST 2 VIEW dated 02/13/2012; DG CHEST 2 VIEW dated 02/14/2011  FINDINGS: Grossly unchanged enlarged cardiac silhouette and mediastinal contours post median sternotomy. Pulmonary venous congestion without frank evidence of edema. Grossly unchanged bibasilar heterogeneous opacities, left greater than right, likely atelectasis. Trace bilateral effusions are not excluded. No pneumothorax. Grossly unchanged bones.  IMPRESSION: 1. No definite acute cardiopulmonary disease on this AP portable examination. Further evaluation with a PA and lateral chest radiograph may be obtained as clinically indicated. 2. Similar findings of cardiomegaly, pulmonary venous congestion and bibasilar opacities, likely atelectasis.   Electronically Signed   By: Simonne Come M.D.   On: 07/31/2013 08:05    CBC  Recent Labs Lab 07/31/13 0800 31-Jul-2013 1310 07/29/13 0622  WBC 7.6 4.8 6.5  HGB 11.8* 9.7* 10.1*  HCT 35.4* 29.5* 31.3*  PLT 155 131* 132*  MCV 93.9 94.2 93.7  MCH 31.3 31.0 30.2  MCHC 33.3 32.9 32.3  RDW 15.6* 15.9* 15.9*  LYMPHSABS 0.7  --   --   MONOABS 0.6  --   --   EOSABS 0.2  --   --   BASOSABS 0.0  --   --     Chemistries   Recent Labs Lab 07-31-13 0800 July 31, 2013 1310 07/29/13 0622  NA 143  --  144  K 4.1  --  4.1  CL 100  --  106  CO2 28  --  24  GLUCOSE 152*  --  114*  BUN 46*  --  36*  CREATININE 2.16* 2.05* 1.92*  CALCIUM 10.2  --  9.3  AST 30  --   --   ALT 21  --   --   ALKPHOS 65  --   --   BILITOT 0.2*  --   --     ------------------------------------------------------------------------------------------------------------------ estimated creatinine clearance is 27.5 ml/min (by C-G formula based on Cr of 1.92). ------------------------------------------------------------------------------------------------------------------  Recent Labs  07-31-2013 1310  HGBA1C 6.5*   ------------------------------------------------------------------------------------------------------------------ No results found for this basename: CHOL, HDL, LDLCALC, TRIG, CHOLHDL, LDLDIRECT,  in the last 72 hours ------------------------------------------------------------------------------------------------------------------  Recent Labs  07/31/2013 1310  TSH 2.311   ------------------------------------------------------------------------------------------------------------------  Recent Labs  July 31, 2013 1310  VITAMINB12 493    Coagulation profile No results found for this basename: INR, PROTIME,  in the last 168 hours  No results found for this basename: DDIMER,  in the last 72 hours  Cardiac Enzymes  Recent Labs Lab 07-31-13 0800  TROPONINI <0.30   ------------------------------------------------------------------------------------------------------------------ No components found with this basename: POCBNP,     Quantia Grullon D.O. on 07/29/2013 at 8:18 AM  Between 7am to 7pm - Pager - [phone]  After 7pm go to [email or web address] - password [email or web address]  And look for the night coverage person covering for me after hours  [facility] Office  [phone]

## 2013-07-29 NOTE — Plan of Care (Signed)
Problem: Phase II Progression Outcomes Goal: Wean O2 if indicated Outcome: Not Applicable Date Met:  47/15/95 Pt is on home O2 at 2L.

## 2013-07-29 NOTE — Plan of Care (Signed)
Problem: Phase I Progression Outcomes Goal: Initial discharge plan identified Outcome: Completed/Met Date Met:  07/29/13 Pt to return home with son.

## 2013-07-29 DEATH — deceased

## 2013-07-30 LAB — BASIC METABOLIC PANEL
BUN: 43 mg/dL — ABNORMAL HIGH (ref 6–23)
CHLORIDE: 102 meq/L (ref 96–112)
CO2: 23 meq/L (ref 19–32)
CREATININE: 2.3 mg/dL — AB (ref 0.50–1.35)
Calcium: 9.1 mg/dL (ref 8.4–10.5)
GFR calc Af Amer: 28 mL/min — ABNORMAL LOW (ref >90)
GFR calc non Af Amer: 24 mL/min — ABNORMAL LOW (ref >90)
Glucose, Bld: 134 mg/dL — ABNORMAL HIGH (ref 70–99)
Potassium: 3.8 mEq/L (ref 3.7–5.3)
Sodium: 138 mEq/L (ref 137–147)

## 2013-07-30 LAB — GLUCOSE, CAPILLARY
GLUCOSE-CAPILLARY: 132 mg/dL — AB (ref 70–99)
GLUCOSE-CAPILLARY: 138 mg/dL — AB (ref 70–99)
Glucose-Capillary: 149 mg/dL — ABNORMAL HIGH (ref 70–99)
Glucose-Capillary: 154 mg/dL — ABNORMAL HIGH (ref 70–99)

## 2013-07-30 LAB — CBC
HCT: 28.9 % — ABNORMAL LOW (ref 39.0–52.0)
Hemoglobin: 9.7 g/dL — ABNORMAL LOW (ref 13.0–17.0)
MCH: 31.2 pg (ref 26.0–34.0)
MCHC: 33.6 g/dL (ref 30.0–36.0)
MCV: 92.9 fL (ref 78.0–100.0)
PLATELETS: 117 10*3/uL — AB (ref 150–400)
RBC: 3.11 MIL/uL — AB (ref 4.22–5.81)
RDW: 16.1 % — ABNORMAL HIGH (ref 11.5–15.5)
WBC: 7.7 10*3/uL (ref 4.0–10.5)

## 2013-07-30 LAB — LEGIONELLA ANTIGEN, URINE: Legionella Antigen, Urine: NEGATIVE

## 2013-07-30 LAB — FOLATE RBC: RBC FOLATE: 382 ng/mL (ref >280)

## 2013-07-30 MED ORDER — IPRATROPIUM-ALBUTEROL 0.5-2.5 (3) MG/3ML IN SOLN
3.0000 mL | Freq: Three times a day (TID) | RESPIRATORY_TRACT | Status: DC
  Administered 2013-07-30 – 2013-07-31 (×2): 3 mL via RESPIRATORY_TRACT
  Filled 2013-07-30 (×2): qty 3

## 2013-07-30 MED ORDER — ACETAMINOPHEN 650 MG RE SUPP
650.0000 mg | RECTAL | Status: DC | PRN
  Administered 2013-07-30: 650 mg via RECTAL

## 2013-07-30 MED ORDER — ACETAMINOPHEN 650 MG RE SUPP
650.0000 mg | Freq: Four times a day (QID) | RECTAL | Status: DC | PRN
  Filled 2013-07-30: qty 1

## 2013-07-30 MED ORDER — SODIUM CHLORIDE 0.9 % IV SOLN
INTRAVENOUS | Status: AC
  Administered 2013-07-30: 08:00:00 via INTRAVENOUS

## 2013-07-30 NOTE — Progress Notes (Signed)
[facility]                                                                              Patient Demographics  Lucas Obrien, is a 80 y.o. male, DOB - Dec 22, 1924, UYQ:[id number]  Admit date - 07/12/2013   Admitting Physician Lucas Petrin, DO  Outpatient Primary MD for the patient is Lucas Rhymes, MD  LOS - 2   Chief Complaint  Patient presents with  . Fever        Assessment & Plan   Sepsis to possible community-acquired pneumonia and Influenza A -Patient continues to be febrile, receiving tyelnol -Lactic acid 0.9 -CXR: Bibasilar coarse opacities, left greater than right -Influenza A positive -Pending sputum culture and gram stain -UA negative, urine strep pneumonia and legionella antigens negative -Continue levaquin and tamiflu along with supplemental oxygen, duonebs  Acute respiratory failure with hypoxia  -Multifactorial: community-acquired pneumonia versus COPD exacerbation versus influenza -treatment and plan as stated above.  Acute encephalopathy with underlying dementia  -Likely secondary to infectious etiology.  -Continue Namenda, aricept, as well as thiamine.  -TSH 2.311, Vit B12 493, folate pending  Acute on chronic kidney failure, stage II  -Cr 2.3, baseline creatinine is 1.7.  -Possibly secondary to medications versus dehydration.  -Will restart IVF at 63mL/hr and continue to monitor  Hypertension  -Holding Lasix and Diovan HCT due to acute kidney injury.  -Continue amlodipine and metoprolol.   COPD  -Continue Spiriva and symbicort.   Gout  -Stable, Will hold allopurinol due to AKI.   History of coronary artery disease  -Currently chest pain free.  -Continue metoprolol, simvastatin, amlodipine, aspirin. -Valsartan held due to AKI   ?CHF  -Echocardiogram in April 2014 shows an EF of 55% with normal systolic function. No mention of diastolic dysfunction.  -BNP mildly elevated at 400.  -Continue daily weights, strict input and  output, and fluid restriction   Diabetes mellitus, type II  -Glipizide held.  -Continue insulin sliding scale as well as CBG monitoring.   Vertigo  -Continue meclizine  Code Status: Full  Family Communication: none at bedside  Disposition Plan: Admitted  Time Spent in minutes   25 minutes  Procedures None  Consults  None  DVT Prophylaxis  Lovenox   Lab Results  Component Value Date   PLT 117* 07/30/2013    Medications  Scheduled Meds: . amLODipine  5 mg Oral Daily  . aspirin  325 mg Oral q morning - 10a  . budesonide-formoterol  2 puff Inhalation BID  . donepezil  5 mg Oral QHS  . enoxaparin (LOVENOX) injection  30 mg Subcutaneous Q24H  . insulin aspart  0-15 Units Subcutaneous TID WC  . insulin aspart  0-5 Units Subcutaneous QHS  . ipratropium-albuterol  3 mL Nebulization Q6H  . [START ON 07/31/2013] levofloxacin (LEVAQUIN) IV  750 mg Intravenous Q48H  . loratadine  10 mg Oral Daily  . memantine  10 mg Oral BID  . metoprolol  50 mg Oral BID  . oseltamivir  30 mg Oral Daily  . simvastatin  10 mg Oral Daily  . tamsulosin  0.4 mg Oral Daily  . thiamine  100 mg Oral Daily  .  tiotropium  18 mcg Inhalation Daily   Continuous Infusions: . sodium chloride     PRN Meds:.acetaminophen, acetaminophen, guaifenesin, meclizine  Antibiotics    Anti-infectives   Start     Dose/Rate Route Frequency Ordered Stop   07/31/13 1130  levofloxacin (LEVAQUIN) IVPB 750 mg     750 mg 100 mL/hr over 90 Minutes Intravenous Every 48 hours 07/29/13 1530 2013/08/30 1129   07/02/2013 1600  oseltamivir (TAMIFLU) capsule 30 mg     30 mg Oral Daily 07/22/2013 1556 08/19/2013 0959   07/25/2013 1130  levofloxacin (LEVAQUIN) IVPB 750 mg  Status:  Discontinued     750 mg 100 mL/hr over 90 Minutes Intravenous Every 24 hours 07/01/2013 1124 07/29/13 1530   07/20/2013 0745  cefTRIAXone (ROCEPHIN) 1 g in dextrose 5 % 50 mL IVPB     1 g 100 mL/hr over 30 Minutes Intravenous  Once 07/19/2013 0730 07/24/2013 0822    07/01/2013 0745  azithromycin (ZITHROMAX) 500 mg in dextrose 5 % 250 mL IVPB     500 mg 250 mL/hr over 60 Minutes Intravenous  Once 07/23/2013 0730 07/12/2013 1016        Subjective:   Lucas Obrien seen and examined today.  Patient has no complaints.  Denies SOB.  Son is not at bedside this morning.  Patient appears to be at his baseline.    Objective:   Filed Vitals:   07/29/13 2045 07/30/13 0151 07/30/13 0518 07/30/13 0703  BP: 127/46  154/54   Pulse: 72  81   Temp: 100.1 F (37.8 C)  103 F (39.4 C) 101.7 F (38.7 C)  TempSrc: Oral  Oral Oral  Resp: 20  18   Height:      Weight: 89.4 kg (197 lb 1.5 oz)     SpO2: 94% 94% 93%     Wt Readings from Last 3 Encounters:  07/29/13 89.4 kg (197 lb 1.5 oz)  05/16/13 84.539 kg (186 lb 6 oz)  04/18/13 86.694 kg (191 lb 2 oz)     Intake/Output Summary (Last 24 hours) at 07/30/13 0807 Last data filed at 07/30/13 0520  Gross per 24 hour  Intake    750 ml  Output    600 ml  Net    150 ml    Exam General: Well developed, well nourished, NAD, appears stated age  HEENT: NCAT, mucous membranes moist.  Neck: Supple, no JVD, no masses  Cardiovascular: S1 S2 auscultated, no rubs, murmurs or gallops. Regular rate and rhythm.  Respiratory: Breath sounds improving, still diminished with some expiratory wheezing Abdomen: Soft, nontender, nondistended, + bowel sounds  Extremities: warm dry without cyanosis clubbing or edema  Neuro: Awake and alert, not oriented to place. No focal deficits Skin: Without rashes exudates or nodules  Psych: Normal affect and demeanor, pleasant  Data Review   Micro Results Recent Results (from the past 240 hour(s))  CULTURE, BLOOD (ROUTINE X 2)     Status: None   Collection Time    07/07/2013  8:00 AM      Result Value Ref Range Status   Specimen Description BLOOD LEFT ARM   Final   Special Requests BOTTLES DRAWN AEROBIC AND ANAEROBIC 10CC   Final   Culture  Setup Time     Final   Value: 07/08/2013  16:44     Performed at [facility]   Culture     Final   Value:        BLOOD CULTURE RECEIVED NO  GROWTH TO DATE CULTURE WILL BE HELD FOR 5 DAYS BEFORE ISSUING A FINAL NEGATIVE REPORT     [facility]   Report Status PENDING   Incomplete  CULTURE, BLOOD (ROUTINE X 2)     Status: None   Collection Time    03/19/2014  1:10 PM      Result Value Ref Range Status   Specimen Description BLOOD RIGHT ARM   Final   Special Requests BOTTLES DRAWN AEROBIC ONLY 10CC   Final   Culture  Setup Time     Final   Value: 01-30-14 23:32     [facility]formed at [facility]   Culture     Final   Value:        BLOOD CULTURE RECEIVED NO GROWTH TO DATE CULTURE WILL BE HELD FOR 5 DAYS BEFORE ISSUING A FINAL NEGATIVE REPORT     [facility]   Report Status PENDING   Incomplete  CULTURE, BLOOD (ROUTINE X 2)     Status: None   Collection Time    03/19/2014  1:10 PM      Result Value Ref Range Status   Specimen Description BLOOD RIGHT ARM   Final   Special Requests BOTTLES DRAWN AEROBIC ONLY 10CC   Final   Culture  Setup Time     Final   Value: 01-30-14 23:32     [facility]formed at [facility]   Culture     Final   Value:        BLOOD CULTURE RECEIVED NO GROWTH TO DATE CULTURE WILL BE HELD FOR 5 DAYS BEFORE ISSUING A FINAL NEGATIVE REPORT     [facility]formed at [facility]   Report Status PENDING   Incomplete  URINE CULTURE     Status: None   Collection Time    03/19/2014  4:14 PM      Result Value Ref Range Status   Specimen Description URINE, RANDOM   Final   Special Requests NONE   Final   Culture  Setup Time     Final   Value: 01-30-14 21:42     [facility]formed [facility]   Colony Count     Final   Value: NO GROWTH     [facility]formed at [facility]   Culture     Final   Value: NO GROWTH     [facility]formed at [facility]   Report Status 07/29/2013 FINAL   Final    Radiology Reports Dg Chest 2 View  Apr 13, 2014   CLINICAL  DATA:  Fever  EXAM: CHEST - 2 VIEW  COMPARISON:  Earlier film of the same day  FINDINGS: Previous median sternotomy. Mild cardiomegaly. Tortuous atheromatous aorta. Coarse interstitial and airspace opacities in both lung bases left greater than right, probably largely chronic. Similar opacities seen on films dating back to 02/13/2012. Marland Kitchen. No effusion.  IMPRESSION: Bibasilar coarse opacities, left greater than right, probably largely chronic. No definite acute or superimposed abnormality.   Electronically Signed   By: Oley Balmaniel  Hassell M.D.   On: 01-30-14 09:29   Dg Chest Port 1 View  Apr 13, 2014   CLINICAL DATA:  Altered mental status, evaluate for pneumonia  EXAM: PORTABLE CHEST - 1 VIEW  COMPARISON:  DG CHEST 2 VIEW dated 05/12/2013; DG CHEST 2 VIEW dated 08/21/2012; DG CHEST 2 VIEW dated 02/13/2012; DG CHEST 2 VIEW dated 02/14/2011  FINDINGS: Grossly unchanged enlarged cardiac silhouette and mediastinal contours post median  sternotomy. Pulmonary venous congestion without frank evidence of edema. Grossly unchanged bibasilar heterogeneous opacities, left greater than right, likely atelectasis. Trace bilateral effusions are not excluded. No pneumothorax. Grossly unchanged bones.  IMPRESSION: 1. No definite acute cardiopulmonary disease on this AP portable examination. Further evaluation with a PA and lateral chest radiograph may be obtained as clinically indicated. 2. Similar findings of cardiomegaly, pulmonary venous congestion and bibasilar opacities, likely atelectasis.   Electronically Signed   By: Simonne Come M.D.   On: 07/20/2013 08:05    CBC  Recent Labs Lab 07/08/2013 0800 07/10/2013 1310 07/29/13 0622 07/30/13 0415  WBC 7.6 4.8 6.5 7.7  HGB 11.8* 9.7* 10.1* 9.7*  HCT 35.4* 29.5* 31.3* 28.9*  PLT 155 131* 132* 117*  MCV 93.9 94.2 93.7 92.9  MCH 31.3 31.0 30.2 31.2  MCHC 33.3 32.9 32.3 33.6  RDW 15.6* 15.9* 15.9* 16.1*  LYMPHSABS 0.7  --   --   --   MONOABS 0.6  --   --   --   EOSABS 0.2  --    --   --   BASOSABS 0.0  --   --   --     Chemistries   Recent Labs Lab 07/10/2013 0800 07/14/2013 1310 07/29/13 0622 07/30/13 0415  NA 143  --  144 138  K 4.1  --  4.1 3.8  CL 100  --  106 102  CO2 28  --  24 23  GLUCOSE 152*  --  114* 134*  BUN 46*  --  36* 43*  CREATININE 2.16* 2.05* 1.92* 2.30*  CALCIUM 10.2  --  9.3 9.1  AST 30  --   --   --   ALT 21  --   --   --   ALKPHOS 65  --   --   --   BILITOT 0.2*  --   --   --    ------------------------------------------------------------------------------------------------------------------ estimated creatinine clearance is 23.7 ml/min (by C-G formula based on Cr of 2.3). ------------------------------------------------------------------------------------------------------------------  Recent Labs  07/27/2013 1310  HGBA1C 6.5*   ------------------------------------------------------------------------------------------------------------------ No results found for this basename: CHOL, HDL, LDLCALC, TRIG, CHOLHDL, LDLDIRECT,  in the last 72 hours ------------------------------------------------------------------------------------------------------------------  Recent Labs  07/21/2013 1310  TSH 2.311   ------------------------------------------------------------------------------------------------------------------  Recent Labs  07/11/2013 1310  VITAMINB12 493    Coagulation profile No results found for this basename: INR, PROTIME,  in the last 168 hours  No results found for this basename: DDIMER,  in the last 72 hours  Cardiac Enzymes  Recent Labs Lab 07/27/2013 0800  TROPONINI <0.30   ------------------------------------------------------------------------------------------------------------------ No components found with this basename: POCBNP,     Neisha Hinger D.O. on 07/30/2013 at 8:07 AM  Between 7am to 7pm - Pager - [phone]  After 7pm go to [email or web address] - password [email or web address]  And look for the night  coverage person covering for me after hours  [facility] Office  [phone]

## 2013-07-30 NOTE — Progress Notes (Signed)
Utilization review completed.  

## 2013-07-30 NOTE — Clinical Documentation Improvement (Signed)
PLEASE CLARIFY CHF TYPE AND ACUITY Possible Clinical Conditions?  Acute Systolic Congestive Heart Failure Acute Diastolic Congestive Heart Failure Acute Systolic & Diastolic Congestive Heart Failure Acute on Chronic Systolic Congestive Heart Failure Acute on Chronic Diastolic Congestive Heart Failure Acute on Chronic Systolic & Diastolic Congestive Heart Failure Chronic Systolic Congestive Heart Failure Chronic Diastolic Congestive Heart Failure Chronic Systolic & Diastolic Congestive Heart Failure Other Condition Cannot Clinically Determine  Supporting Information: "?CHF  -Echocardiogram in April 2014 shows an EF of 55% with normal systolic function. No mention of diastolic dysfunction."  Thank You, Dollene PrimroseJoan B Cera Rorke,RN ,BSN, CCDS, Clinical Documentation Specialist:  [phone]   Cell=[phone] [employer] [employer]

## 2013-07-30 NOTE — Progress Notes (Signed)
Physical Therapy Treatment Patient Details Name: Lucas EavesRufus Obrien MRN: [id number] DOB: May 29, 1925 Today's Date: 07/30/2013 Time: [id number] PT Time Calculation (min): 26 min  PT Assessment / Plan / Recommendation  History of Present Illness Patient is an 80 yo male with a history of hypertension, coronary disease status post CABG, underlying dementia, COPD, that presents to the emergency department with complaints of altered mental status and shortness of breath. Patient with sepsis vs CAP, resp failure with hypoxia, and acute encephalopathy.  To ICU on BiPap.   PT Comments   Patient with decreased mobility today, requiring max assist for all mobility.  Unable to maintain sitting balance at EOB today.  At this time, would recommend SNF at discharge for continued therapy.  Patient currently would require +2 assist for OOB.  Follow Up Recommendations  SNF;Supervision/Assistance - 24 hour     Does the patient have the potential to tolerate intense rehabilitation     Barriers to Discharge        Equipment Recommendations  Rolling walker with 5" wheels;3in1 (PT)    Recommendations for Other Services    Frequency Min 3X/week   Progress towards PT Goals Progress towards PT goals: Not progressing toward goals - comment (Patient requiring increased assist today)  Plan Discharge plan needs to be updated    Precautions / Restrictions Precautions Precautions: Fall Precaution Comments: uses O2 at 2 l/min at home Restrictions Weight Bearing Restrictions: No   Pertinent Vitals/Pain     Mobility  Bed Mobility Overal bed mobility: Needs Assistance Bed Mobility: Supine to Sit;Sit to Supine Supine to sit: Max assist Sit to supine: Max assist General bed mobility comments: Verbal and tactile cues for technique.  Patient requiring assist to move LE's off of bed and raise trunk to sitting.  Poor sitting balance.  Assist to bring LE's onto bed and lower trunk. Transfers Overall transfer level: Needs  assistance Equipment used: Rolling walker (2 wheeled) Transfers: Sit to/from Stand Sit to Stand: Total assist General transfer comment: Attempted to move to standing.  Patient unable to bring trunk forward enough to clear hips from bed.  Continued to have posterior lean in sitting and with standing attempts.      PT Goals (current goals can now be found in the care plan section)    Visit Information  Last PT Received On: 07/30/13 Assistance Needed: +2 History of Present Illness: Patient is an 80 yo male with a history of hypertension, coronary disease status post CABG, underlying dementia, COPD, that presents to the emergency department with complaints of altered mental status and shortness of breath. Patient with sepsis vs CAP, resp failure with hypoxia, and acute encephalopathy.  To ICU on BiPap.    Subjective Data  Subjective: "Harder today"   Cognition  Cognition Arousal/Alertness: Awake/alert Behavior During Therapy: Flat affect Overall Cognitive Status: Impaired/Different from baseline Area of Impairment: Orientation;Attention;Memory;Safety/judgement;Problem solving Orientation Level: Disoriented to;Time;Situation Current Attention Level: Sustained Memory: Decreased short-term memory Safety/Judgement: Decreased awareness of safety Problem Solving: Slow processing;Decreased initiation;Difficulty sequencing;Requires verbal cues General Comments: Patient worked with PT x 15 min.  PT noticed patient with something in mouth.  Patient with sputum in mouth and did not ask to or attempt to spit out.  Encouraged patient to spit into cup grayish yellow sputum.    Balance  Balance Overall balance assessment: Needs assistance Sitting-balance support: Bilateral upper extremity supported;Feet supported Sitting balance-Leahy Scale: Poor Sitting balance - Comments: Patient required max assist to maintain sitting balance today, leaning posteriorly and to  right.  Worked in sitting x15  minutes on balance and midline posture. Postural control: Posterior lean;Right lateral lean  End of Session PT - End of Session Equipment Utilized During Treatment: Gait belt;Oxygen Activity Tolerance: Patient limited by fatigue Patient left: in bed;with call bell/phone within reach;with bed alarm set;with family/visitor present Nurse Communication: Mobility status;Need for lift equipment   GP     Vena Austria 07/30/2013, 12:10 PM Durenda Hurt. Renaldo Fiddler, [facility] Pager [phone]

## 2013-07-30 NOTE — Progress Notes (Signed)
Patient is asleep, resting well at this time. RT will instruct the use of the flutter once patient is awake. Flutter is in room, ready to use. RT will continue to assist as needed.

## 2013-07-31 ENCOUNTER — Inpatient Hospital Stay (HOSPITAL_COMMUNITY): Payer: Medicare Other

## 2013-07-31 DIAGNOSIS — J189 Pneumonia, unspecified organism: Secondary | ICD-10-CM

## 2013-07-31 DIAGNOSIS — J9600 Acute respiratory failure, unspecified whether with hypoxia or hypercapnia: Secondary | ICD-10-CM

## 2013-07-31 LAB — BLOOD GAS, ARTERIAL
ACID-BASE DEFICIT: 5.1 mmol/L — AB (ref 0.0–2.0)
BICARBONATE: 19 meq/L — AB (ref 20.0–24.0)
Drawn by: 331001
FIO2: 0.55 %
O2 SAT: 80.5 %
Patient temperature: 98.6
TCO2: 20 mmol/L (ref 0–100)
pCO2 arterial: 32.7 mmHg — ABNORMAL LOW (ref 35.0–45.0)
pH, Arterial: 7.382 (ref 7.350–7.450)
pO2, Arterial: 45.8 mmHg — ABNORMAL LOW (ref 80.0–100.0)

## 2013-07-31 LAB — EXPECTORATED SPUTUM ASSESSMENT W GRAM STAIN, RFLX TO RESP C

## 2013-07-31 LAB — TROPONIN I
TROPONIN I: 0.88 ng/mL — AB (ref <0.30)
Troponin I: 0.69 ng/mL (ref <0.30)
Troponin I: 0.98 ng/mL (ref <0.30)

## 2013-07-31 LAB — GLUCOSE, CAPILLARY
GLUCOSE-CAPILLARY: 185 mg/dL — AB (ref 70–99)
GLUCOSE-CAPILLARY: 121 mg/dL — AB (ref 70–99)
Glucose-Capillary: 148 mg/dL — ABNORMAL HIGH (ref 70–99)
Glucose-Capillary: 192 mg/dL — ABNORMAL HIGH (ref 70–99)
Glucose-Capillary: 119 mg/dL — ABNORMAL HIGH (ref 70–99)

## 2013-07-31 LAB — MRSA PCR SCREENING: MRSA BY PCR: NEGATIVE

## 2013-07-31 LAB — BASIC METABOLIC PANEL
BUN: 43 mg/dL — ABNORMAL HIGH (ref 6–23)
CHLORIDE: 105 meq/L (ref 96–112)
CO2: 23 mEq/L (ref 19–32)
Calcium: 9.1 mg/dL (ref 8.4–10.5)
Creatinine, Ser: 2.02 mg/dL — ABNORMAL HIGH (ref 0.50–1.35)
GFR, EST AFRICAN AMERICAN: 33 mL/min — AB (ref >90)
GFR, EST NON AFRICAN AMERICAN: 28 mL/min — AB (ref >90)
Glucose, Bld: 145 mg/dL — ABNORMAL HIGH (ref 70–99)
POTASSIUM: 3.9 meq/L (ref 3.7–5.3)
SODIUM: 142 meq/L (ref 137–147)

## 2013-07-31 LAB — EXPECTORATED SPUTUM ASSESSMENT W REFEX TO RESP CULTURE

## 2013-07-31 LAB — PRO B NATRIURETIC PEPTIDE: PRO B NATRI PEPTIDE: 5048 pg/mL — AB (ref 0–450)

## 2013-07-31 LAB — PROCALCITONIN: PROCALCITONIN: 1.64 ng/mL

## 2013-07-31 MED ORDER — ETOMIDATE 2 MG/ML IV SOLN
INTRAVENOUS | Status: AC
  Filled 2013-07-31: qty 20

## 2013-07-31 MED ORDER — SODIUM CHLORIDE 0.9 % IV SOLN: INTRAVENOUS | Status: DC

## 2013-07-31 MED ORDER — LIDOCAINE HCL (CARDIAC) 20 MG/ML IV SOLN
INTRAVENOUS | Status: AC
  Filled 2013-07-31: qty 5

## 2013-07-31 MED ORDER — METOPROLOL TARTRATE 1 MG/ML IV SOLN
2.5000 mg | INTRAVENOUS | Status: DC | PRN
  Administered 2013-07-31 (×2): 2.5 mg via INTRAVENOUS
  Administered 2013-08-02: 5 mg via INTRAVENOUS
  Filled 2013-07-31 (×3): qty 5

## 2013-07-31 MED ORDER — AZTREONAM 1 G IJ SOLR
1.0000 g | Freq: Three times a day (TID) | INTRAMUSCULAR | Status: DC
  Filled 2013-07-31 (×3): qty 1

## 2013-07-31 MED ORDER — SUCCINYLCHOLINE CHLORIDE 20 MG/ML IJ SOLN
INTRAMUSCULAR | Status: AC
  Filled 2013-07-31: qty 1

## 2013-07-31 MED ORDER — DM-GUAIFENESIN ER 30-600 MG PO TB12
1.0000 | ORAL_TABLET | Freq: Two times a day (BID) | ORAL | Status: DC
  Filled 2013-07-31 (×4): qty 1

## 2013-07-31 MED ORDER — INSULIN ASPART 100 UNIT/ML ~~LOC~~ SOLN
0.0000 [IU] | SUBCUTANEOUS | Status: DC
  Administered 2013-07-31: 2 [IU] via SUBCUTANEOUS
  Administered 2013-07-31 – 2013-08-01 (×3): 3 [IU] via SUBCUTANEOUS

## 2013-07-31 MED ORDER — ROCURONIUM BROMIDE 50 MG/5ML IV SOLN
INTRAVENOUS | Status: AC
  Filled 2013-07-31: qty 2

## 2013-07-31 MED ORDER — ALBUTEROL SULFATE (2.5 MG/3ML) 0.083% IN NEBU: 2.5000 mg | INHALATION_SOLUTION | RESPIRATORY_TRACT | Status: DC | PRN

## 2013-07-31 MED ORDER — VANCOMYCIN HCL IN DEXTROSE 1-5 GM/200ML-% IV SOLN
1000.0000 mg | INTRAVENOUS | Status: DC
  Administered 2013-07-31 – 2013-08-01 (×2): 1000 mg via INTRAVENOUS
  Filled 2013-07-31 (×3): qty 200

## 2013-07-31 MED ORDER — DEXTROSE 5 % IV SOLN
1.0000 g | Freq: Three times a day (TID) | INTRAVENOUS | Status: DC
  Administered 2013-07-31 – 2013-08-02 (×6): 1 g via INTRAVENOUS
  Filled 2013-07-31 (×9): qty 1

## 2013-07-31 MED ORDER — IPRATROPIUM-ALBUTEROL 0.5-2.5 (3) MG/3ML IN SOLN
3.0000 mL | Freq: Four times a day (QID) | RESPIRATORY_TRACT | Status: DC
  Administered 2013-07-31 – 2013-08-02 (×9): 3 mL via RESPIRATORY_TRACT
  Filled 2013-07-31 (×9): qty 3

## 2013-07-31 MED ORDER — FUROSEMIDE 10 MG/ML IJ SOLN: 40.0000 mg | Freq: Once | INTRAMUSCULAR | Status: DC

## 2013-07-31 NOTE — Progress Notes (Signed)
Pt placed on BiPAP. No ABG at this time per MD.

## 2013-07-31 NOTE — Consult Note (Signed)
Name: Lucas Obrien MRN: [id number] DOB: [birth year]    ADMISSION DATE:  08/13/2013 CONSULTATION DATE:  07/31/13  REFERRING MD :  Dr. Catha Gosselin PRIMARY SERVICE: [facility]-->PCCM  CHIEF COMPLAINT:  Hypoxia   BRIEF PATIENT DESCRIPTION: 80 y/o M with multiple medical problems, baseline 2L O2 dependent, admitted 2/28 with influenza A.  Developed progressive hypoxia & R>L airspace disease on CXR.  Tx to ICU 3/3 for bipap support  SIGNIFICANT EVENTS / STUDIES:  2/28 - Admit with influenza A  LINES / TUBES:   CULTURES: BCx2 2/28>>> BCx1 2/28>>> UC 2/28>>>neg Sputum 3/3>>>  ANTIBIOTICS: Levaquin 2/28 (empiric PNA)>>>  HISTORY OF PRESENT ILLNESS:  80 y/o AAM, former smoker, with PMH of CAD s/p CABG, HTN, HLD, DM, CKD, Dementia, Gout, DM, former ETOH abuse, 2L O2 dependent COPD who presented to [facility] ER on 2/28 with AMS, productive cough & shortness of breath.  ER evaluation demonstrated fever of 101, lactate of 2.3, acute on chronic kidney injury and tachypnea.  Initial CXR was negative for acute process. Patient was admitted by [facility] and treated for AECOPD.    3/3 PCCM consulted for progressive hypoxemia despite increase in supplemental oxygen.  PCXR assessed and demonstrated increase in R>L bilateral airspace disease.  ABG 7.38 / 32 / 45 / 19.  Patient with marginal mental status, hypoxemia and increased WOB.  Tx to ICU for bipap support.   PAST MEDICAL HISTORY :  Past Medical History  Diagnosis Date  . Hypertension   . Hyperlipidemia   . Urine incontinence   . Alcohol problem drinking   . Emphysema of lung   . Type II diabetes mellitus   . Chronic kidney disease     "moderate"  . Arthritis     "all over"  . Gout   . Dementia   . UTI (lower urinary tract infection) 12/08/2011    "first one"  . Shortness of breath   . Pneumonia 08/21/2012  . Coronary artery disease   . Dysrhythmia   . Oxygen dependent 2L  . Glaucoma   . Dementia    Past Surgical History  Procedure Laterality Date   . Coronary artery bypass graft  1994   Prior to Admission medications   Medication Sig Start Date End Date Taking? Authorizing Provider  albuterol (PROAIR HFA) 108 (90 BASE) MCG/ACT inhaler Inhale 2 puffs into the lungs every 4 (four) hours as needed for wheezing. 01/16/13  Yes Sheliah Hatch, MD  albuterol (PROVENTIL) (2.5 MG/3ML) 0.083% nebulizer solution Take 3 mLs (2.5 mg total) by nebulization every 4 (four) hours. 05/12/13 08/13/2013 Yes Gerhard Munch, MD  allopurinol (ZYLOPRIM) 300 MG tablet Take 300 mg by mouth daily.   Yes Historical Provider, MD  amLODipine (NORVASC) 5 MG tablet take 1 tablet by mouth every morning 03/01/13  Yes Sheliah Hatch, MD  aspirin 325 MG tablet Take 325 mg by mouth every morning.    Yes Historical Provider, MD  budesonide-formoterol (SYMBICORT) 160-4.5 MCG/ACT inhaler Inhale 2 puffs into the lungs 2 (two) times daily.   Yes Historical Provider, MD  cetirizine (ZYRTEC) 10 MG tablet Take 10 mg by mouth daily.   Yes Historical Provider, MD  donepezil (ARICEPT) 5 MG tablet take 1 tablet by mouth at bedtime 10/14/12  Yes Sheliah Hatch, MD  furosemide (LASIX) 40 MG tablet take 1 tablet by mouth daily **WEIGH DAILY- IF MORE THAN 2 POUND WEIGHT GAIN, TAKE 2 TABLETS THAT MORNING** 03/24/13  Yes Sheliah Hatch, MD  glipiZIDE (GLUCOTROL  XL) 2.5 MG 24 hr tablet Take 1 tablet (2.5 mg total) by mouth daily with breakfast. 04/18/13  Yes Sheliah Hatch, MD  ipratropium (ATROVENT) 0.06 % nasal spray instill 1 spray into each nostril three times a day 12/30/12  Yes Sheliah Hatch, MD  meclizine (ANTIVERT) 25 MG tablet Take 25 mg by mouth 3 (three) times daily as needed for dizziness.   Yes Historical Provider, MD  memantine (NAMENDA) 10 MG tablet Take 10 mg by mouth 2 (two) times daily.   Yes Historical Provider, MD  metoprolol (LOPRESSOR) 100 MG tablet Take 50 mg by mouth 2 (two) times daily.   Yes Historical Provider, MD  simvastatin (ZOCOR) 10 MG tablet  Take 10 mg by mouth daily.   Yes Historical Provider, MD  tamsulosin (FLOMAX) 0.4 MG CAPS capsule Take 0.4 mg by mouth daily.   Yes Historical Provider, MD  thiamine (VITAMIN B-1) 100 MG tablet Take 100 mg by mouth daily.   Yes Historical Provider, MD  tiotropium (SPIRIVA) 18 MCG inhalation capsule Place 18 mcg into inhaler and inhale daily.   Yes Historical Provider, MD  valsartan-hydrochlorothiazide (DIOVAN-HCT) 320-25 MG per tablet take 1 tablet by mouth once daily 03/10/13  Yes Sheliah Hatch, MD  glucose blood test strip Test twice daily as directed.  Dx 250.42 10/03/12   Sheliah Hatch, MD  [facility] DELICA LANCETS 33G MISC use twice a day as directed    Sheliah Hatch, MD   Allergies  Allergen Reactions  . Bee Venom     Unknown  . Penicillins     Unknown reaction    FAMILY HISTORY:  Family History  Problem Relation Age of Onset  . Arthritis Mother   . Cancer Father   . Hyperlipidemia Father   . Heart disease Father   . Hypertension Father   . Diabetes Father   . Stroke Sister   . Stroke Brother    SOCIAL HISTORY:  reports that he quit smoking about 5 years ago. His smoking use included Cigarettes. He has a 33.5 pack-year smoking history. He has never used smokeless tobacco. He reports that he drinks alcohol. He reports that he does not use illicit drugs.  REVIEW OF SYSTEMS:  Unable to complete, medical information obtained from family at bedside and previous medical documentation.   SUBJECTIVE:   VITAL SIGNS: Temp:  [98.6 F (37 C)-102.9 F (39.4 C)] 101.4 F (38.6 C) (03/03 0833) Pulse Rate:  [68-95] 90 (03/03 0833) Resp:  [16-40] 18 (03/03 0833) BP: (127-166)/(53-105) 166/65 mmHg (03/03 0833) SpO2:  [73 %-100 %] 93 % (03/03 0833) FiO2 (%):  [55 %] 55 % (03/03 0805) HEMODYNAMICS:   VENTILATOR SETTINGS: Vent Mode:  [-]  FiO2 (%):  [55 %] 55 % INTAKE / OUTPUT: Intake/Output     03/02 0701 - 03/03 0700 03/03 0701 - 03/04 0700   P.O. 240 120   I.V.  (mL/kg) 1005 (11.2)    IV Piggyback     Total Intake(mL/kg) 1245 (13.9) 120 (1.3)   Urine (mL/kg/hr) 400 (0.2)    Total Output 400     Net +845 +120        Urine Occurrence 401 x    Stool Occurrence       PHYSICAL EXAMINATION: General:  Frail elderly male, VM in place Neuro:  Arouses, awake, oriented to hospital but otherwise unable to answer questions HEENT:  Mm pink/moist, no jvd Cardiovascular:  s1s2 rrr, no m/r/g Lungs:  resp's shallow, tachypnea  with abd accessory use, lungs bilaterally coarse Abdomen:  Round/soft, bsx4 active Musculoskeletal:  No acute deformities  Skin:  Warm/dry, no edema  LABS:  CBC  Recent Labs Lab 07/20/2013 1310 07/29/13 0622 07/30/13 0415  WBC 4.8 6.5 7.7  HGB 9.7* 10.1* 9.7*  HCT 29.5* 31.3* 28.9*  PLT 131* 132* 117*   Coag's No results found for this basename: APTT, INR,  in the last 168 hours BMET  Recent Labs Lab 07/29/13 0622 07/30/13 0415 07/31/13 0457  NA 144 138 142  K 4.1 3.8 3.9  CL 106 102 105  CO2 24 23 23   BUN 36* 43* 43*  CREATININE 1.92* 2.30* 2.02*  GLUCOSE 114* 134* 145*   Electrolytes  Recent Labs Lab 07/29/13 0622 07/30/13 0415 07/31/13 0457  CALCIUM 9.3 9.1 9.1   Sepsis Markers  Recent Labs Lab 07/08/2013 0825 07/29/13 0622  LATICACIDVEN 2.31* 0.9   ABG  Recent Labs Lab 07/31/13 1045  PHART 7.382  PCO2ART 32.7*  PO2ART 45.8*   Liver Enzymes  Recent Labs Lab 07/16/2013 0800  AST 30  ALT 21  ALKPHOS 65  BILITOT 0.2*  ALBUMIN 3.8   Cardiac Enzymes  Recent Labs Lab 07/09/2013 0800 07/31/13 0838  TROPONINI <0.30  --   PROBNP  --  5048.0*   Glucose  Recent Labs Lab 07/29/13 2043 07/30/13 0732 07/30/13 1141 07/30/13 1605 07/30/13 2046 07/31/13 0741  GLUCAP 121* 149* 154* 132* 138* 148*    Imaging Dg Chest Port 1 View  07/31/2013   CLINICAL DATA:  Congestion and dyspnea  EXAM: PORTABLE CHEST - 1 VIEW  COMPARISON:  DG CHEST 2 VIEW dated 07/15/2013  FINDINGS: There has been  marked progression in the consolidation of the lung parenchyma bilaterally. Confluent interstitial and alveolar infiltrates are present in the right lung. On the left in the mid and lower hemithorax increasing interstitial and alveolar densities are present. The cardiac silhouette is mildly enlarged. The pulmonary vascularity is obscured. There are 7 intact sternal wires present.  IMPRESSION: The findings are consistent with bilateral alveolar filling process ease likely pneumonia, greater on the right than on the left. One cannot exclude an element of pulmonary edema either. There has been marked deterioration since the previous study.   Electronically Signed   By: David  [place]azil[place]   On: 07/31/2013 10:43     ASSESSMENT / PLAN:  PULMONARY A: Acute Hypoxic Respiratory Failure - concern for ALI in setting of influenza vs secondary bacterial infection Influenza A Positive Bilateral Airspace Disease R>L Baseline Oxygen Dependence (2L) COPD / Former Tobacco Abuse  P:   -now ABG & CXR -transfer to ICU for bipap support -ongoing discussion with family regarding intubation -see ID   CARDIOVASCULAR A:  CAD s/p CABG HTN HLD R/O decompensated CHF  P:  -assess ECHO, EKG -trend BNP -assess enzymes, r/o cardiac event -continue norvasc, ASA, lopressor  RENAL A:   CKD - baseline appears to be 1.4-1.7 P:   -hold nephrotoxic agents as able  GASTROINTESTINAL A:   At Risk Aspiration  P:   -NPO until pulmonary status sorted out -if intubated, will need PPI  HEMATOLOGIC A:   Thrombocytopenia Anemia  P:  -monitor platelets / CBC -thiamine   INFECTIOUS A:   Influenza A Positive R/O HCAP - initial film clear on admit, 3/3 with significant worsening on R  P:   -continue tamiflu  -empiric abx as above -broaden abx for HCAP   ENDOCRINE A:   Hyperglycemia P:   -SSI  NEUROLOGIC A:   Dementia  P:   -continue namenda, aricept -minimize sedation as able, promote sleep / wake  cycle   [employer] -Son [employer] manager) is self designated HCOPA but does not have legal paperwork (self reported).  Apparently, there are 8 children and a lot of psychosocial dynamics in regards to how decisions are made for the patient.  Discussed possibility of intubation with son and encouraged him to talk with siblings regarding mechanical ventilation decision.     Canary Brim, NP-C  Pulmonary & Critical Care Pgr: [phone] or [phone]  07/31/2013, 11:46 AM   Reviewed above, examined pt, and agree assessment/plan.  80 yo male admitted with dyspnea.  Found to have influenza A positive.  Developed fever, acute respiratory distress and hypoxia.  CXR shows new pulmonary infiltrates likely from bacterial superinfection.  Has some improvement with BiPAP >> still high risk need for intubation.  Difficulty family situation (several children, and uncertain about who is spokesperson for family).  Will ask palliative care to assist with goals of care.  CC time 50 minutes.  Coralyn Helling, MD [facility] Pulmonary/Critical Care 07/31/2013, 12:21 PM Pager:  [phone] After 3pm call: [phone]

## 2013-07-31 NOTE — Progress Notes (Signed)
Follow-up:  Notified by RN that pt noted w/ sats in the 50's on 2L [place]. He is noted to be more tachypneic that earlier. He was placed on NRB at 100% and now has 02 sats in the high 90's. I saw pt at bedside. He is noted awake and alert and in no overt respiratory distress though is somewhat tachypneic at 36-40 respirations minute. Skin w/d, color normal. Pt denies c/o and states he is "fine". BBS w/ very coarse rhonchi bil. Prior to my arrival RT suctioned pt after he had some productive coughing. Unable to keep 02 sats above 87 on [place] @ 5/L. Pt placed on venti-mask at 50% and now noted w/ 02 sats of 94-96%. RR now 30. RN admits he currently looks much better.  Assessment/Plan: 1. Hypoxia in setting of pt w/ COPD, CAP and positive Influenza A: Resolved w/ use of VM at 50%, RR has decreased as well (30). RT to work w/ pt w/ flutter valve to encourage pt to cough and bring up secretions. He currently in no acute respiratory distress. Will continue VM and flutter valve therapy. Low threshold to transfer to SDU if unable to keep 02 sats WNL and or acute respiratory distress. Will place on continuous pulse oximetry and continue to monitor closely.   Leanne ChangKatherine P. Samanyu Tinnell, NP-C [facility] Pager [phone]

## 2013-07-31 NOTE — Progress Notes (Signed)
ANTIBIOTIC CONSULT NOTE - INITIAL  Pharmacy Consult for Vancomycin and Aztreonam Indication: rule out pneumonia  Allergies  Allergen Reactions  . Bee Venom     Unknown  . Penicillins     Unknown reaction    Patient Measurements: Height: 5\' 9"  (175.3 cm) Weight: 187 lb 6.3 oz (85 kg) IBW/kg (Calculated) : 70.7  Vital Signs: Temp: 101 F (38.3 C) (03/03 1205) Temp src: Axillary (03/03 1205) BP: 170/77 mmHg (03/03 1300) Pulse Rate: 86 (03/03 1300) Intake/Output from previous day: 03/02 0701 - 03/03 0700 In: 1245 [P.O.:240; I.V.:1005] Out: 400 [Urine:400] Intake/Output from this shift: Total I/O In: 120 [P.O.:120] Out: -   Labs:  Recent Labs  07/29/13 0622 07/30/13 0415 07/31/13 0457  WBC 6.5 7.7  --   HGB 10.1* 9.7*  --   PLT 132* 117*  --   CREATININE 1.92* 2.30* 2.02*   Estimated Creatinine Clearance: 28.4 ml/min (by C-G formula based on Cr of 2.02). No results found for this basename: VANCOTROUGH, VANCOPEAK, VANCORANDOM, GENTTROUGH, GENTPEAK, GENTRANDOM, TOBRATROUGH, TOBRAPEAK, TOBRARND, AMIKACINPEAK, AMIKACINTROU, AMIKACIN,  in the last 72 hours   Microbiology: Recent Results (from the past 720 hour(s))  CULTURE, BLOOD (ROUTINE X 2)     Status: None   Collection Time    03/19/14  8:00 AM      Result Value Ref Range Status   Specimen Description BLOOD LEFT ARM   Final   Special Requests BOTTLES DRAWN AEROBIC AND ANAEROBIC 10CC   Final   Culture  Setup Time     Final   Value: 01/31/14 16:44     Performed at [facility]   Culture     Final   Value:        BLOOD CULTURE RECEIVED NO GROWTH TO DATE CULTURE WILL BE HELD FOR 5 DAYS BEFORE ISSUING A FINAL NEGATIVE REPORT     Performed at [facility]   Report Status PENDING   Incomplete  CULTURE, BLOOD (ROUTINE X 2)     Status: None   Collection Time    03/19/14  1:10 PM      Result Value Ref Range Status   Specimen Description BLOOD RIGHT ARM   Final   Special Requests BOTTLES DRAWN  AEROBIC ONLY 10CC   Final   Culture  Setup Time     Final   Value: 01/31/14 23:32     Performed at [facility]   Culture     Final   Value:        BLOOD CULTURE RECEIVED NO GROWTH TO DATE CULTURE WILL BE HELD FOR 5 DAYS BEFORE ISSUING A FINAL NEGATIVE REPORT     Performed at [facility]   Report Status PENDING   Incomplete  CULTURE, BLOOD (ROUTINE X 2)     Status: None   Collection Time    03/19/14  1:10 PM      Result Value Ref Range Status   Specimen Description BLOOD RIGHT ARM   Final   Special Requests BOTTLES DRAWN AEROBIC ONLY 10CC   Final   Culture  Setup Time     Final   Value: 01/31/14 23:32     Performed at [facility]   Culture     Final   Value:        BLOOD CULTURE RECEIVED NO GROWTH TO DATE CULTURE WILL BE HELD FOR 5 DAYS BEFORE ISSUING A FINAL NEGATIVE REPORT     Performed at [facility]  Report Status PENDING   Incomplete  URINE CULTURE     Status: None   Collection Time    August 06, 2013  4:14 PM      Result Value Ref Range Status   Specimen Description URINE, RANDOM   Final   Special Requests NONE   Final   Culture  Setup Time     Final   Value: 08/06/13 21:42     Performed at [facility] Count     Final   Value: NO GROWTH     Performed at [facility]   Culture     Final   Value: NO GROWTH     Performed at [facility]   Report Status 07/29/2013 FINAL   Final  CULTURE, EXPECTORATED SPUTUM-ASSESSMENT     Status: None   Collection Time    07/31/13  7:41 AM      Result Value Ref Range Status   Specimen Description SPUTUM   Final   Special Requests NONE   Final   Sputum evaluation     Final   Value: MICROSCOPIC FINDINGS SUGGEST THAT THIS SPECIMEN IS NOT REPRESENTATIVE OF LOWER RESPIRATORY SECRETIONS. PLEASE RECOLLECT.   Report Status 07/31/2013 FINAL   Final  MRSA PCR SCREENING     Status: None   Collection Time    07/31/13 11:59 AM      Result Value Ref Range Status   MRSA by PCR  NEGATIVE  NEGATIVE Final   Comment:            The GeneXpert MRSA Assay (FDA     approved for NASAL specimens     only), is one component of a     comprehensive MRSA colonization     surveillance program. It is not     intended to diagnose MRSA     infection nor to guide or     monitor treatment for     MRSA infections.    Medical History: Past Medical History  Diagnosis Date  . Hypertension   . Hyperlipidemia   . Urine incontinence   . Alcohol problem drinking   . Emphysema of lung   . Type II diabetes mellitus   . Chronic kidney disease     "moderate"  . Arthritis     "all over"  . Gout   . Dementia   . UTI (lower urinary tract infection) 12/08/2011    "first one"  . Shortness of breath   . Pneumonia 08/21/2012  . Coronary artery disease   . Dysrhythmia   . Oxygen dependent 2L  . Glaucoma   . Dementia     Medications:  Prescriptions prior to admission  Medication Sig Dispense Refill  . albuterol (PROAIR HFA) 108 (90 BASE) MCG/ACT inhaler Inhale 2 puffs into the lungs every 4 (four) hours as needed for wheezing.  1 Inhaler  6  . albuterol (PROVENTIL) (2.5 MG/3ML) 0.083% nebulizer solution Take 3 mLs (2.5 mg total) by nebulization every 4 (four) hours.  75 mL  12  . allopurinol (ZYLOPRIM) 300 MG tablet Take 300 mg by mouth daily.      Marland Kitchen amLODipine (NORVASC) 5 MG tablet take 1 tablet by mouth every morning  30 tablet  6  . aspirin 325 MG tablet Take 325 mg by mouth every morning.       . budesonide-formoterol (SYMBICORT) 160-4.5 MCG/ACT inhaler Inhale 2 puffs into the lungs 2 (two) times daily.      Marland Kitchen  cetirizine (ZYRTEC) 10 MG tablet Take 10 mg by mouth daily.      Marland Kitchen donepezil (ARICEPT) 5 MG tablet take 1 tablet by mouth at bedtime  30 tablet  11  . furosemide (LASIX) 40 MG tablet take 1 tablet by mouth daily **WEIGH DAILY- IF MORE THAN 2 POUND WEIGHT GAIN, TAKE 2 TABLETS THAT MORNING**  30 tablet  5  . glipiZIDE (GLUCOTROL XL) 2.5 MG 24 hr tablet Take 1 tablet (2.5 mg  total) by mouth daily with breakfast.  30 tablet  6  . ipratropium (ATROVENT) 0.06 % nasal spray instill 1 spray into each nostril three times a day  15 mL  6  . meclizine (ANTIVERT) 25 MG tablet Take 25 mg by mouth 3 (three) times daily as needed for dizziness.      . memantine (NAMENDA) 10 MG tablet Take 10 mg by mouth 2 (two) times daily.      . metoprolol (LOPRESSOR) 100 MG tablet Take 50 mg by mouth 2 (two) times daily.      . simvastatin (ZOCOR) 10 MG tablet Take 10 mg by mouth daily.      . tamsulosin (FLOMAX) 0.4 MG CAPS capsule Take 0.4 mg by mouth daily.      Marland Kitchen thiamine (VITAMIN B-1) 100 MG tablet Take 100 mg by mouth daily.      Marland Kitchen tiotropium (SPIRIVA) 18 MCG inhalation capsule Place 18 mcg into inhaler and inhale daily.      . valsartan-hydrochlorothiazide (DIOVAN-HCT) 320-25 MG per tablet take 1 tablet by mouth once daily  30 tablet  5  . glucose blood test strip Test twice daily as directed.  Dx 250.42  100 each  12  . ONETOUCH DELICA LANCETS 33G MISC use twice a day as directed  100 each  2   Assessment: 80 yo M admitted 07/27/2013 transferred to [facility] on 3/3 with respiratory distress.  Pharmacy consulted to broaden antibiotic coverage to cover HCAP  ID:  R/o HCAP, WBC wnl, Tm 102.9 3/3 vanc >> 3/3 aztreonam >>  Goal of Therapy:  Vancomycin trough level 15-20 mcg/ml  Plan:  1. Aztreonam 1g IV q8h 2. Vancomycin 1g IV q24h 3. Follow up SCr, UOP, cultures, clinical course and adjust as clinically indicated.   Thank you for allowing pharmacy to be a part of this patients care team.  Lovenia Kim Pharm.D., BCPS Clinical Pharmacist 07/31/2013 3:24 PM Pager: [phone] Phone: [phone]

## 2013-07-31 NOTE — Progress Notes (Signed)
Pt transferred to ICU 2H 01. Pt placed on bipap directly. Pt reassessed and noted to be confused to place, time and situation. Pt does know his family. Pt trying to get out of bed. Son at bedside. Reeducated on importance of not getting out of bed. Bed alarm on.

## 2013-07-31 NOTE — Progress Notes (Signed)
eLink Physician-Brief Progress Note Patient Name: Lucas EavesRufus Obrien DOB: Nov 25, 1924 MRN: [id number]  Date of Service  07/31/2013   HPI/Events of Note   Troponins from .68 to .98.  EKG negative.  Patient is asymptomatic.  eICU Interventions  ASA, no heparin and will consider intubation if deteriorates.   Intervention Category Major Interventions: Other:  YACOUB,WESAM 07/31/2013, 6:30 PM

## 2013-07-31 NOTE — Progress Notes (Signed)
Thank you for consulting the Palliative Medicine Team at [facility] to meet your patient's and family's needs.   The reason that you asked us to see your patient is  For  Clarification of GOC and options  We have scheduled your patient for a meeting: Tomorrow at 1200 in patient room  The Surrogate decision make is: Carlyle LipaRicky Villard is the son that the patient has been living with for the past 3 years  Other family members that need to be present: Shirlyn GoltzRegina Aimee # [phone] (by telephone)  -all other family members have been notified and will try to attend meeting  -left message for Dr Beverely Lowabori to attain collaborative information  Your patient is able/unable to participate: unable   Lorinda CreedMary Jashad Depaula NP  Palliative Medicine Team Team Phone # [phone] Pager [phone]

## 2013-07-31 NOTE — Progress Notes (Addendum)
[facility]                                                                              Patient Demographics  Lucas EavesRufus Pandey, is a 80 y.o. male, DOB - 08-31-1924, ZOX:[id number]  Admit date - 07/06/2013   Admitting Physician Edsel PetrinMaryann Shawnise Peterkin, DO  Outpatient Primary MD for the patient is Neena RhymesKatherine Tabori, MD  LOS - 3   Chief Complaint  Patient presents with  . Fever      Interim history:  80yo male with hypertension, coronary disease status post CABG, underlying dementia, COPD, that presents to the emergency department with complaints of altered mental status and shortness of breath. Has baseline dementia, however was increasing confused.  Admitted for CAP, found to also be positive for Influenza A.  On Levaquin and tamiflu. Continues to have fevers.  Other workup negative at this time.   Assessment & Plan   Sepsis to possible community-acquired pneumonia and Influenza A -Patient continues to be febrile, receiving tyelnol -Lactic acid 0.9 -CXR: Bibasilar coarse opacities, left greater than right -Influenza A positive -Pending sputum culture and gram stain -UA negative, urine strep pneumonia and legionella antigens negative, blood cultures negative -Continue levaquin and tamiflu along with supplemental oxygen, duonebs -Will obtain repeat CXR -Will consult pulmonology for further recommendations  Acute respiratory failure with hypoxia  -Multifactorial: community-acquired pneumonia versus COPD exacerbation versus influenza -treatment and plan as stated above. -Patient had overnight hypoxia, required ventimask.  Will attempt to wean today.   Acute encephalopathy with underlying dementia  -Likely secondary to infectious etiology.  -Continue Namenda, aricept, as well as thiamine.  -TSH 2.311, Vit B12 493, folate pending  Acute on chronic kidney failure, stage II  -Cr 2.02, baseline creatinine is 1.7.  -Possibly secondary to medications versus dehydration.    Hypertension  -Holding Lasix and Diovan HCT due to acute kidney injury.  -Continue amlodipine and metoprolol.   COPD  -Continue Spiriva and symbicort.   Gout  -Stable, Will hold allopurinol due to AKI.   History of coronary artery disease  -Currently chest pain free.  -Continue metoprolol, simvastatin, amlodipine, aspirin. -Valsartan held due to AKI   ?CHF  -Echocardiogram in April 2014 shows an EF of 55% with normal systolic function. No mention of diastolic dysfunction.  -BNP mildly elevated at 400.  -Continue daily weights, strict input and output, and fluid restriction  -Will obtain repeat BNP and echocardiogram.  Patient was receiving gentle fluid rehydration for AKI and sepsis.  Will discontinue fluids.  -If BNP is elevated, will restart lasix, however IV dose.    Diabetes mellitus, type II  -Glipizide held.  -Continue insulin sliding scale as well as CBG monitoring.   Vertigo  -Continue meclizine  Code Status: Full  Family Communication: none at bedside  Disposition Plan: Admitted  Time Spent in minutes   20 minutes  Procedures None  Consults  None  DVT Prophylaxis  Lovenox   Lab Results  Component Value Date   PLT 117* 07/30/2013    Medications  Scheduled Meds: . amLODipine  5 mg Oral Daily  . aspirin  325 mg Oral q morning - 10a  . budesonide-formoterol  2 puff  Inhalation BID  . donepezil  5 mg Oral QHS  . enoxaparin (LOVENOX) injection  30 mg Subcutaneous Q24H  . insulin aspart  0-15 Units Subcutaneous TID WC  . insulin aspart  0-5 Units Subcutaneous QHS  . ipratropium-albuterol  3 mL Nebulization TID  . levofloxacin (LEVAQUIN) IV  750 mg Intravenous Q48H  . loratadine  10 mg Oral Daily  . memantine  10 mg Oral BID  . metoprolol  50 mg Oral BID  . oseltamivir  30 mg Oral Daily  . simvastatin  10 mg Oral Daily  . tamsulosin  0.4 mg Oral Daily  . thiamine  100 mg Oral Daily  . tiotropium  18 mcg Inhalation Daily   Continuous Infusions:    PRN Meds:.acetaminophen, acetaminophen, guaifenesin, meclizine  Antibiotics    Anti-infectives   Start     Dose/Rate Route Frequency Ordered Stop   07/31/13 1130  levofloxacin (LEVAQUIN) IVPB 750 mg     750 mg 100 mL/hr over 90 Minutes Intravenous Every 48 hours 07/29/13 1530 Aug 08, 2013 1129   07/27/2013 1600  oseltamivir (TAMIFLU) capsule 30 mg     30 mg Oral Daily 07/27/2013 1556 08/09/2013 0959   07/18/2013 1130  levofloxacin (LEVAQUIN) IVPB 750 mg  Status:  Discontinued     750 mg 100 mL/hr over 90 Minutes Intravenous Every 24 hours 07/27/2013 1124 07/29/13 1530   07/07/2013 0745  cefTRIAXone (ROCEPHIN) 1 g in dextrose 5 % 50 mL IVPB     1 g 100 mL/hr over 30 Minutes Intravenous  Once 07/18/2013 0730 07/04/2013 0822   07/20/2013 0745  azithromycin (ZITHROMAX) 500 mg in dextrose 5 % 250 mL IVPB     500 mg 250 mL/hr over 60 Minutes Intravenous  Once 07/14/2013 0730 07/16/2013 1016        Subjective:   Lucas Obrien seen and examined today.  Patient has no complaints.  Patient had episode of hypoxia overnight requiring ventimask.  He currently denies any pain or shortness of breath.  He continues to have cough with sputum production.  Objective:   Filed Vitals:   07/30/13 2017 07/30/13 2049 07/30/13 2120 07/31/13 0552  BP:  131/105  135/68  Pulse:  95  68  Temp:  102.9 F (39.4 C)  100.6 F (38.1 C)  TempSrc:  Oral  Oral  Resp:  16 40 18  Height:      Weight:      SpO2: 92% 73% 97% 100%    Wt Readings from Last 3 Encounters:  07/29/13 89.4 kg (197 lb 1.5 oz)  05/16/13 84.539 kg (186 lb 6 oz)  04/18/13 86.694 kg (191 lb 2 oz)     Intake/Output Summary (Last 24 hours) at 07/31/13 0740 Last data filed at 07/31/13 0554  Gross per 24 hour  Intake   1245 ml  Output    400 ml  Net    845 ml    Exam General: Well developed, well nourished, NAD, appears stated age  HEENT: NCAT, mucous membranes moist.  Neck: Supple, no JVD, no masses  Cardiovascular: S1 S2 auscultated, no rubs,  murmurs or gallops. Regular rate and rhythm.  Respiratory: Breath sounds improving, still diminished with some expiratory wheezing Abdomen: Soft, nontender, nondistended, + bowel sounds  Extremities: warm dry without cyanosis clubbing or edema  Neuro: Awake and alert, not oriented to place. No focal deficits Skin: Without rashes exudates or nodules  Psych: Normal affect and demeanor, pleasant  Data Review   Micro Results Recent Results (  from the past 240 hour(s))  CULTURE, BLOOD (ROUTINE X 2)     Status: None   Collection Time    08/24/2013  8:00 AM      Result Value Ref Range Status   Specimen Description BLOOD LEFT ARM   Final   Special Requests BOTTLES DRAWN AEROBIC AND ANAEROBIC 10CC   Final   Culture  Setup Time     Final   Value: Aug 24, 2013 16:44     Performed at [facility]   Culture     Final   Value:        BLOOD CULTURE RECEIVED NO GROWTH TO DATE CULTURE WILL BE HELD FOR 5 DAYS BEFORE ISSUING A FINAL NEGATIVE REPORT     Performed at [facility]   Report Status PENDING   Incomplete  CULTURE, BLOOD (ROUTINE X 2)     Status: None   Collection Time    08-24-13  1:10 PM      Result Value Ref Range Status   Specimen Description BLOOD RIGHT ARM   Final   Special Requests BOTTLES DRAWN AEROBIC ONLY 10CC   Final   Culture  Setup Time     Final   Value: 08/24/2013 23:32     Performed at [facility]   Culture     Final   Value:        BLOOD CULTURE RECEIVED NO GROWTH TO DATE CULTURE WILL BE HELD FOR 5 DAYS BEFORE ISSUING A FINAL NEGATIVE REPORT     Performed at [facility]   Report Status PENDING   Incomplete  CULTURE, BLOOD (ROUTINE X 2)     Status: None   Collection Time    Aug 24, 2013  1:10 PM      Result Value Ref Range Status   Specimen Description BLOOD RIGHT ARM   Final   Special Requests BOTTLES DRAWN AEROBIC ONLY 10CC   Final   Culture  Setup Time     Final   Value: 2013/08/24 23:32     Performed at [facility]   Culture      Final   Value:        BLOOD CULTURE RECEIVED NO GROWTH TO DATE CULTURE WILL BE HELD FOR 5 DAYS BEFORE ISSUING A FINAL NEGATIVE REPORT     Performed at [facility]   Report Status PENDING   Incomplete  URINE CULTURE     Status: None   Collection Time    August 24, 2013  4:14 PM      Result Value Ref Range Status   Specimen Description URINE, RANDOM   Final   Special Requests NONE   Final   Culture  Setup Time     Final   Value: 08-24-2013 21:42     Performed at [facility]     Final   Value: NO GROWTH     Performed at [facility]   Culture     Final   Value: NO GROWTH     Performed at [facility]   Report Status 07/29/2013 FINAL   Final    Radiology Reports Dg Chest 2 View  Aug 24, 2013   CLINICAL DATA:  Fever  EXAM: CHEST - 2 VIEW  COMPARISON:  Earlier film of the same day  FINDINGS: Previous median sternotomy. Mild cardiomegaly. Tortuous atheromatous aorta. Coarse interstitial and airspace opacities in both lung bases left greater than right, probably largely chronic. Similar opacities seen on  films dating back to 02/13/2012. Marland Kitchen No effusion.  IMPRESSION: Bibasilar coarse opacities, left greater than right, probably largely chronic. No definite acute or superimposed abnormality.   Electronically Signed   By: Oley Balm M.D.   On: 12-Aug-2013 09:29   Dg Chest Port 1 View  2013-08-12   CLINICAL DATA:  Altered mental status, evaluate for pneumonia  EXAM: PORTABLE CHEST - 1 VIEW  COMPARISON:  DG CHEST 2 VIEW dated 05/12/2013; DG CHEST 2 VIEW dated 08/21/2012; DG CHEST 2 VIEW dated 02/13/2012; DG CHEST 2 VIEW dated 02/14/2011  FINDINGS: Grossly unchanged enlarged cardiac silhouette and mediastinal contours post median sternotomy. Pulmonary venous congestion without frank evidence of edema. Grossly unchanged bibasilar heterogeneous opacities, left greater than right, likely atelectasis. Trace bilateral effusions are not excluded. No pneumothorax.  Grossly unchanged bones.  IMPRESSION: 1. No definite acute cardiopulmonary disease on this AP portable examination. Further evaluation with a PA and lateral chest radiograph may be obtained as clinically indicated. 2. Similar findings of cardiomegaly, pulmonary venous congestion and bibasilar opacities, likely atelectasis.   Electronically Signed   By: Simonne Come M.D.   On: 2013/08/12 08:05    CBC  Recent Labs Lab Aug 12, 2013 0800 Aug 12, 2013 1310 07/29/13 0622 07/30/13 0415  WBC 7.6 4.8 6.5 7.7  HGB 11.8* 9.7* 10.1* 9.7*  HCT 35.4* 29.5* 31.3* 28.9*  PLT 155 131* 132* 117*  MCV 93.9 94.2 93.7 92.9  MCH 31.3 31.0 30.2 31.2  MCHC 33.3 32.9 32.3 33.6  RDW 15.6* 15.9* 15.9* 16.1*  LYMPHSABS 0.7  --   --   --   MONOABS 0.6  --   --   --   EOSABS 0.2  --   --   --   BASOSABS 0.0  --   --   --     Chemistries   Recent Labs Lab 2013/08/12 0800 2013/08/12 1310 07/29/13 0622 07/30/13 0415 07/31/13 0457  NA 143  --  144 138 142  K 4.1  --  4.1 3.8 3.9  CL 100  --  106 102 105  CO2 28  --  24 23 23   GLUCOSE 152*  --  114* 134* 145*  BUN 46*  --  36* 43* 43*  CREATININE 2.16* 2.05* 1.92* 2.30* 2.02*  CALCIUM 10.2  --  9.3 9.1 9.1  AST 30  --   --   --   --   ALT 21  --   --   --   --   ALKPHOS 65  --   --   --   --   BILITOT 0.2*  --   --   --   --    ------------------------------------------------------------------------------------------------------------------ estimated creatinine clearance is 27 ml/min (by C-G formula based on Cr of 2.02). ------------------------------------------------------------------------------------------------------------------  Recent Labs  2013/08/12 1310  HGBA1C 6.5*   ------------------------------------------------------------------------------------------------------------------ No results found for this basename: CHOL, HDL, LDLCALC, TRIG, CHOLHDL, LDLDIRECT,  in the last 72  hours ------------------------------------------------------------------------------------------------------------------  Recent Labs  08/12/13 1310  TSH 2.311   ------------------------------------------------------------------------------------------------------------------  Recent Labs  08/12/13 1310  VITAMINB12 493    Coagulation profile No results found for this basename: INR, PROTIME,  in the last 168 hours  No results found for this basename: DDIMER,  in the last 72 hours  Cardiac Enzymes  Recent Labs Lab 2013/08/12 0800  TROPONINI <0.30   ------------------------------------------------------------------------------------------------------------------ No components found with this basename: POCBNP,     Seleny Allbright D.O. on 07/31/2013 at 7:40 AM  Between 7am to  7pm - Pager - [phone]  After 7pm go to [email or web address] - password [email or web address]  And look for the night coverage person covering for me after hours  [facility] Office  [phone]

## 2013-07-31 NOTE — Progress Notes (Signed)
eLink Physician-Brief Progress Note Patient Name: Lucas EavesRufus Obrien DOB: Oct 20, 1924 MRN: [id number]  Date of Service  07/31/2013   HPI/Events of Note  HTN  eICU Interventions  Lopressor changed to IV.   Intervention Category Intermediate Interventions: Hypertension - evaluation and management  Lucas Obrien 07/31/2013, 8:10 PM

## 2013-08-01 ENCOUNTER — Inpatient Hospital Stay (HOSPITAL_COMMUNITY): Payer: Medicare Other

## 2013-08-01 DIAGNOSIS — Z515 Encounter for palliative care: Secondary | ICD-10-CM

## 2013-08-01 DIAGNOSIS — J9589 Other postprocedural complications and disorders of respiratory system, not elsewhere classified: Secondary | ICD-10-CM

## 2013-08-01 DIAGNOSIS — I359 Nonrheumatic aortic valve disorder, unspecified: Secondary | ICD-10-CM

## 2013-08-01 DIAGNOSIS — J80 Acute respiratory distress syndrome: Secondary | ICD-10-CM

## 2013-08-01 DIAGNOSIS — R531 Weakness: Secondary | ICD-10-CM

## 2013-08-01 DIAGNOSIS — N179 Acute kidney failure, unspecified: Secondary | ICD-10-CM

## 2013-08-01 DIAGNOSIS — F0390 Unspecified dementia without behavioral disturbance: Secondary | ICD-10-CM

## 2013-08-01 LAB — RENAL FUNCTION PANEL
Albumin: 2.7 g/dL — ABNORMAL LOW (ref 3.5–5.2)
BUN: 39 mg/dL — ABNORMAL HIGH (ref 6–23)
CALCIUM: 9.6 mg/dL (ref 8.4–10.5)
CO2: 23 mEq/L (ref 19–32)
CREATININE: 1.62 mg/dL — AB (ref 0.50–1.35)
Chloride: 107 mEq/L (ref 96–112)
GFR calc Af Amer: 43 mL/min — ABNORMAL LOW (ref >90)
GFR, EST NON AFRICAN AMERICAN: 37 mL/min — AB (ref >90)
Glucose, Bld: 147 mg/dL — ABNORMAL HIGH (ref 70–99)
Phosphorus: 2.7 mg/dL (ref 2.3–4.6)
Potassium: 3.8 mEq/L (ref 3.7–5.3)
Sodium: 146 mEq/L (ref 137–147)

## 2013-08-01 LAB — POCT I-STAT 3, ART BLOOD GAS (G3+)
Acid-base deficit: 4 mmol/L — ABNORMAL HIGH (ref 0.0–2.0)
Acid-base deficit: 5 mmol/L — ABNORMAL HIGH (ref 0.0–2.0)
BICARBONATE: 22.3 meq/L (ref 20.0–24.0)
BICARBONATE: 21.2 meq/L (ref 20.0–24.0)
O2 Saturation: 91 %
O2 Saturation: 95 %
PCO2 ART: 43.1 mmHg (ref 35.0–45.0)
PH ART: 7.306 — AB (ref 7.350–7.450)
PO2 ART: 74 mmHg — AB (ref 80.0–100.0)
Patient temperature: 101
Patient temperature: 101
TCO2: 24 mmol/L (ref 0–100)
TCO2: 22 mmol/L (ref 0–100)
pCO2 arterial: 46.4 mmHg — ABNORMAL HIGH (ref 35.0–45.0)
pH, Arterial: 7.295 — ABNORMAL LOW (ref 7.350–7.450)
pO2, Arterial: 88 mmHg (ref 80.0–100.0)

## 2013-08-01 LAB — GLUCOSE, CAPILLARY
Glucose-Capillary: 159 mg/dL — ABNORMAL HIGH (ref 70–99)
Glucose-Capillary: 228 mg/dL — ABNORMAL HIGH (ref 70–99)
Glucose-Capillary: 181 mg/dL — ABNORMAL HIGH (ref 70–99)
Glucose-Capillary: 104 mg/dL — ABNORMAL HIGH (ref 70–99)

## 2013-08-01 LAB — STREP PNEUMONIAE URINARY ANTIGEN: Strep Pneumo Urinary Antigen: NEGATIVE

## 2013-08-01 LAB — PROCALCITONIN: Procalcitonin: 5.21 ng/mL

## 2013-08-01 MED ORDER — SODIUM CHLORIDE 0.9 % IV SOLN
0.0000 ug/h | INTRAVENOUS | Status: DC
  Filled 2013-08-01: qty 50

## 2013-08-01 MED ORDER — FENTANYL CITRATE 0.05 MG/ML IJ SOLN: 50.0000 ug | Freq: Once | INTRAMUSCULAR | Status: DC

## 2013-08-01 MED ORDER — FENTANYL CITRATE 0.05 MG/ML IJ SOLN
INTRAMUSCULAR | Status: AC
  Filled 2013-08-01: qty 4

## 2013-08-01 MED ORDER — BIOTENE DRY MOUTH MT LIQD
15.0000 mL | Freq: Two times a day (BID) | OROMUCOSAL | Status: DC
  Administered 2013-08-01 (×2): 15 mL via OROMUCOSAL

## 2013-08-01 MED ORDER — ACETAMINOPHEN 160 MG/5ML PO SOLN
650.0000 mg | Freq: Four times a day (QID) | ORAL | Status: DC | PRN
  Administered 2013-08-01 – 2013-08-02 (×2): 650 mg via ORAL
  Filled 2013-08-01 (×2): qty 20.3

## 2013-08-01 MED ORDER — SUCCINYLCHOLINE CHLORIDE 20 MG/ML IJ SOLN
INTRAMUSCULAR | Status: AC
  Filled 2013-08-01: qty 1

## 2013-08-01 MED ORDER — FENTANYL BOLUS VIA INFUSION
25.0000 ug | INTRAVENOUS | Status: DC | PRN
  Administered 2013-08-02: 50 ug via INTRAVENOUS
  Filled 2013-08-01: qty 50

## 2013-08-01 MED ORDER — MIDAZOLAM HCL 5 MG/ML IJ SOLN: 1.0000 mg | INTRAMUSCULAR | Status: DC | PRN

## 2013-08-01 MED ORDER — FUROSEMIDE 10 MG/ML IJ SOLN
40.0000 mg | Freq: Two times a day (BID) | INTRAMUSCULAR | Status: DC
  Administered 2013-08-01 – 2013-08-02 (×3): 40 mg via INTRAVENOUS
  Filled 2013-08-01 (×3): qty 4

## 2013-08-01 MED ORDER — BIOTENE DRY MOUTH MT LIQD
15.0000 mL | Freq: Four times a day (QID) | OROMUCOSAL | Status: DC
  Administered 2013-08-01 – 2013-08-02 (×5): 15 mL via OROMUCOSAL

## 2013-08-01 MED ORDER — ALBUMIN HUMAN 5 % IV SOLN: 12.5000 g | Freq: Once | INTRAVENOUS | Status: DC

## 2013-08-01 MED ORDER — VITAL HIGH PROTEIN PO LIQD
1000.0000 mL | ORAL | Status: DC
  Filled 2013-08-01 (×4): qty 1000

## 2013-08-01 MED ORDER — INSULIN ASPART 100 UNIT/ML ~~LOC~~ SOLN
2.0000 [IU] | SUBCUTANEOUS | Status: DC
  Administered 2013-08-01: 6 [IU] via SUBCUTANEOUS
  Administered 2013-08-01: 4 [IU] via SUBCUTANEOUS
  Administered 2013-08-02: 6 [IU] via SUBCUTANEOUS

## 2013-08-01 MED ORDER — CHLORHEXIDINE GLUCONATE 0.12 % MT SOLN
15.0000 mL | Freq: Two times a day (BID) | OROMUCOSAL | Status: DC
  Administered 2013-08-01: 15 mL via OROMUCOSAL
  Filled 2013-08-01: qty 15

## 2013-08-01 MED ORDER — CHLORHEXIDINE GLUCONATE 0.12 % MT SOLN
15.0000 mL | Freq: Two times a day (BID) | OROMUCOSAL | Status: DC
  Administered 2013-08-02: 15 mL via OROMUCOSAL
  Filled 2013-08-01: qty 15

## 2013-08-01 MED ORDER — OSELTAMIVIR PHOSPHATE 6 MG/ML PO SUSR
30.0000 mg | Freq: Every day | ORAL | Status: AC
  Administered 2013-08-01 – 2013-08-02 (×2): 30 mg
  Filled 2013-08-01 (×2): qty 5

## 2013-08-01 MED ORDER — ROCURONIUM BROMIDE 50 MG/5ML IV SOLN
INTRAVENOUS | Status: AC
  Administered 2013-08-01: 10 mg
  Filled 2013-08-01: qty 2

## 2013-08-01 MED ORDER — LIDOCAINE HCL (CARDIAC) 20 MG/ML IV SOLN
INTRAVENOUS | Status: AC
  Filled 2013-08-01: qty 5

## 2013-08-01 MED ORDER — ETOMIDATE 2 MG/ML IV SOLN
INTRAVENOUS | Status: AC
  Administered 2013-08-01: 20 mg
  Filled 2013-08-01: qty 20

## 2013-08-01 MED ORDER — PANTOPRAZOLE SODIUM 40 MG PO PACK
40.0000 mg | PACK | Freq: Every day | ORAL | Status: DC
  Administered 2013-08-01 – 2013-08-02 (×2): 40 mg
  Filled 2013-08-01 (×2): qty 20

## 2013-08-01 MED ORDER — MIDAZOLAM HCL 2 MG/2ML IJ SOLN
INTRAMUSCULAR | Status: AC
  Administered 2013-08-01: 2 mg
  Filled 2013-08-01: qty 4

## 2013-08-01 NOTE — Progress Notes (Addendum)
Pt not responding to commands. Pupils are not reactive. Lucas SimmondsPete Babcock, NP at bedside. New orders given and fentanyl gtt turned off.

## 2013-08-01 NOTE — Progress Notes (Signed)
Name: Lucas EavesRufus Obrien MRN: [id number] DOB: [birth year]   \PCP Neena RhymesKatherine Tabori, MD   ADMISSION DATE:  07/19/2013 CONSULTATION DATE:  07/31/13  REFERRING MD :  Dr. Catha GosselinMikhail PRIMARY SERVICE: TRH-->PCCM  CHIEF COMPLAINT:  Hypoxia   BRIEF PATIENT DESCRIPTION: 80 y/o M with multiple medical problems, baseline 2L O2 dependent, admitted 2/28 with influenza A.  Developed progressive hypoxia & R>L airspace disease on CXR.  Tx to ICU 3/3 for bipap support   has a past medical history of Hypertension; Hyperlipidemia; Urine incontinence; Alcohol problem drinking; Emphysema of lung; Type II diabetes mellitus; Chronic kidney disease; Arthritis; Gout; Dementia; UTI (lower urinary tract infection) (12/08/2011); Shortness of breath; Pneumonia (08/21/2012); Coronary artery disease; Dysrhythmia; Oxygen dependent (2L); Glaucoma; and Dementia.   has past surgical history that includes Coronary artery bypass graft (1994).   SIGNIFICANT EVENTS / STUDIES:  2/28 - Admit with influenza A  LINES / TUBES: ETT 08/01/13 planned CVL 08/01/13 - planned  CULTURES: BCx2 2/28>>> BCx1 2/28>>> UC 2/28>>>neg Sputum 3/3>>> Urine legionella and strep 08/01/13>>   ANTIBIOTICS: Levaquin 2/28 (empiric PNA)>>> Anti-infectives   Start     Dose/Rate Route Frequency Ordered Stop   07/31/13 1530  aztreonam (AZACTAM) 1 g in dextrose 5 % 50 mL IVPB     1 g 100 mL/hr over 30 Minutes Intravenous 3 times per day 07/31/13 1523     07/31/13 1400  aztreonam (AZACTAM) injection 1 g  Status:  Discontinued     1 g Intramuscular 3 times per day 07/31/13 1212 07/31/13 1523   07/31/13 1300  vancomycin (VANCOCIN) IVPB 1000 mg/200 mL premix     1,000 mg 200 mL/hr over 60 Minutes Intravenous Every 24 hours 07/31/13 1212     07/31/13 1130  levofloxacin (LEVAQUIN) IVPB 750 mg  Status:  Discontinued     750 mg 100 mL/hr over 90 Minutes Intravenous Every 48 hours 07/29/13 1530 07/31/13 1151   07/23/2013 1600  oseltamivir (TAMIFLU) capsule 30 mg      30 mg Oral Daily 07/14/2013 1556 06/01/13 0959   07/25/2013 1130  levofloxacin (LEVAQUIN) IVPB 750 mg  Status:  Discontinued     750 mg 100 mL/hr over 90 Minutes Intravenous Every 24 hours 07/09/2013 1124 07/29/13 1530   07/06/2013 0745  cefTRIAXone (ROCEPHIN) 1 g in dextrose 5 % 50 mL IVPB     1 g 100 mL/hr over 30 Minutes Intravenous  Once 07/24/2013 0730 07/26/2013 0822   07/27/2013 0745  azithromycin (ZITHROMAX) 500 mg in dextrose 5 % 250 mL IVPB     500 mg 250 mL/hr over 60 Minutes Intravenous  Once 07/22/2013 0730 07/22/2013 1016      SUBJECTIVE:   08/01/13: Sudden resp distress x 30 minutes - needs intubation. Son at bedside. Gave go ahead. Palliative care consult pending  VITAL SIGNS: Temp:  [100 F (37.8 C)-101.5 F (38.6 C)] 100.5 F (38.1 C) (03/04 0342) Pulse Rate:  [78-102] 94 (03/04 0700) Resp:  [23-43] 41 (03/04 0700) BP: (96-188)/(50-123) 170/80 mmHg (03/04 0748) SpO2:  [90 %-100 %] 95 % (03/04 0748) FiO2 (%):  [35 %-80 %] 40 % (03/04 0751) Weight:  [83.9 kg (184 lb 15.5 oz)-85 kg (187 lb 6.3 oz)] 83.9 kg (184 lb 15.5 oz) (03/04 0500) HEMODYNAMICS:   VENTILATOR SETTINGS: Vent Mode:  [-]  FiO2 (%):  [35 %-80 %] 40 % INTAKE / OUTPUT: Intake/Output     03/03 0701 - 03/04 0700 03/04 0701 - 03/05 0700   P.O. 120  I.V. (mL/kg) 130 (1.5)    IV Piggyback 350    Total Intake(mL/kg) 600 (7.2)    Urine (mL/kg/hr) 1450 (0.7)    Total Output 1450     Net -850            PHYSICAL EXAMINATION: General:  Frail elderly male, bipap Neuro:  Somewhat unresponsive. RASS -2. Moves all 4s. This is without sedation HEENT:  Mm pink/moist, no jvd Cardiovascular:  s1s2 rrr, no m/r/g Lungs:  RR 35, paradoxical, diaphoretic Abdomen:  Round/soft, bsx4 active Musculoskeletal:  No acute deformities  Skin:  Warm/dry, no edema  LABS:  PULMONARY  Recent Labs Lab 07/31/13 1045  PHART 7.382  PCO2ART 32.7*  PO2ART 45.8*  HCO3 19.0*  TCO2 20.0  O2SAT 80.5    CBC  Recent Labs Lab  07/02/2013 1310 07/29/13 0622 07/30/13 0415  HGB 9.7* 10.1* 9.7*  HCT 29.5* 31.3* 28.9*  WBC 4.8 6.5 7.7  PLT 131* 132* 117*    COAGULATION No results found for this basename: INR,  in the last 168 hours  CARDIAC   Recent Labs Lab 07/08/2013 0800 07/31/13 1052 07/31/13 1655 07/31/13 2227  TROPONINI <0.30 0.69* 0.98* 0.88*    Recent Labs Lab 07/31/13 0838  PROBNP 5048.0*     CHEMISTRY  Recent Labs Lab 07/03/2013 0800 07/15/2013 1310 07/29/13 0622 07/30/13 0415 07/31/13 0457 08/01/13 0231  NA 143  --  144 138 142 146  K 4.1  --  4.1 3.8 3.9 3.8  CL 100  --  106 102 105 107  CO2 28  --  24 23 23 23   GLUCOSE 152*  --  114* 134* 145* 147*  BUN 46*  --  36* 43* 43* 39*  CREATININE 2.16* 2.05* 1.92* 2.30* 2.02* 1.62*  CALCIUM 10.2  --  9.3 9.1 9.1 9.6  PHOS  --   --   --   --   --  2.7   Estimated Creatinine Clearance: 32.7 ml/min (by C-G formula based on Cr of 1.62).   LIVER  Recent Labs Lab 07/10/2013 0800 08/01/13 0231  AST 30  --   ALT 21  --   ALKPHOS 65  --   BILITOT 0.2*  --   PROT 8.9*  --   ALBUMIN 3.8 2.7*     INFECTIOUS  Recent Labs Lab 07/13/2013 0825 07/29/13 0622 07/31/13 1215 08/01/13 0231  LATICACIDVEN 2.31* 0.9  --   --   PROCALCITON  --   --  1.64 5.21     ENDOCRINE CBG (last 3)   Recent Labs  07/31/13 1944 07/31/13 2316 08/01/13 0344  GLUCAP 121* 119* 159*         IMAGING x48h  Dg Chest Port 1 View  07/31/2013   CLINICAL DATA:  Congestion and dyspnea  EXAM: PORTABLE CHEST - 1 VIEW  COMPARISON:  DG CHEST 2 VIEW dated 07/27/2013  FINDINGS: There has been marked progression in the consolidation of the lung parenchyma bilaterally. Confluent interstitial and alveolar infiltrates are present in the right lung. On the left in the mid and lower hemithorax increasing interstitial and alveolar densities are present. The cardiac silhouette is mildly enlarged. The pulmonary vascularity is obscured. There are 7 intact sternal wires  present.  IMPRESSION: The findings are consistent with bilateral alveolar filling process ease likely pneumonia, greater on the right than on the left. One cannot exclude an element of pulmonary edema either. There has been marked deterioration since the previous study.   Electronically Signed  By: David  [place]   On: 07/31/2013 10:43      ASSESSMENT / PLAN:  PULMONARY A: Acute Hypoxic Respiratory Failure - concern for ALI in setting of influenza vs secondary bacterial infection Influenza A Positive Bilateral Airspace Disease R>L Baseline Oxygen Dependence (2L) COPD / Former Tobacco Abuse  - 08/01/13: needs intubation  P:   -Stat intubation - ARDS protocol - VAP bundle - dc mucinex and claritin  CARDIOVASCULAR A:  CAD s/p CABG HTN HLD R/O decompensated CHF  - possible type 2 NSTEMI  P:  -assess ECHO, EKG - diurese if bp stable after intubation -trend BNP -assess enzymes, r/o cardiac event -continue  ASA,  - dc schedule lopressor and norvasc; contine lopressor prn  RENAL A:   CKD - baseline appears to be 1.4-1.7 P:   -hold nephrotoxic agents as able  GASTROINTESTINAL A:   At Risk Aspiration  P:   - PPI and tube feeds  HEMATOLOGIC A:   Thrombocytopenia Anemia  P:  -monitor platelets / CBC -thiamine  - PRBC for hgb < 8gm% in view of NSTEMI  INFECTIOUS A:   Influenza A Positive R/O HCAP - initial film clear on admit, 3/3 with significant worsening on R  P:   -abx for HCAP - check urine strep and legionella - sepsis biomarkers  ENDOCRINE A:   Hyperglycemia P:   -SSI  NEUROLOGIC A:   Dementia ? FAST STAGE but noted to be on namenda and aricept P:   -dc namenda, aricept; no role in critical illness - PAD protocol   GLOBAL 07/31/13: -Son [employer] manager) is self designated HCOPA but does not have legal paperwork (self reported).  Apparently, there are 8 children and a lot of psychosocial dynamics in regards to how decisions are made for the  patient.  Discussed possibility of intubation with son and encouraged him to talk with siblings regarding mechanical ventilation decision.     08/01/13: Jeannine Boga at bedside: due to stat intubation indication: given go ahead. Still appropriate for palliative care consult   The patient is critically ill with multiple organ systems failure and requires high complexity decision making for assessment and support, frequent evaluation and titration of therapies, application of advanced monitoring technologies and extensive interpretation of multiple databases.   Critical Care Time devoted to patient care services described in this note is  45  Minutes excluding procedure time  Dr. Kalman Shan, M.D., [facility].C.P Pulmonary and Critical Care Medicine Staff Physician [facility] [place] Pulmonary and Critical Care Pager: [phone], If no answer or between  15:00h - 7:00h: call [phone]  08/01/2013 8:57 AM

## 2013-08-01 NOTE — Progress Notes (Signed)
PT Cancellation Note  Patient Details Name: Lucas Obrien MRN: [id number] DOB: 1924-06-08   Cancelled Treatment:    Reason Eval/Treat Not Completed: Medical issues which prohibited therapy.  Will sign off as pt was getting ready to be intubated per nursing.  Nursing agreed for PT to sign off.  Please reorder if pt status improves.  Thanks.    INGOLD,Analina Filla 08/01/2013, 10:33 AM Audree Camelawn Ingold,PT Acute Rehabilitation [phone] (pager)

## 2013-08-01 NOTE — Consult Note (Signed)
I have reviewed this case with our NP and agree with the Assessment and Plan as stated.  Jullie Arps L. Tobin Cadiente, MD MBA The Palliative Medicine Team at [place] Team Phone: [phone] Pager: [phone]   

## 2013-08-01 NOTE — Progress Notes (Signed)
eLink Physician-Brief Progress Note Patient Name: Lucas EavesRufus Obrien DOB: [birth year] MRN: [id number]  Date of Service  08/01/2013   HPI/Events of Note   Fever.  eICU Interventions  Add prn tylenol.   Intervention Category Minor Interventions: Other:  Lucas Obrien 08/01/2013, 7:34 PM

## 2013-08-01 NOTE — Procedures (Signed)
Intubation Procedure Note Kayren EavesRufus Hennen [id number] August 16, 1924  Procedure: Intubation Indications: Respiratory insufficiency  Procedure Details Consent: Risks of procedure as well as the alternatives and risks of each were explained to the (patient/caregiver).  Consent for procedure obtained. Time Out: Verified patient identification, verified procedure, site/side was marked, verified correct patient position, special equipment/implants available, medications/allergies/relevent history reviewed, required imaging and test results available.  Performed  Maximum sterile technique was used including cap, gloves, hand hygiene and mask.  MAC and 3 Unable to pass #8 tube, 7.5 easily passed.     Evaluation Hemodynamic Status: BP stable throughout; O2 sats: transiently fell during during procedure Patient's Current Condition: stable Complications: No apparent complications Patient did tolerate procedure well. Chest X-ray ordered to verify placement.  CXR: pending.  Performed under direct MD supervision.   [facility],[place] 08/01/2013

## 2013-08-01 NOTE — Progress Notes (Signed)
Clinical Social Work Department BRIEF PSYCHOSOCIAL ASSESSMENT 08/01/2013  Patient:  MARCO, RAPER     Account Number:  [id number]     Admit date:  07/10/2013  Clinical Social Worker:  Freeman Caldron  Date/Time:  08/01/2013 10:25 AM  Referred by:  Physician  Date Referred:  08/01/2013 Referred for  Other - See comment  Psychosocial assessment   Other Referral:   PT recommended rehab for pt as of 07/30/13, but pt transferred to ICU on BiPap and son states pt had difficulty breathing and plan is for intubation.   Interview type:  Family Other interview type:    PSYCHOSOCIAL DATA Living Status:  FAMILY Admitted from facility:   Level of care:   Primary support name:  Temple Sporer [phone]) Primary support relationship to patient:  CHILD, ADULT Degree of support available:   Good--pt was living with his son Audry Pili and daughter-in-law Wannetta Sender prior to hospitalization and has been with them since 2012 (would be 3 years this May).    CURRENT CONCERNS Current Concerns  Post-Acute Placement  Other - See comment   Other Concerns:   Psychosocial support    SOCIAL WORK ASSESSMENT / PLAN CSW read palliative consult note for goals of care discussion at noon, MD progress notes, and PT evaluation for information about pt case. CSW met with pt's son Audry Pili in the waiting area, as pt was in cath insertion procedure in room. CSW introduced self and explained that CSW is following case and is here to support pt/family in any way CSW can. CSW explained role in discharge, balancing this with discussion about disposition and other options depending on what family believes is best plan for pt (comfort care vs vent SNF vs home with vent). CSW explained medical team works together to coordinate the best care plan for pt, and Audry Pili states he has been impressed with the care pt has received so far and thanked CSW for information. CSW emphasized that at this time, CSW does not want to overwhelm pt  with discusison about care plans, and that palliative will have a focused conversation with family this afternoon about goals. Audry Pili states he has siblings coming for the palliative meeting, and they have to think about what is best for pt. CSW reinforced that CSW will follow case and is here to support family in whatever they decide. Ricky understanding of CSW role and thanked CSW. CSW engaged Ricky in extended conversation about pt and his role as the patriarch to their large family (pt had 9 children, 1 deceased). Pt's first wife and second wife have died. 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 CSW utilized motivational interviewing and supportive counseling, in addition to active listening and reflective listening techniques.   Assessment/plan status:  Psychosocial Support/Ongoing Assessment of Needs Other assessment/ plan:   Information/referral to community resources:   ?vent SNF; psychosocial support to family and brief counseling.    PATIENT'S/FAMILY'S RESPONSE TO PLAN OF CARE: Good--pt's son Audry Pili and CSW engaged in extended conversation about pt life history and current disposition. Audry Pili thanked CSW and CSW reinforced that CSW is following case and the medical team is here to support family and assist in all ways possible to help with discharge. CSW continues to follow case, discussing with medical team and reading provider notes to stay informed on pt and family needs.       Ky Barban, MSW, [facility] Clinical Social Worker [phone]

## 2013-08-01 NOTE — Procedures (Signed)
Personally supervised procedure. Real time 2D ultrasound used for vein site selection, patency assessment, and needle entry.  A record of image was made but could not be submitted for filing due to malfunction of printing device

## 2013-08-01 NOTE — Procedures (Signed)
Central Venous Catheter Insertion Procedure Note Lucas Obrien [id number] 1925-05-30  Procedure: Insertion of Central Venous Catheter Indications: Assessment of intravascular volume, Drug and/or fluid administration and Frequent blood sampling  Procedure Details Consent: Risks of procedure as well as the alternatives and risks of each were explained to the (patient/caregiver).  Consent for procedure obtained. Time Out: Verified patient identification, verified procedure, site/side was marked, verified correct patient position, special equipment/implants available, medications/allergies/relevent history reviewed, required imaging and test results available.  Performed  Maximum sterile technique was used including antiseptics, cap, gloves, gown, hand hygiene, mask and sheet. Skin prep: Chlorhexidine; local anesthetic administered A antimicrobial bonded/coated triple lumen catheter was placed in the right internal jugular vein using the Seldinger technique.  Evaluation Blood flow good Complications: No apparent complications Patient did tolerate procedure well. Chest X-ray ordered to verify placement.  CXR: pending.   Performed under direct MD supervision using ultrasound guidance.  Wire visualized in vessel on u/s.   Placed by Lucas Obrien - PA

## 2013-08-01 NOTE — Progress Notes (Signed)
INITIAL NUTRITION ASSESSMENT  DOCUMENTATION CODES Per approved criteria  -Not Applicable   INTERVENTION: 1.  Enteral nutrition; initiate Vital High Protein @ 20 mL/hr continuous.  Advance by 10 mL q 4 hrs to 65 goal to provide 1560 kcal, 136g protein, 1294 mL free water.  NUTRITION DIAGNOSIS: Inadequate oral intake related to inability to eat as evidenced by vent, NPO  Monitor:  1.  Enteral nutrition; initiation with tolerance.  Pt to meet >/=90% estimated needs with nutrition support.  2.  Wt/wt change; monitor trends  Reason for Assessment: consult; initiation and management of TF  80 y.o. male  Admitting Dx: Respiratory failure, acute  ASSESSMENT: Pt admitted with respiratory failure. Not progressing on bi-pap, now requiring intubation.  Pt with poor PO since admission.   Patient is currently intubated on ventilator support.  MV: 8.3 L/min Temp (24hrs), Avg:100.8 F (38.2 C), Min:100 F (37.8 C), Max:101.5 F (38.6 C)  Propofol: none  Nutrition Focused Physical Exam: Subcutaneous Fat:  Orbital Region: WNL Upper Arm Region: WNL Thoracic and Lumbar Region: WNL  Muscle:  Temple Region: WNL Clavicle Bone Region: WNL Clavicle and Acromion Bone Region: WNL Scapular Bone Region: WNL Dorsal Hand: WNL Patellar Region: WNL Anterior Thigh Region: WNL Posterior Calf Region: WNL  Edema: none present  Palliative consult has also been placed for pt to direct goal of care.   Height: Ht Readings from Last 1 Encounters:  08/01/13 5\' 3"  (1.6 m)    Weight: Wt Readings from Last 1 Encounters:  08/01/13 184 lb 15.5 oz (83.9 kg)    Ideal Body Weight: 118 lbs  % Ideal Body Weight: 155%  Wt Readings from Last 10 Encounters:  08/01/13 184 lb 15.5 oz (83.9 kg)  05/16/13 186 lb 6 oz (84.539 kg)  04/18/13 191 lb 2 oz (86.694 kg)  02/08/13 194 lb 6.4 oz (88.179 kg)  01/18/13 196 lb (88.905 kg)  01/16/13 194 lb (87.998 kg)  10/03/12 194 lb 3.2 oz (88.089 kg)  09/23/12  186 lb 11.7 oz (84.7 kg)  09/07/12 192 lb 6.4 oz (87.272 kg)  08/24/12 196 lb 13.9 oz (89.3 kg)    Usual Body Weight: 195 lbs  % Usual Body Weight: 94%  BMI:  Body mass index is 32.77 kg/(m^2).  Estimated Nutritional Needs: Kcal: 1606 Protein: 119-136g Fluid: ~2.0 L/day  Skin: intact  Diet Order: NPO  EDUCATION NEEDS: -Education not appropriate at this time   Intake/Output Summary (Last 24 hours) at 08/01/13 1417 Last data filed at 08/01/13 1300  Gross per 24 hour  Intake    565 ml  Output   1450 ml  Net   -885 ml    Last BM: 2/26  Labs:   Recent Labs Lab 07/30/13 0415 07/31/13 0457 08/01/13 0231  NA 138 142 146  K 3.8 3.9 3.8  CL 102 105 107  CO2 23 23 23   BUN 43* 43* 39*  CREATININE 2.30* 2.02* 1.62*  CALCIUM 9.1 9.1 9.6  PHOS  --   --  2.7  GLUCOSE 134* 145* 147*    CBG (last 3)   Recent Labs  07/31/13 1944 07/31/13 2316 08/01/13 0344  GLUCAP 121* 119* 159*    Scheduled Meds: . antiseptic oral rinse  15 mL Mouth Rinse q12n4p  . antiseptic oral rinse  15 mL Mouth Rinse QID  . aspirin  325 mg Oral q morning - 10a  . aztreonam  1 g Intravenous 3 times per day  . chlorhexidine  15 mL Mouth  Rinse BID  . chlorhexidine  15 mL Mouth Rinse BID  . enoxaparin (LOVENOX) injection  30 mg Subcutaneous Q24H  . fentaNYL      . fentaNYL  50 mcg Intravenous Once  . furosemide  40 mg Intravenous Q12H  . insulin aspart  2-6 Units Subcutaneous 6 times per day  . ipratropium-albuterol  3 mL Nebulization Q6H  . lidocaine (cardiac) 100 mg/335ml      . oseltamivir  30 mg Oral Daily  . pantoprazole sodium  40 mg Per Tube Q1200  . succinylcholine      . thiamine  100 mg Oral Daily  . vancomycin  1,000 mg Intravenous Q24H    Continuous Infusions: . sodium chloride 10 mL/hr at 08/01/13 0600  . fentaNYL infusion INTRAVENOUS      Past Medical History  Diagnosis Date  . Hypertension   . Hyperlipidemia   . Urine incontinence   . Alcohol problem drinking    . Emphysema of lung   . Type II diabetes mellitus   . Chronic kidney disease     "moderate"  . Arthritis     "all over"  . Gout   . Dementia   . UTI (lower urinary tract infection) 12/08/2011    "first one"  . Shortness of breath   . Pneumonia 08/21/2012  . Coronary artery disease   . Dysrhythmia   . Oxygen dependent 2L  . Glaucoma   . Dementia     Past Surgical History  Procedure Laterality Date  . Coronary artery bypass graft  1994    Loyce DysKacie Sariya Trickey, MS RD LDN Clinical Inpatient Dietitian Pager: [phone] Weekend/After hours pager: [phone]

## 2013-08-01 NOTE — Procedures (Signed)
Personally supervised procedure  Dr. Kalman ShanMurali Moriya Mitchell, M.D., [facility].C.P Pulmonary and Critical Care Medicine Staff Physician [facility] [place] Pulmonary and Critical Care Pager: [phone], If no answer or between  15:00h - 7:00h: call [phone]  08/01/2013 4:56 PM

## 2013-08-01 NOTE — Progress Notes (Signed)
  Echocardiogram 2D Echocardiogram has been performed.  Arvil ChacoFoster, Mitsuo Budnick 08/01/2013, 12:23 PM

## 2013-08-01 NOTE — Consult Note (Signed)
Patient Lucas Obrien      DOB: Oct 11, 1924      AVW:[id number]     Consult Note from the Palliative Medicine Team at [facility]    Consult Requested by: Dr Marchelle Gearing     PCP: Neena Rhymes, MD Reason for Consultation: Clarification of GOC and options     Phone Number:[phone]  Assessment of patients Current state:  80 y/o AAM, former smoker, with PMH of CAD s/p CABG, HTN, HLD, DM, CKD, Dementia, Gout, DM, former ETOH abuse, 2L O2 dependent COPD who presented to [facility] ER on 2/28 with AMS, productive cough & shortness of breath. ER evaluation demonstrated fever of 101, lactate of 2.3, acute on chronic kidney injury and tachypnea. Initial CXR was negative for acute process. Patient was admitted  and treated for AECOPD.  3/3 PCCM consulted for progressive hypoxemia despite increase in supplemental oxygen. PCXR assessed and demonstrated increase in R>L bilateral airspace disease. ABG 7.38 / 32 / 45 / 19. Patient with marginal mental status, hypoxemia and increased WOB. Tx to ICU for bipap support.   Continued decline, intubated this morning for respiratory failure.  Family faced with advanced care decisions and anticipatory care needs.   This NP Lorinda Creed reviewed medical records, received report from team, assessed the patient and then meet at the patient's bedside along with his son [name] (self/family supported decision maker) and three daughter, Marcie Bal and by telephone Rene Kocher  to discuss diagnosis prognosis, GOC, EOL wishes disposition and options.   A detailed discussion was had today regarding advanced directives.  Concepts specific to code status, artifical feeding and hydration, continued IV antibiotics and rehospitalization was had.  The difference between a aggressive medical intervention path  and a palliative comfort care path for this patient at this time was had.  Values and goals of care important to patient and family were attempted to be  elicited.  Concept of Hospice and Palliative Care were discussed  Natural trajectory and expectations at EOL were discussed.  Questions and concerns addressed.  Hard Choices booklet left for review. Family encouraged to call with questions or concerns.  PMT will continue to support holistically.       Goals of Care: 1. Scope of Treatment  Continue with present medical interventions, for trial period of 24-48 hrs, family is hopeful for improvement.  No escalation of care. (no pressors or antiarrythmics) If medically indicated and patient is weaned from ventilator, DO NOT RE INTUBATE.   2. Disposition:  Dependant on outcomes  PMT will continue to support holistically and assist family with clarification of GOC dependant on outcomes.   3. Symptom Management:   Weakness/ Failure to Thrive: medical management of treatable conditions   4. Psychosocial:  Emotional support offered to family.  Today's meeting was greatly appreciated by all.  Family were able to verbalize their common interest for comfort, hope for improvement,  and the fact that [name] is the main decision maker and point person.  5. Spiritual:  Spiritual care consulted     Patient Documents Completed or Given: Document Given Completed  Advanced Directives Pkt    MOST yes   DNR    Gone from My Sight    Hard Choices yes     Brief HPI: 80 y/o AAM, former smoker, with PMH of CAD s/p CABG, HTN, HLD, DM, CKD, Dementia, Gout, DM, former ETOH abuse, 2L O2 dependent COPD who presented to [facility] ER on 2/28 with AMS, productive cough & shortness  of breath. ER evaluation demonstrated fever of 101, lactate of 2.3, acute on chronic kidney injury and tachypnea. Initial CXR was negative for acute process. Patient was admitted  and treated for AECOPD.  3/3 PCCM consulted for progressive hypoxemia despite increase in supplemental oxygen. PCXR assessed and demonstrated increase in R>L bilateral airspace disease. ABG 7.38 / 32 / 45 / 19.  Patient with marginal mental status, hypoxemia and increased WOB. Tx to ICU for bipap support.   Continued decline, intubated this morning for respiratory failure.  Family faced with advanced care decisions and anticipatory care needs.   ROS: unable to illicit, patient is intubated    PMH:  Past Medical History  Diagnosis Date  . Hypertension   . Hyperlipidemia   . Urine incontinence   . Alcohol problem drinking   . Emphysema of lung   . Type II diabetes mellitus   . Chronic kidney disease     "moderate"  . Arthritis     "all over"  . Gout   . Dementia   . UTI (lower urinary tract infection) 12/08/2011    "first one"  . Shortness of breath   . Pneumonia 08/21/2012  . Coronary artery disease   . Dysrhythmia   . Oxygen dependent 2L  . Glaucoma   . Dementia      PSH: Past Surgical History  Procedure Laterality Date  . Coronary artery bypass graft  1994   I have reviewed the FH and SH and  If appropriate update it with new information. Allergies  Allergen Reactions  . Bee Venom     Unknown  . Penicillins     Unknown reaction   Scheduled Meds: . antiseptic oral rinse  15 mL Mouth Rinse q12n4p  . antiseptic oral rinse  15 mL Mouth Rinse QID  . aspirin  325 mg Oral q morning - 10a  . aztreonam  1 g Intravenous 3 times per day  . chlorhexidine  15 mL Mouth Rinse BID  . chlorhexidine  15 mL Mouth Rinse BID  . enoxaparin (LOVENOX) injection  30 mg Subcutaneous Q24H  . fentaNYL      . fentaNYL  50 mcg Intravenous Once  . furosemide  40 mg Intravenous Q12H  . insulin aspart  2-6 Units Subcutaneous 6 times per day  . ipratropium-albuterol  3 mL Nebulization Q6H  . lidocaine (cardiac) 100 mg/865ml      . oseltamivir  30 mg Oral Daily  . pantoprazole sodium  40 mg Per Tube Q1200  . succinylcholine      . thiamine  100 mg Oral Daily  . vancomycin  1,000 mg Intravenous Q24H   Continuous Infusions: . sodium chloride 10 mL/hr at 08/01/13 0600  . fentaNYL infusion  INTRAVENOUS     PRN Meds:.albuterol, fentaNYL, metoprolol, midazolam    BP 81/37  Pulse 94  Temp(Src) 101 F (38.3 C) (Oral)  Resp 41  Ht 5\' 3"  (1.6 m)  Wt 83.9 kg (184 lb 15.5 oz)  BMI 32.77 kg/m2  SpO2 95%   PPS: 20 % at best   Intake/Output Summary (Last 24 hours) at 08/01/13 1254 Last data filed at 08/01/13 1200  Gross per 24 hour  Intake    705 ml  Output   1450 ml  Net   -745 ml    Physical Exam:  General: ill appearing, intubated HEENT:  Mm, noted ET tube Chest:   scattered course BS, bilateral equal air movement CVS: RRR Abdomen:soft NT +BS Ext:  Without edmea Neuro: sedated  Labs: CBC    Component Value Date/Time   WBC 7.7 07/30/2013 0415   RBC 3.11* 07/30/2013 0415   HGB 9.7* 07/30/2013 0415   HCT 28.9* 07/30/2013 0415   PLT 117* 07/30/2013 0415   MCV 92.9 07/30/2013 0415   MCH 31.2 07/30/2013 0415   MCHC 33.6 07/30/2013 0415   RDW 16.1* 07/30/2013 0415   LYMPHSABS 0.7 2013/08/26 0800   MONOABS 0.6 Aug 26, 2013 0800   EOSABS 0.2 26-Aug-2013 0800   BASOSABS 0.0 08/26/2013 0800    BMET    Component Value Date/Time   NA 146 08/01/2013 0231   K 3.8 08/01/2013 0231   CL 107 08/01/2013 0231   CO2 23 08/01/2013 0231   GLUCOSE 147* 08/01/2013 0231   BUN 39* 08/01/2013 0231   CREATININE 1.62* 08/01/2013 0231   CALCIUM 9.6 08/01/2013 0231   GFRNONAA 37* 08/01/2013 0231   GFRAA 43* 08/01/2013 0231    CMP     Component Value Date/Time   NA 146 08/01/2013 0231   K 3.8 08/01/2013 0231   CL 107 08/01/2013 0231   CO2 23 08/01/2013 0231   GLUCOSE 147* 08/01/2013 0231   BUN 39* 08/01/2013 0231   CREATININE 1.62* 08/01/2013 0231   CALCIUM 9.6 08/01/2013 0231   PROT 8.9* 08-26-13 0800   ALBUMIN 2.7* 08/01/2013 0231   AST 30 08/26/2013 0800   ALT 21 August 26, 2013 0800   ALKPHOS 65 August 26, 2013 0800   BILITOT 0.2* 26-Aug-2013 0800   GFRNONAA 37* 08/01/2013 0231   GFRAA 43* 08/01/2013 0231     Time In Time Out Total Time Spent with Patient Total Overall Time  1200 1330  80 min 90 min    Greater than 50%  of  this time was spent counseling and coordinating care related to the above assessment and plan.  Lorinda Creed NP  Palliative Medicine Team Team Phone # [phone] Pager [phone]  Discussed with Dirk Dress NP

## 2013-08-01 NOTE — Progress Notes (Signed)
CSW noticed PT note recommending SNF, along with consult to palliative care for goals of care discussion. CSW to check in with [facility]CM and medical team to assist with discharge planning and patient/family support.   Maryclare LabradorJulie Trasean Delima, MSW, [facility] HospitalCSWA Clinical Social Worker [phone]

## 2013-08-02 ENCOUNTER — Inpatient Hospital Stay (HOSPITAL_COMMUNITY): Payer: Medicare Other

## 2013-08-02 DIAGNOSIS — R5381 Other malaise: Secondary | ICD-10-CM

## 2013-08-02 DIAGNOSIS — R5383 Other fatigue: Secondary | ICD-10-CM

## 2013-08-02 DIAGNOSIS — R0989 Other specified symptoms and signs involving the circulatory and respiratory systems: Secondary | ICD-10-CM

## 2013-08-02 DIAGNOSIS — Z515 Encounter for palliative care: Secondary | ICD-10-CM

## 2013-08-02 DIAGNOSIS — R0609 Other forms of dyspnea: Secondary | ICD-10-CM

## 2013-08-02 DIAGNOSIS — Z66 Do not resuscitate: Secondary | ICD-10-CM

## 2013-08-02 LAB — BASIC METABOLIC PANEL
BUN: 65 mg/dL — AB (ref 6–23)
CO2: 16 mEq/L — ABNORMAL LOW (ref 19–32)
CREATININE: 3.31 mg/dL — AB (ref 0.50–1.35)
Calcium: 9.1 mg/dL (ref 8.4–10.5)
Chloride: 106 mEq/L (ref 96–112)
GFR calc Af Amer: 18 mL/min — ABNORMAL LOW (ref >90)
GFR, EST NON AFRICAN AMERICAN: 16 mL/min — AB (ref >90)
GLUCOSE: 192 mg/dL — AB (ref 70–99)
Potassium: 4.6 mEq/L (ref 3.7–5.3)
SODIUM: 147 meq/L (ref 137–147)

## 2013-08-02 LAB — LEGIONELLA ANTIGEN, URINE: Legionella Antigen, Urine: NEGATIVE

## 2013-08-02 LAB — GLUCOSE, CAPILLARY
Glucose-Capillary: 150 mg/dL — ABNORMAL HIGH (ref 70–99)
Glucose-Capillary: 211 mg/dL — ABNORMAL HIGH (ref 70–99)
Glucose-Capillary: 193 mg/dL — ABNORMAL HIGH (ref 70–99)
Glucose-Capillary: 207 mg/dL — ABNORMAL HIGH (ref 70–99)

## 2013-08-02 LAB — POCT I-STAT 3, ART BLOOD GAS (G3+)
Acid-base deficit: 3 mmol/L — ABNORMAL HIGH (ref 0.0–2.0)
Acid-base deficit: 8 mmol/L — ABNORMAL HIGH (ref 0.0–2.0)
Bicarbonate: 16.6 mEq/L — ABNORMAL LOW (ref 20.0–24.0)
Bicarbonate: 20.3 mEq/L (ref 20.0–24.0)
O2 SAT: 95 %
O2 SAT: 97 %
PCO2 ART: 30.6 mmHg — AB (ref 35.0–45.0)
PH ART: 7.356 (ref 7.350–7.450)
PH ART: 7.434 (ref 7.350–7.450)
PO2 ART: 73 mmHg — AB (ref 80.0–100.0)
Patient temperature: 99.7
TCO2: 18 mmol/L (ref 0–100)
TCO2: 21 mmol/L (ref 0–100)
pCO2 arterial: 30 mmHg — ABNORMAL LOW (ref 35.0–45.0)
pO2, Arterial: 95 mmHg (ref 80.0–100.0)

## 2013-08-02 LAB — PROCALCITONIN: Procalcitonin: 20.85 ng/mL

## 2013-08-02 MED ORDER — INSULIN ASPART 100 UNIT/ML ~~LOC~~ SOLN
0.0000 [IU] | SUBCUTANEOUS | Status: DC
  Administered 2013-08-02: 3 [IU] via SUBCUTANEOUS
  Administered 2013-08-02: 5 [IU] via SUBCUTANEOUS

## 2013-08-02 MED ORDER — LORAZEPAM 2 MG/ML IJ SOLN: 2.0000 mg | INTRAMUSCULAR | Status: DC | PRN

## 2013-08-02 MED ORDER — MORPHINE SULFATE 10 MG/ML IJ SOLN
2.0000 mg/h | INTRAVENOUS | Status: DC
  Administered 2013-08-02: 2 mg/h via INTRAVENOUS
  Administered 2013-08-02: 4 mg/h via INTRAVENOUS
  Filled 2013-08-02: qty 10

## 2013-08-02 MED ORDER — LORAZEPAM BOLUS VIA INFUSION: 2.0000 mg | INTRAVENOUS | Status: DC | PRN

## 2013-08-02 MED ORDER — VANCOMYCIN HCL IN DEXTROSE 1-5 GM/200ML-% IV SOLN: 1000.0000 mg | INTRAVENOUS | Status: DC

## 2013-08-02 MED ORDER — MORPHINE BOLUS VIA INFUSION
5.0000 mg | INTRAVENOUS | Status: DC | PRN
  Filled 2013-08-02: qty 20

## 2013-08-03 LAB — CULTURE, BLOOD (ROUTINE X 2)
Culture: NO GROWTH
Culture: NO GROWTH
Culture: NO GROWTH

## 2013-08-09 ENCOUNTER — Encounter: Payer: Medicare Other | Admitting: Family Medicine

## 2013-08-10 DIAGNOSIS — R0600 Dyspnea, unspecified: Secondary | ICD-10-CM

## 2013-08-13 NOTE — Discharge Summary (Signed)
DISCHARGE SUMMARY    Date of admit: 07/16/2013  6:21 AM Date of discharge: 07/30/2013  6:39 PM Length of Stay: 5 days  PCP is Lucas RhymesKatherine Tabori, Lucas Obrien   PROBLEM LIST Principal Problem: ACute CVA   Respiratory failure, acute Active Problems:   Hyperlipidemia   CAD (coronary artery disease)   HTN (hypertension)   DM (diabetes mellitus), type 2, uncontrolled, with renal complications, diet controlled   Dementia   Altered mental status   Fever   Elevated serum creatinine   Vertigo   CKD (chronic kidney disease), stage II   COPD (chronic obstructive pulmonary disease)   CAP (community acquired pneumonia)   AKI (acute kidney injury)   Acute respiratory failure   HCAP (healthcare-associated pneumonia)   ARDS (adult respiratory distress syndrome)   Palliative care encounter   Weakness generalized   DNAR (do not attempt resuscitation)   Dyspnea    SUMMARY Lucas Obrien was 80 y.o. patient with    has a past medical history of Hypertension; Hyperlipidemia; Urine incontinence; Alcohol problem drinking; Emphysema of lung; Type II diabetes mellitus; Chronic kidney disease; Arthritis; Gout; Dementia; UTI (lower urinary tract infection) (12/08/2011); Shortness of breath; Pneumonia (08/21/2012); Coronary artery disease; Dysrhythmia; Oxygen dependent (2L); Glaucoma; and Dementia.   has past surgical history that includes Coronary artery bypass graft (1994).   Admitted on 07/19/2013 with   80 y/o M with multiple medical problems, baseline 2L O2 dependent, admitted 2/28 with influenza A. Developed progressive hypoxia & R>L airspace disease on CXR. Tx to ICU 3/3 for bipap support and intubated 08/01/13. On evening of 08/01/13 developed acute stroke symptoms and confirmed on head Ct. Palliative care consultation was involved with family and detailed goals of care done and patient terminally weaned and he expired peacefully 08/23/2013   SIGNED Dr. Kalman ShanMurali Shelma Eiben, M.D., [facility] HospitalF.C.C.P Pulmonary  and Critical Care Medicine Staff Physician [facility] Pager: [phone], If no answer or between  15:00h - 7:00h: call [phone]  08/13/2013 11:03 AM

## 2013-08-29 NOTE — Progress Notes (Signed)
Fent gtt discontinued prior to extubation. 200ml fent wasted in sink with Suzzette RighterErin Smith, RN

## 2013-08-29 NOTE — Procedures (Signed)
Extubation Procedure Note  Patient Details:   Name: Lucas EavesRufus Sciortino DOB: [birth year] MRN: [id number]   Airway Documentation:  Airway 7.5 mm (Active)  Secured at (cm) 24 cm 08/03/2013 11:07 AM  Measured From Lips 08/03/2013 11:07 AM  Secured Location Right 07/30/2013 11:07 AM  Secured By [employer] Tube Holder 08/03/2013 11:07 AM  Tube Holder Repositioned Yes 07/31/2013 11:07 AM  Site Condition Dry 08/07/2013  3:20 AM    Evaluation  O2 sats: transiently fell during during procedure Complications: No apparent complications Patient did not tolerate procedure well. Bilateral Breath Sounds: Rhonchi Suctioning: Airway No  Terminal extubation done.  Morphine started prior to extubation, pt made comfortable.   Devra DoppGibson, Markanthony Gedney D 08/17/2013, 5:36 PM

## 2013-08-29 NOTE — Progress Notes (Signed)
At 1839, asystole noted on the cardiac monitor with absence of arterial bp waveform and absence of respirations. Patient pronounced by 2 RNs, Lucas NeuPortia Karleen Seebeck, RN and Sherril CongSara Herbert, RN with absence of apical heart tones and spontaneous respirations times 2 min.

## 2013-08-29 NOTE — Progress Notes (Signed)
RN spoke with Dr. Isabel CapriceGrapey, urologist regarding patients penile implant and need for indwelling foley cath. Dr. Isabel CapriceGrapey stated given patients medical condition and involvement of palliative care, it is safe for RN to place 114fr foley cath for end of life care.

## 2013-08-29 NOTE — Progress Notes (Signed)
Noticed that patient was not moving left upper arm when agitated with right facial droop. Rapid response nurse made aware and notified [facility].

## 2013-08-29 NOTE — Progress Notes (Signed)
Wasted 60 ml of morphine gtt 1mg /ml in sink. Witnessed by Yvone NeuPortia Payne, RN and Isabella BowensFrances Kohei Antonellis, RN.

## 2013-08-29 NOTE — Progress Notes (Signed)
CSW read palliative note that family's goal is to continue current level of care for 24-48 hours with hope of improvement, but not to escalate level of care. Goal is to keep pt as comfortable as possible. CSW following case and will continue to provide support to family as well while pt is here in hospital. If residential hospice is discussed as a discharge plan for pt, CSW will assist with this as well.   Lucas LabradorJulie Yazlynn Obrien, MSW, [facility] HospitalCSWA Clinical Social Worker [phone]

## 2013-08-29 NOTE — Progress Notes (Signed)
ANTIBIOTIC CONSULT NOTE - Follow-Up  Pharmacy Consult for Vancomycin and Aztreonam Indication: rule out pneumonia  Allergies  Allergen Reactions  . Bee Venom     Unknown  . Penicillins     Unknown reaction    Patient Measurements: Height: 5\' 3"  (160 cm) Weight: 182 lb 8.7 oz (82.8 kg) IBW/kg (Calculated) : 56.9  Vital Signs: Temp: 101.4 F (38.6 C) (03/05 0900) Temp src: Oral (03/05 0900) BP: 112/46 mmHg (03/05 1107) Pulse Rate: 128 (03/05 1107) Intake/Output from previous day: 03/04 0701 - 03/05 0700 In: 1220 [I.V.:870; IV Piggyback:350] Out: 525 [Urine:525] Intake/Output from this shift: Total I/O In: 55.7 [I.V.:55.7] Out: 100 [Urine:100]  Labs:  Recent Labs  07/31/13 0457 08/01/13 0231 06-Aug-2013 0500  CREATININE 2.02* 1.62* 3.31*   Estimated Creatinine Clearance: 15.2 ml/min (by C-G formula based on Cr of 3.31). No results found for this basename: VANCOTROUGH, VANCOPEAK, VANCORANDOM, GENTTROUGH, GENTPEAK, GENTRANDOM, TOBRATROUGH, TOBRAPEAK, TOBRARND, AMIKACINPEAK, AMIKACINTROU, AMIKACIN,  in the last 72 hours   Microbiology: Recent Results (from the past 720 hour(s))  CULTURE, BLOOD (ROUTINE X 2)     Status: None   Collection Time    07/20/2013  8:00 AM      Result Value Ref Range Status   Specimen Description BLOOD LEFT ARM   Final   Special Requests BOTTLES DRAWN AEROBIC AND ANAEROBIC 10CC   Final   Culture  Setup Time     Final   Value: 07/27/2013 16:44     Performed at [facility]   Culture     Final   Value:        BLOOD CULTURE RECEIVED NO GROWTH TO DATE CULTURE WILL BE HELD FOR 5 DAYS BEFORE ISSUING A FINAL NEGATIVE REPORT     Performed at [facility]   Report Status PENDING   Incomplete  CULTURE, BLOOD (ROUTINE X 2)     Status: None   Collection Time    07/16/2013  1:10 PM      Result Value Ref Range Status   Specimen Description BLOOD RIGHT ARM   Final   Special Requests BOTTLES DRAWN AEROBIC ONLY 10CC   Final   Culture  Setup  Time     Final   Value: 07/20/2013 23:32     Performed at [facility]   Culture     Final   Value:        BLOOD CULTURE RECEIVED NO GROWTH TO DATE CULTURE WILL BE HELD FOR 5 DAYS BEFORE ISSUING A FINAL NEGATIVE REPORT     Performed at [facility]   Report Status PENDING   Incomplete  CULTURE, BLOOD (ROUTINE X 2)     Status: None   Collection Time    07/10/2013  1:10 PM      Result Value Ref Range Status   Specimen Description BLOOD RIGHT ARM   Final   Special Requests BOTTLES DRAWN AEROBIC ONLY 10CC   Final   Culture  Setup Time     Final   Value: 07/02/2013 23:32     Performed at [facility]   Culture     Final   Value:        BLOOD CULTURE RECEIVED NO GROWTH TO DATE CULTURE WILL BE HELD FOR 5 DAYS BEFORE ISSUING A FINAL NEGATIVE REPORT     Performed at [facility]   Report Status PENDING   Incomplete  URINE CULTURE     Status: None   Collection Time  07/14/2013  4:14 PM      Result Value Ref Range Status   Specimen Description URINE, RANDOM   Final   Special Requests NONE   Final   Culture  Setup Time     Final   Value: 07/10/2013 21:42     Performed at [facility]   Colony Count     Final   Value: NO GROWTH     Performed at [facility]   Culture     Final   Value: NO GROWTH     Performed at [facility]   Report Status 07/29/2013 FINAL   Final  CULTURE, EXPECTORATED SPUTUM-ASSESSMENT     Status: None   Collection Time    07/31/13  7:41 AM      Result Value Ref Range Status   Specimen Description SPUTUM   Final   Special Requests NONE   Final   Sputum evaluation     Final   Value: MICROSCOPIC FINDINGS SUGGEST THAT THIS SPECIMEN IS NOT REPRESENTATIVE OF LOWER RESPIRATORY SECRETIONS. PLEASE RECOLLECT.   Report Status 07/31/2013 FINAL   Final  MRSA PCR SCREENING     Status: None   Collection Time    07/31/13 11:59 AM      Result Value Ref Range Status   MRSA by PCR NEGATIVE  NEGATIVE Final   Comment:             The GeneXpert MRSA Assay (FDA     approved for NASAL specimens     only), is one component of a     comprehensive MRSA colonization     surveillance program. It is not     intended to diagnose MRSA     infection nor to guide or     monitor treatment for     MRSA infections.    Medical History: Past Medical History  Diagnosis Date  . Hypertension   . Hyperlipidemia   . Urine incontinence   . Alcohol problem drinking   . Emphysema of lung   . Type II diabetes mellitus   . Chronic kidney disease     "moderate"  . Arthritis     "all over"  . Gout   . Dementia   . UTI (lower urinary tract infection) 12/08/2011    "first one"  . Shortness of breath   . Pneumonia 08/21/2012  . Coronary artery disease   . Dysrhythmia   . Oxygen dependent 2L  . Glaucoma   . Dementia     Medications:  Prescriptions prior to admission  Medication Sig Dispense Refill  . albuterol (PROAIR HFA) 108 (90 BASE) MCG/ACT inhaler Inhale 2 puffs into the lungs every 4 (four) hours as needed for wheezing.  1 Inhaler  6  . albuterol (PROVENTIL) (2.5 MG/3ML) 0.083% nebulizer solution Take 3 mLs (2.5 mg total) by nebulization every 4 (four) hours.  75 mL  12  . allopurinol (ZYLOPRIM) 300 MG tablet Take 300 mg by mouth daily.      Marland Kitchen amLODipine (NORVASC) 5 MG tablet take 1 tablet by mouth every morning  30 tablet  6  . aspirin 325 MG tablet Take 325 mg by mouth every morning.       . budesonide-formoterol (SYMBICORT) 160-4.5 MCG/ACT inhaler Inhale 2 puffs into the lungs 2 (two) times daily.      . cetirizine (ZYRTEC) 10 MG tablet Take 10 mg by mouth daily.      Marland Kitchen donepezil (ARICEPT) 5 MG tablet  take 1 tablet by mouth at bedtime  30 tablet  11  . furosemide (LASIX) 40 MG tablet take 1 tablet by mouth daily **WEIGH DAILY- IF MORE THAN 2 POUND WEIGHT GAIN, TAKE 2 TABLETS THAT MORNING**  30 tablet  5  . glipiZIDE (GLUCOTROL XL) 2.5 MG 24 hr tablet Take 1 tablet (2.5 mg total) by mouth daily with breakfast.  30  tablet  6  . ipratropium (ATROVENT) 0.06 % nasal spray instill 1 spray into each nostril three times a day  15 mL  6  . meclizine (ANTIVERT) 25 MG tablet Take 25 mg by mouth 3 (three) times daily as needed for dizziness.      . memantine (NAMENDA) 10 MG tablet Take 10 mg by mouth 2 (two) times daily.      . metoprolol (LOPRESSOR) 100 MG tablet Take 50 mg by mouth 2 (two) times daily.      . simvastatin (ZOCOR) 10 MG tablet Take 10 mg by mouth daily.      . tamsulosin (FLOMAX) 0.4 MG CAPS capsule Take 0.4 mg by mouth daily.      Marland Kitchen. thiamine (VITAMIN B-1) 100 MG tablet Take 100 mg by mouth daily.      Marland Kitchen. tiotropium (SPIRIVA) 18 MCG inhalation capsule Place 18 mcg into inhaler and inhale daily.      . valsartan-hydrochlorothiazide (DIOVAN-HCT) 320-25 MG per tablet take 1 tablet by mouth once daily  30 tablet  5  . glucose blood test strip Test twice daily as directed.  Dx 250.42  100 each  12  . ONETOUCH DELICA LANCETS 33G MISC use twice a day as directed  100 each  2   Assessment: 80 yo M admitted 01/03/2014 and [facility] to [facility] on 3[facility]3 with respiratory distress. Pharmacy was consulted to dose aztreonam and vancomycin for possible HCAP. Today is day #3 of therapy. Tmax 102.3, WBC normal on 3/2. PCT has increased to 20.85. Renal function has declined significantly since yesterday.  3/3 vanc >> 3/3 aztreonam >> 2/28 tamiflu >> 3/5  3/3 resp 2/28 urine - neg 2/28 blood x2- ngtd  Goal of Therapy:  Vancomycin trough level 15-20 mcg/ml  Plan:  - Continue Aztreonam 1g IV q8h - Change vancomycin to 1g IV q48h - Follow up renal function, Cx, WBC, and temp  Kristyl Athens A. Lenon AhmadiBinz, PharmD Clinical Pharmacist - Resident Pager: [phone] Pharmacy: [phone] 08/06/2013 12:50 PM

## 2013-08-29 NOTE — Progress Notes (Signed)
Progress Note from the Palliative Medicine Team at [facility]  Subjective:   [phone]  -patient is unresponsive to gentle touch and verbal stimuli  -son at bedside, continued discussion regarding present medical condition, indication for head CT  -he is in agreement and knows if new bleed/stroke   "at that point all we can do is keep him comfortable"  -returned to room,(1500-1600) continued conversation with family, plan is to liberate from ventilator and focus on comfort     [name] has notified family members, await Lurena Joiner (daughter) before wean  Objective: Allergies  Allergen Reactions  . Bee Venom     Unknown  . Penicillins     Unknown reaction   Scheduled Meds: . antiseptic oral rinse  15 mL Mouth Rinse QID  . aspirin  325 mg Oral q morning - 10a  . aztreonam  1 g Intravenous 3 times per day  . chlorhexidine  15 mL Mouth Rinse BID  . enoxaparin (LOVENOX) injection  30 mg Subcutaneous Q24H  . fentaNYL  50 mcg Intravenous Once  . furosemide  40 mg Intravenous Q12H  . insulin aspart  2-6 Units Subcutaneous 6 times per day  . ipratropium-albuterol  3 mL Nebulization Q6H  . oseltamivir  30 mg Per Tube Daily  . pantoprazole sodium  40 mg Per Tube Q1200  . thiamine  100 mg Oral Daily  . vancomycin  1,000 mg Intravenous Q24H   Continuous Infusions: . sodium chloride 10 mL/hr at 08/01/13 2000  . feeding supplement (VITAL HIGH PROTEIN)    . fentaNYL infusion INTRAVENOUS 25 mcg/hr (08/08/2013 0500)   PRN Meds:.acetaminophen (TYLENOL) oral liquid 160 mg/5 mL, albuterol, fentaNYL, metoprolol, midazolam  BP 131/45  Pulse 95  Temp(Src) 100.3 F (37.9 C) (Axillary)  Resp 32  Ht 5\' 3"  (1.6 m)  Wt 82.8 kg (182 lb 8.7 oz)  BMI 32.34 kg/m2  SpO2 100%      Intake/Output Summary (Last 24 hours) at 08/17/2013 0854 Last data filed at 08/26/2013 0600  Gross per 24 hour  Intake 1197.5 ml  Output    525 ml  Net  672.5 ml       Physical Exam:  General: chronically ill  apearing, intubated NAD HEENT:  Mm, no exudate, ET tube noted Chest: CTA, equal air movement CVS: tachycardic, irregular Abdomen: soft NT +BS Ext:  Without edema Neuro: unresponsive to gentle touch and verbal stimuli   Labs: CBC    Component Value Date/Time   WBC 7.7 07/30/2013 0415   RBC 3.11* 07/30/2013 0415   HGB 9.7* 07/30/2013 0415   HCT 28.9* 07/30/2013 0415   PLT 117* 07/30/2013 0415   MCV 92.9 07/30/2013 0415   MCH 31.2 07/30/2013 0415   MCHC 33.6 07/30/2013 0415   RDW 16.1* 07/30/2013 0415   LYMPHSABS 0.7 08/08/13 0800   MONOABS 0.6 Aug 08, 2013 0800   EOSABS 0.2 2013-08-08 0800   BASOSABS 0.0 August 08, 2013 0800    BMET    Component Value Date/Time   NA 147 08/22/2013 0500   K 4.6 08/22/2013 0500   CL 106 08/03/2013 0500   CO2 16* 08/28/2013 0500   GLUCOSE 192* 08/15/2013 0500   BUN 65* 08/09/2013 0500   CREATININE 3.31* 08/26/2013 0500   CALCIUM 9.1 07/29/2013 0500   GFRNONAA 16* 08/10/2013 0500   GFRAA 18* 08/11/2013 0500    CMP     Component Value Date/Time   NA 147 08/19/2013 0500   K 4.6 08/22/2013 0500   CL 106 08/25/2013 0500  CO2 16* 08/07/2013 0500   GLUCOSE 192* 08/03/2013 0500   BUN 65* 08/16/2013 0500   CREATININE 3.31* 08/24/2013 0500   CALCIUM 9.1 08/06/2013 0500   PROT 8.9* 2013-10-29 0800   ALBUMIN 2.7* 08/01/2013 0231   AST 30 2013-10-29 0800   ALT 21 2013-10-29 0800   ALKPHOS 65 2013-10-29 0800   BILITOT 0.2* 2013-10-29 0800   GFRNONAA 16* 08/15/2013 0500   GFRAA 18* 08/07/2013 0500     CT scan of the Head Reviewed/Impressions: 08/05/2013  IMPRESSION:  Large acute anterior circulation infarct, predominantly right MCA  territory. Bilateral right greater than left acute cerebellar  infarcts, and possible acute but scattered left MCA territory acute  infarcts.  The widespread distribution of these abnormalities, involving the  post circulation as well as the supratentorial compartment, is most  consistent with a shower of emboli.  At this time, there is diffuse right hemisphere swelling  without  hemorrhage, midline shift or impending herniation.    1  Assessment and Plan: 1.  DNR/DNI-comfort is main focus of care     Dyspnea/Pain:      Liberate from ventilator and focus on comfort, initiate a morphine gtt and titrate to comfort                                                                                         Ativan 1 mg IV every 4 hrs prn 2.  Psycho/Social: Emotional support offered to all family members 3.  Spiritual   Chaplain services in place 4.  Disposition: Monitor over the next 12 hrs, if stabilizes move to [facility]     Time In Time Out Total Time Spent with Patient Total Overall Time  1500 1600 60 min 60 min    Greater than 50%  of this time was spent counseling and coordinating care related to the above assessment and plan.  Lorinda CreedMary Larach NP  Palliative Medicine Team Team Phone # [phone] Pager [phone]  Discussed with Dr Marchelle Gearingamaswamy 1

## 2013-08-29 NOTE — Progress Notes (Signed)
Name: Lucas Obrien MRN: [id number] DOB: 02-Aug-1924   PCP Neena Rhymes, MD   ADMISSION DATE:  08/13/13 CONSULTATION DATE:  07/31/13  REFERRING MD :  Dr. Catha Gosselin PRIMARY SERVICE: [facility]-->PCCM  CHIEF COMPLAINT:  Hypoxia   BRIEF PATIENT DESCRIPTION: 80 y/o M with multiple medical problems, baseline 2L O2 dependent, admitted 2/28 with influenza A.  Developed progressive hypoxia & R>L airspace disease on CXR.  Tx to ICU 3/3 for bipap support   has a past medical history of Hypertension; Hyperlipidemia; Urine incontinence; Alcohol problem drinking; Emphysema of lung; Type II diabetes mellitus; Chronic kidney disease; Arthritis; Gout; Dementia; UTI (lower urinary tract infection) (12/08/2011); Shortness of breath; Pneumonia (08/21/2012); Coronary artery disease; Dysrhythmia; Oxygen dependent (2L); Glaucoma; and Dementia.   has past surgical history that includes Coronary artery bypass graft (1994).  LINES / TUBES: ETT 08/01/13 planned CVL 08/01/13 - planned  CULTURES: BCx2 2/28>>> BCx1 2/28>>> UC 2/28>>>neg Sputum 3/3>>> Urine legionella and strep 08/01/13>> neg strep   ANTIBIOTICS: Levaquin 2/28 (empiric PNA)>>> Anti-infectives   Start     Dose/Rate Route Frequency Ordered Stop   08/01/13 2000  oseltamivir (TAMIFLU) 6 MG/ML suspension 30 mg     30 mg Per Tube Daily 08/01/13 1931 08/03/13 0959   07/31/13 1530  aztreonam (AZACTAM) 1 g in dextrose 5 % 50 mL IVPB     1 g 100 mL/hr over 30 Minutes Intravenous 3 times per day 07/31/13 1523     07/31/13 1400  aztreonam (AZACTAM) injection 1 g  Status:  Discontinued     1 g Intramuscular 3 times per day 07/31/13 1212 07/31/13 1523   07/31/13 1300  vancomycin (VANCOCIN) IVPB 1000 mg/200 mL premix     1,000 mg 200 mL/hr over 60 Minutes Intravenous Every 24 hours 07/31/13 1212     07/31/13 1130  levofloxacin (LEVAQUIN) IVPB 750 mg  Status:  Discontinued     750 mg 100 mL/hr over 90 Minutes Intravenous Every 48 hours 07/29/13 1530  07/31/13 1151   08/13/2013 1600  oseltamivir (TAMIFLU) capsule 30 mg  Status:  Discontinued     30 mg Oral Daily 2013/08/13 1556 08/01/13 1930   08-13-2013 1130  levofloxacin (LEVAQUIN) IVPB 750 mg  Status:  Discontinued     750 mg 100 mL/hr over 90 Minutes Intravenous Every 24 hours Aug 13, 2013 1124 07/29/13 1530   08/13/13 0745  cefTRIAXone (ROCEPHIN) 1 g in dextrose 5 % 50 mL IVPB     1 g 100 mL/hr over 30 Minutes Intravenous  Once 13-Aug-2013 0730 08/13/2013 0822   August 13, 2013 0745  azithromycin (ZITHROMAX) 500 mg in dextrose 5 % 250 mL IVPB     500 mg 250 mL/hr over 60 Minutes Intravenous  Once 08-13-2013 0730 2013/08/13 1016      SIGNIFICANT EVENTS / STUDIES:  2/28 - Admit with influenza A 08/01/13: Sudden resp distress x 30 minutes - needs intubation. Son at bedside. Gave go ahead. Palliative care consult pending   SUBJECTIVE/OVERNIGHT/INTERVAL HX 08/12/2013: Yesterday some concern for stroke: left sided weakness/paresis with right facial droop. Currently on fentanyl IV gtt. Palliative care consult: time limited trial of basic  medical care + vent for 48h but no CPR, no reintubation, no pressors. WOrsening renal function  VITAL SIGNS: Temp:  [99.7 F (37.6 C)-102.3 F (39.1 C)] 100.3 F (37.9 C) (03/05 0308) Pulse Rate:  [34-118] 73 (03/05 0600) Resp:  [15-44] 27 (03/05 0600) BP: (81-170)/(36-80) 132/62 mmHg (03/05 0320) SpO2:  [95 %-100 %] 100 % (03/05  0600) Arterial Line BP: (95-148)/(38-55) 113/42 mmHg (03/05 0600) FiO2 (%):  [40 %-70 %] 50 % (03/05 0320) Weight:  [82.8 kg (182 lb 8.7 oz)] 82.8 kg (182 lb 8.7 oz) (03/05 0500) HEMODYNAMICS:   VENTILATOR SETTINGS: Vent Mode:  [-] PRVC FiO2 (%):  [40 %-70 %] 50 % Set Rate:  [22 bmp-30 bmp] 26 bmp Vt Set:  [340 mL-460 mL] 340 mL PEEP:  [10 cmH20] 10 cmH20 Plateau Pressure:  [15 cmH20-29 cmH20] 15 cmH20 INTAKE / OUTPUT: Intake/Output     03/04 0701 - 03/05 0700 03/05 0701 - 03/06 0700   P.O.     I.V. (mL/kg) 857.5 (10.4)    IV  Piggyback 350    Total Intake(mL/kg) 1207.5 (14.6)    Urine (mL/kg/hr) 525 (0.3)    Total Output 525     Net +682.5            PHYSICAL EXAMINATION: General:  Frail elderly male, critically ill looking Neuro:  RASS -4 on sedation gtt HEENT:  Mm pink/moist, no jvd Cardiovascular:  s1s2 rrr, no m/r/g Lungs: synch with vent. Fio2 50%, peep 10 Abdomen:  Round/soft, bsx4 active Musculoskeletal:  No acute deformities  Skin:  Warm/dry, no edema  LABS:  PULMONARY  Recent Labs Lab 07/31/13 1045 08/01/13 1026 08/01/13 1220 08/28/2013 0006 08/16/2013 0415  PHART 7.382 7.295* 7.306* 7.434 7.356  PCO2ART 32.7* 46.4* 43.1 30.6* 30.0*  PO2ART 45.8* 74.0* 88.0 73.0* 95.0  HCO3 19.0* 22.3 21.2 20.3 16.6*  TCO2 20.0 24 22 21 18   O2SAT 80.5 91.0 95.0 95.0 97.0    CBC  Recent Labs Lab 08-01-2013 1310 07/29/13 0622 07/30/13 0415  HGB 9.7* 10.1* 9.7*  HCT 29.5* 31.3* 28.9*  WBC 4.8 6.5 7.7  PLT 131* 132* 117*    COAGULATION No results found for this basename: INR,  in the last 168 hours  CARDIAC    Recent Labs Lab 08-01-2013 0800 07/31/13 1052 07/31/13 1655 07/31/13 2227  TROPONINI <0.30 0.69* 0.98* 0.88*    Recent Labs Lab 07/31/13 0838  PROBNP 5048.0*     CHEMISTRY  Recent Labs Lab 07/29/13 0622 07/30/13 0415 07/31/13 0457 08/01/13 0231 08/01/2013 0500  NA 144 138 142 146 147  K 4.1 3.8 3.9 3.8 4.6  CL 106 102 105 107 106  CO2 24 23 23 23  16*  GLUCOSE 114* 134* 145* 147* 192*  BUN 36* 43* 43* 39* 65*  CREATININE 1.92* 2.30* 2.02* 1.62* 3.31*  CALCIUM 9.3 9.1 9.1 9.6 9.1  PHOS  --   --   --  2.7  --    Estimated Creatinine Clearance: 15.2 ml/min (by C-G formula based on Cr of 3.31).   LIVER  Recent Labs Lab 08/01/13 0800 08/01/13 0231  AST 30  --   ALT 21  --   ALKPHOS 65  --   BILITOT 0.2*  --   PROT 8.9*  --   ALBUMIN 3.8 2.7*     INFECTIOUS  Recent Labs Lab August 01, 2013 0825 07/29/13 0622 07/31/13 1215 08/01/13 0231 08/01/2013 0500   LATICACIDVEN 2.31* 0.9  --   --   --   PROCALCITON  --   --  1.64 5.21 20.85     ENDOCRINE CBG (last 3)   Recent Labs  08/01/13 1943 08/01/13 2347 08/01/2013 0307  GLUCAP 104* 150* 211*         IMAGING x48h  Dg Chest Port 1 View  08/01/2013   CLINICAL DATA:  Central line placement.  EXAM: PORTABLE  CHEST - 1 VIEW  COMPARISON:  Single view of the chest 07/31/2013.  FINDINGS: The patient has a new right IJ central venous catheter with the tip projecting over the superior cavoatrial junction. Endotracheal tube is again seen. Right worse than left airspace disease persists. Aeration appears mildly improved. No pneumothorax or pleural fluid. Cardiomegaly noted.  IMPRESSION: Right IJ catheter tip projects over the superior cavoatrial junction.  Some improvement in right worse than left airspace disease compatible with edema and/or pneumonia.   Electronically Signed   By: Drusilla Kannerhomas  Dalessio M.D.   On: 08/01/2013 09:53   Dg Chest Port 1 View  07/31/2013   CLINICAL DATA:  Congestion and dyspnea  EXAM: PORTABLE CHEST - 1 VIEW  COMPARISON:  DG CHEST 2 VIEW dated 02-25-2014  FINDINGS: There has been marked progression in the consolidation of the lung parenchyma bilaterally. Confluent interstitial and alveolar infiltrates are present in the right lung. On the left in the mid and lower hemithorax increasing interstitial and alveolar densities are present. The cardiac silhouette is mildly enlarged. The pulmonary vascularity is obscured. There are 7 intact sternal wires present.  IMPRESSION: The findings are consistent with bilateral alveolar filling process ease likely pneumonia, greater on the right than on the left. One cannot exclude an element of pulmonary edema either. There has been marked deterioration since the previous study.   Electronically Signed   By: David  Swazil[place]Jordan   On: 07/31/2013 10:43   Dg Abd Portable 1v  08/01/2013   CLINICAL DATA:  80 year old male enteric tube placement. Initial  encounter.  EXAM: PORTABLE ABDOMEN - 1 VIEW  COMPARISON:  Chest radiographs 0932 hr the same day.  FINDINGS: 2 portable views of the abdomen. On the second view the tip of an enteric tube projects at the level of the gastric body. The side hole is not clearly identified. Visualized bowel gas pattern is non obstructed. Stable visualized lungs.  IMPRESSION: Enteric tube tip at the level of the gastric body.   Electronically Signed   By: Augusto GambleLee  Hall M.D.   On: 08/01/2013 16:32      ASSESSMENT / PLAN:  PULMONARY A: Acute Hypoxic Respiratory Failure - concern for ALI in setting of influenza vs secondary bacterial infection Influenza A Positive Bilateral Airspace Disease R>L Baseline Oxygen Dependence (2L) COPD / Former Tobacco Abuse  - intubated 08/01/13 for ARDS, Currently 50% fio2/peep 10  P:   - ARDS protocol - VAP bundle - dc mucinex and claritin  CARDIOVASCULAR A:  CAD s/p CABG HTN HLD R/O decompensated CHF  - possible type 2 NSTEMI, EF 45% and PASP 45  P:  - diurese to contniue -trend BNP -assess enzymes, r/o cardiac event -continue  ASA,  -  contine lopressor prn  RENAL A:   CKD - baseline appears to be 1.4-1.7   - worsening acute renal failure - AKIN 3 P:   -hold nephrotoxic agents as able - place foley  GASTROINTESTINAL A:   At Risk Aspiration  P:   - PPI and tube feeds  HEMATOLOGIC A:   Thrombocytopenia Anemia  P:  -monitor platelets / CBC -thiamine  - PRBC for hgb < 8gm% in view of NSTEMI  INFECTIOUS A:   Influenza A Positive R/O HCAP - initial film clear on admit, 3/3 with significant worsening on R   - not in shock  P:   -abx for HCAP - await  urine legionella - sepsis biomarkers  ENDOCRINE A:   Hyperglycemia P:   -SSI  NEUROLOGIC A:   Dementia ? FAST STAGE but noted to be on namenda and aricept   - ? Stroke 08/01/13 P:   -dc namenda, aricept; no role in critical illness - PAD protocol CT head to help  prognostication   GLOBAL 07/31/13: -Son [employer] manager) is self designated HCOPA but does not have legal paperwork (self reported).  Apparently, there are 8 children and a lot of psychosocial dynamics in regards to how decisions are made for the patient.  Discussed possibility of intubation with son and encouraged him to talk with siblings regarding mechanical ventilation decision.     08/01/13: DNAR with [facility] but without pressors  08/22/13: Appreciate palliative care support. Will get CT head; if bleed or big stroke immediate recs for terminal wean. Regardless in 80 year old with ARDS and renal failure, will not survive acute critical illness  The patient is critically ill with multiple organ systems failure and requires high complexity decision making for assessment and support, frequent evaluation and titration of therapies, application of advanced monitoring technologies and extensive interpretation of multiple databases.   Critical Care Time devoted to patient care services described in this note is  35  Minutes excluding procedure time  Dr. Kalman Shan, M.D., [facility].C.P Pulmonary and Critical Care Medicine Staff Physician [facility] [place] [facility] Pager: [phone], If no answer or between  15:00h - 7:00h: call [phone]  08-22-13 7:48 AM

## 2013-08-29 NOTE — Progress Notes (Signed)
Chaplain rendered emotional support to son and daughter in law of pt through his presence and by speaking compassionate words.  Chaplain will follow as needed.   08/05/2013 1400  Clinical Encounter Type  Visited With Family  Visit Type Spiritual support;Critical Care  Referral From Palliative care team  Spiritual Encounters  Spiritual Needs Emotional    Rulon Abideavid B Sherrod, chaplain pager [phone]

## 2013-08-29 DEATH — deceased

## 2015-01-27 IMAGING — DX DG ABD PORTABLE 1V
2 series · 2 of 2 positions shown · non-contrast
Comparison: Chest radiographs 4532 hr the same day.

CLINICAL DATA: 88-year-old male enteric tube placement. Initial
encounter.

EXAM:
PORTABLE ABDOMEN - 1 VIEW

[supine ap (1 of 2)]
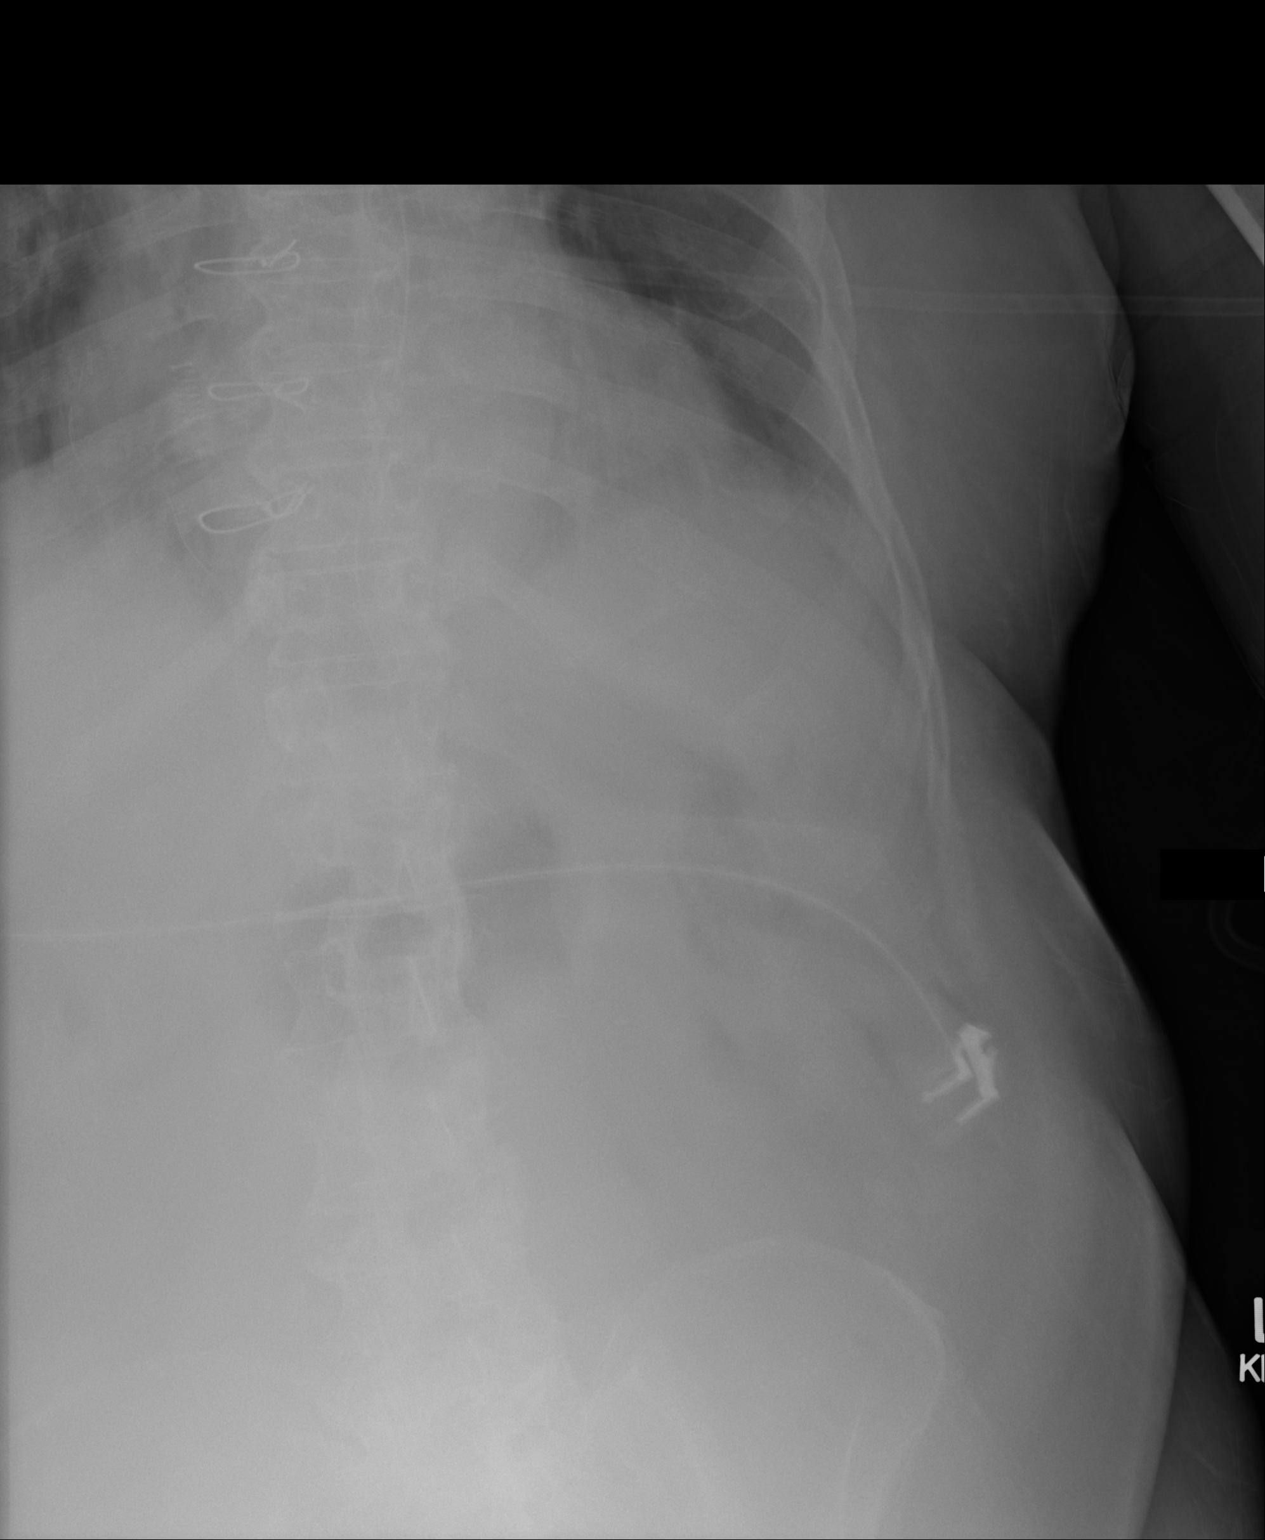

[supine ap (2 of 2)]
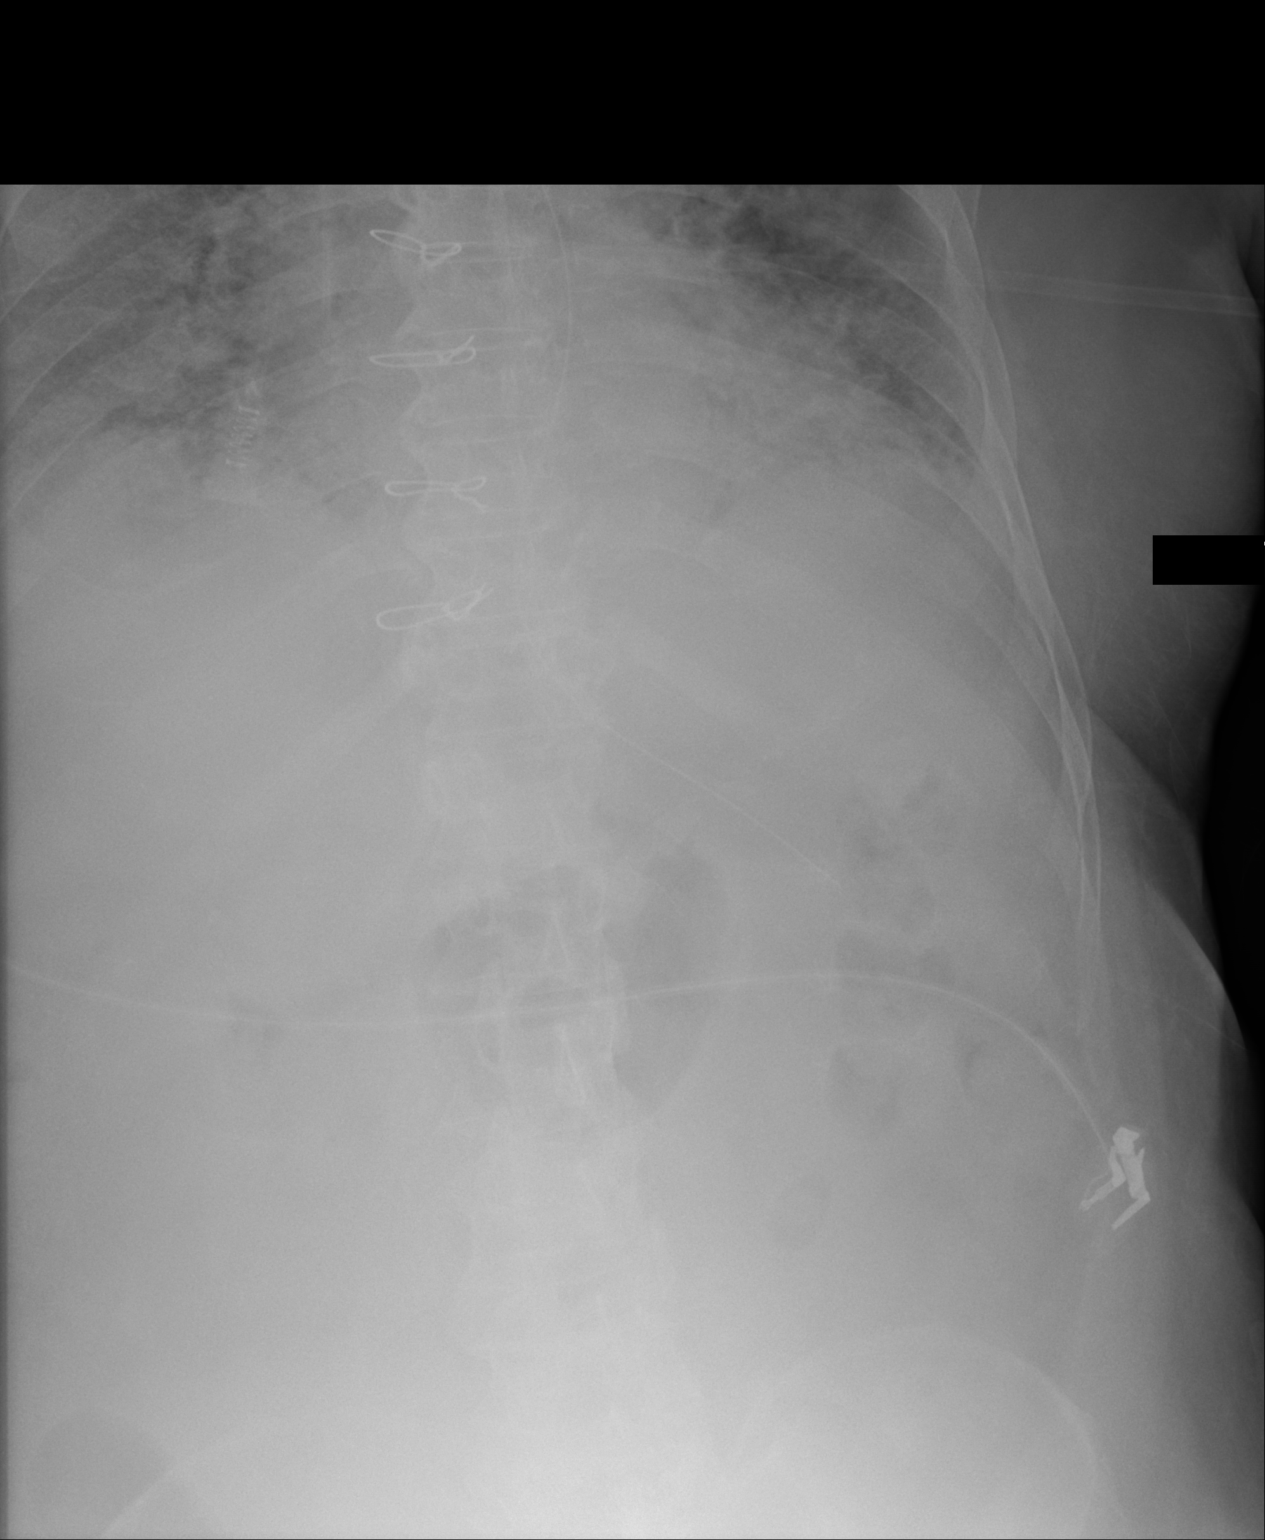

[2 of 2 positions shown; findings below may reference images not displayed]

FINDINGS: 2 portable views of the abdomen. On the second view the tip of an
enteric tube projects at the level of the gastric body. The side
hole is not clearly identified. Visualized bowel gas pattern is non
obstructed. Stable visualized lungs.
IMPRESSION: Enteric tube tip at the level of the gastric body.

## 2015-01-28 IMAGING — CT CT HEAD W/O CM
2 series · 16 of 30 positions shown, 18 images · non-contrast
Comparison: 01/07/2011.

CLINICAL DATA: Left-sided weakness. No withdrawal of the left side
to pain. Stroke risk factors include diabetes mellitus, coronary
artery disease, hypertension, and hyperlipidemia.

EXAM:
CT HEAD WITHOUT CONTRAST
TECHNIQUE: Contiguous axial images were obtained from the base of the skull
through the vertex without contrast.

[Series 2: head w/o · axial · non-contrast · 0.49mm/px · z∈[+86,+206]mm · 8 of 32 slices shown, 10 images]
[im 4/32  brain]
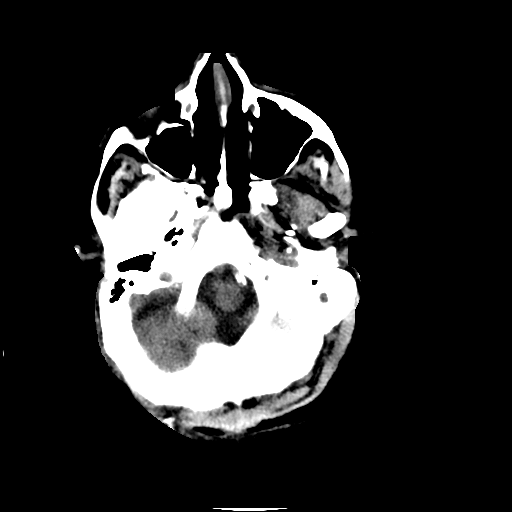
[im 4/32  bone]
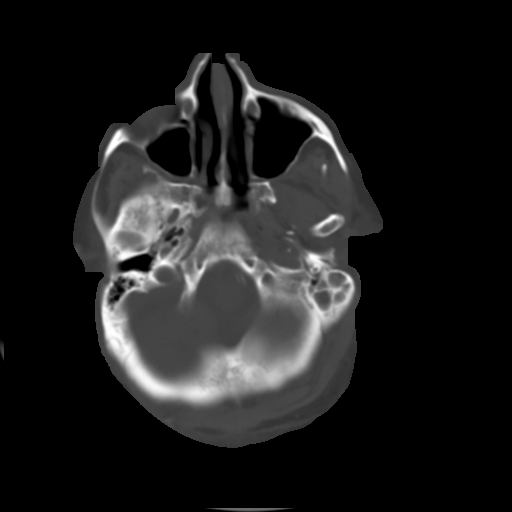
[im 7/32  brain]
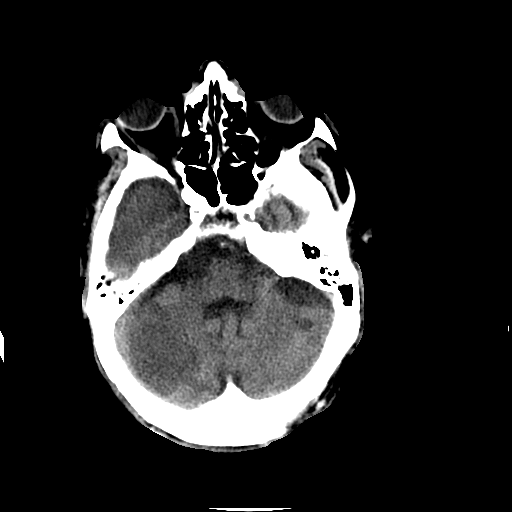
[im 11/32  brain]
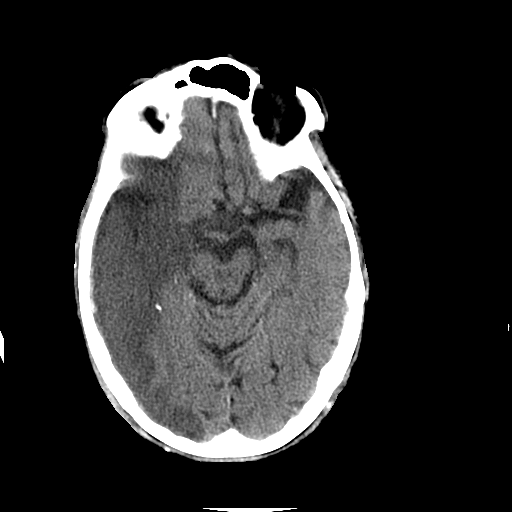
[im 14/32  brain]
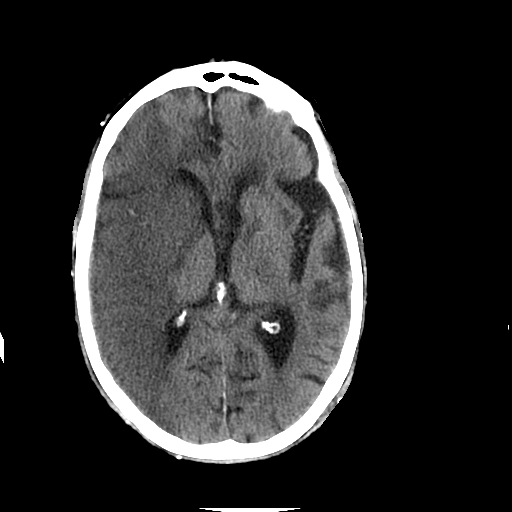
[im 18/32  brain]
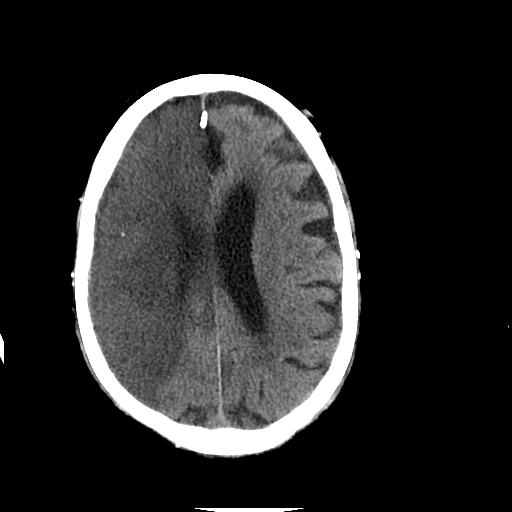
[im 18/32  bone]
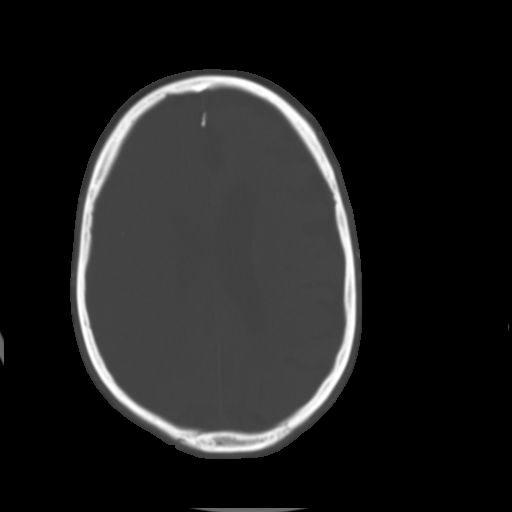
[im 21/32  brain]
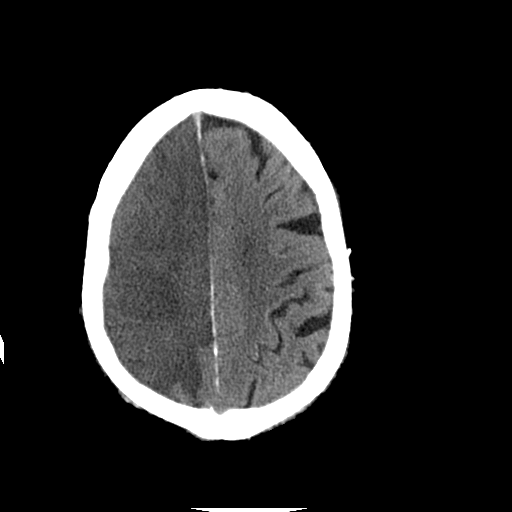
[im 25/32  brain]
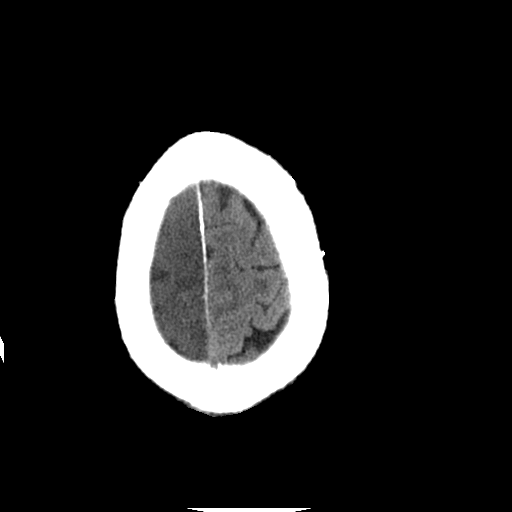
[im 28/32  brain]
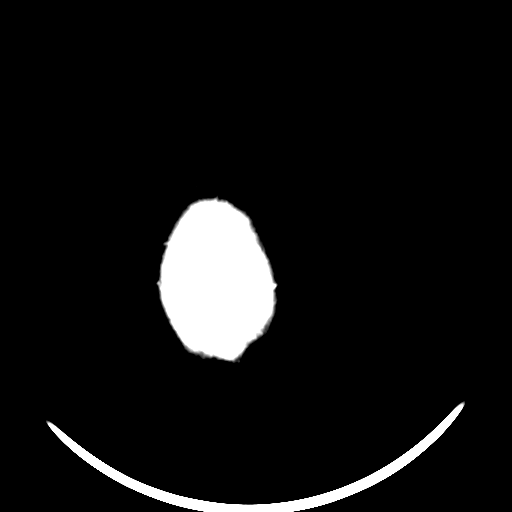

[Series 3: head w/o bone · axial · non-contrast · 0.49mm/px · z∈[+86,+209]mm · 8 of 63 slices shown]
[im 7/63  bone]
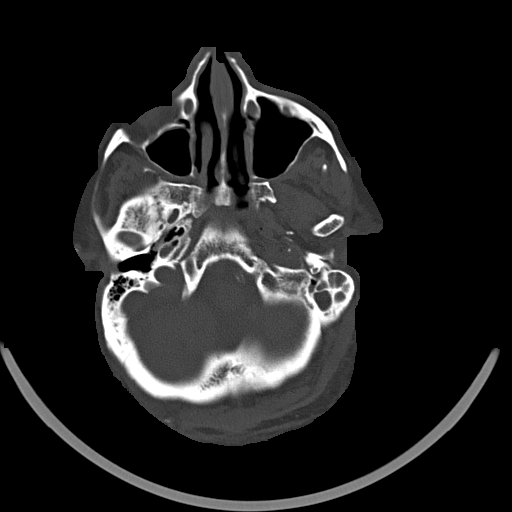
[im 14/63  bone]
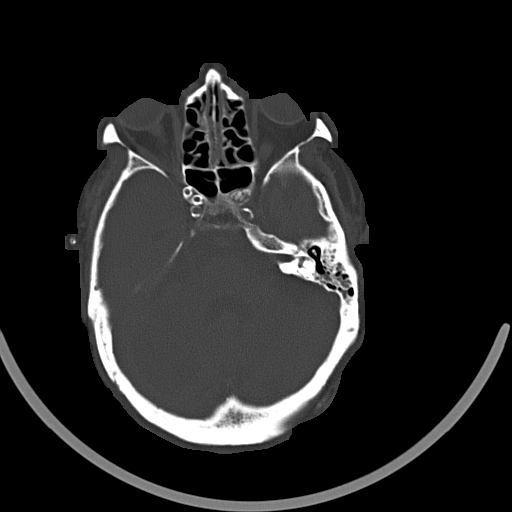
[im 20/63  bone]
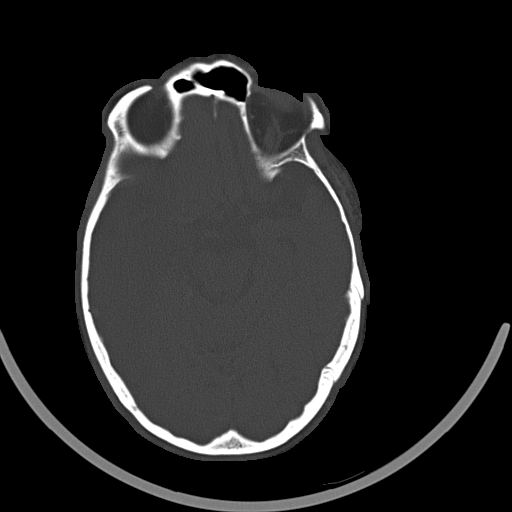
[im 27/63  bone]
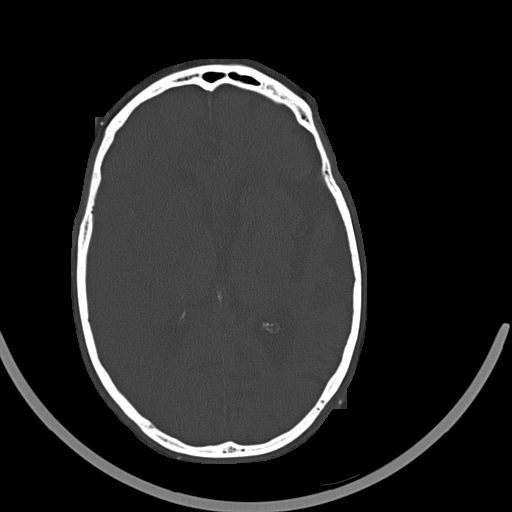
[im 36/63  bone]
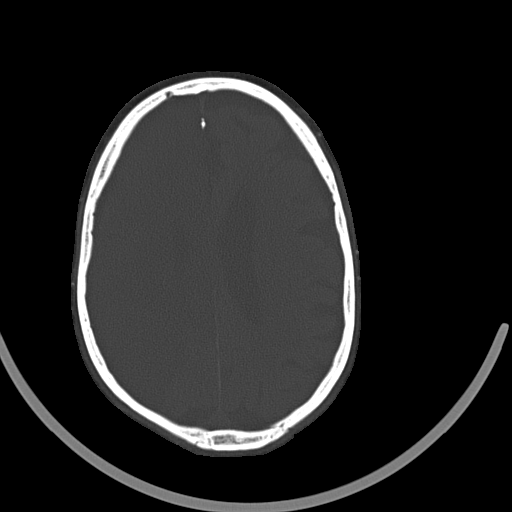
[im 43/63  bone]
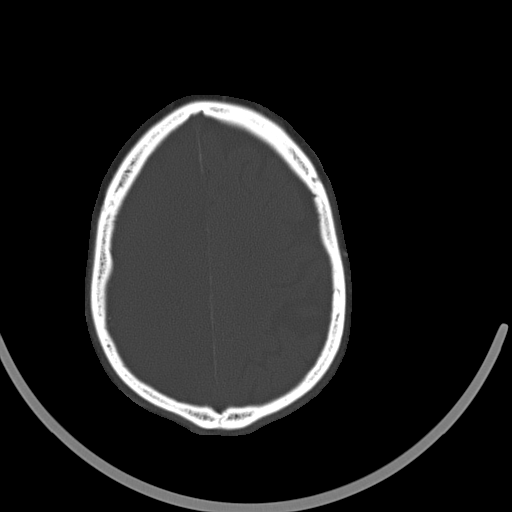
[im 49/63  bone]
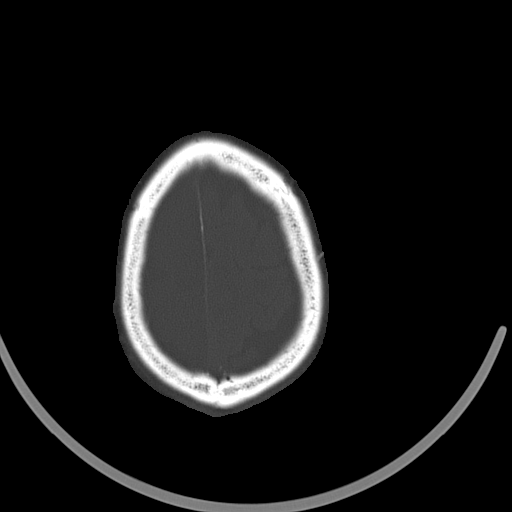
[im 56/63  bone]
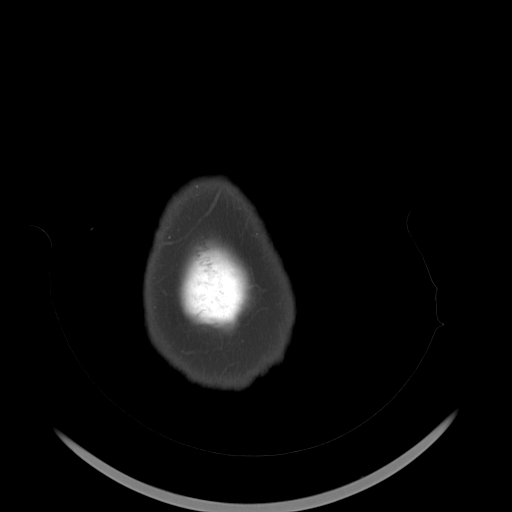

[16 of 30 positions shown; findings below may reference images not displayed]

FINDINGS: Diffuse cytotoxic edema affects much of the right hemisphere,
including the frontal, temporal, parietal, and basal ganglia
consistent with an acute predominantly right MCA territory infarct,
also affecting a portion of the distal right ACA territory. There is
no hemorrhage or midline shift, although the right hemisphere is
edematous. In addition, there is cytotoxic edema affecting the right
greater than left cerebellar hemispheres consistent with multifocal
areas of acute cerebellar infarction. Hypoattenuation of the left
basal ganglia, left subinsular region, and left temporal cortex
could suggest additional areas of acute ischemia within the left MCA
territory.

No CT signs of proximal vascular thrombosis. Calvarium intact. No
intracranial hemorrhage at this time. Marked worsening compared with
priors.

Moderately advanced atrophy with chronic microvascular ischemic
change. No extra-axial fluid collection. Marked vascular
calcification of the carotid and vertebral arteries. No osseous
lesions. No acute sinus disease. Bilateral mastoid effusions.
IMPRESSION: Large acute anterior circulation infarct, predominantly right MCA
territory. Bilateral right greater than left acute cerebellar
infarcts, and possible acute but scattered left MCA territory acute
infarcts.

The widespread distribution of these abnormalities, involving the
post circulation as well as the supratentorial compartment, is most
consistent with a shower of emboli.

At this time, there is diffuse right hemisphere swelling without
hemorrhage, midline shift or impending herniation.

These results will be called to the ordering clinician or
representative by the Radiologist Assistant, and communication
documented in the PACS Dashboard.
